# Patient Record
Sex: Male | Born: 1970 | Hispanic: No | Marital: Married | Smoking: Current some day smoker
Health system: Southern US, Community
[De-identification: ages and names within clinical notes are randomized; demographics above are authoritative.]

## PROBLEM LIST (undated history)

## (undated) DIAGNOSIS — E13319 Other specified diabetes mellitus with unspecified diabetic retinopathy without macular edema: Secondary | ICD-10-CM

## (undated) DIAGNOSIS — M48061 Spinal stenosis, lumbar region without neurogenic claudication: Secondary | ICD-10-CM

## (undated) DIAGNOSIS — J45909 Unspecified asthma, uncomplicated: Secondary | ICD-10-CM

## (undated) DIAGNOSIS — M4712 Other spondylosis with myelopathy, cervical region: Secondary | ICD-10-CM

## (undated) DIAGNOSIS — I2510 Atherosclerotic heart disease of native coronary artery without angina pectoris: Secondary | ICD-10-CM

## (undated) DIAGNOSIS — T7840XA Allergy, unspecified, initial encounter: Secondary | ICD-10-CM

## (undated) DIAGNOSIS — M940 Chondrocostal junction syndrome [Tietze]: Secondary | ICD-10-CM

## (undated) DIAGNOSIS — U071 COVID-19: Secondary | ICD-10-CM

## (undated) DIAGNOSIS — G4700 Insomnia, unspecified: Secondary | ICD-10-CM

## (undated) DIAGNOSIS — IMO0002 Reserved for concepts with insufficient information to code with codable children: Secondary | ICD-10-CM

## (undated) HISTORY — PX: TOOTH EXTRACTION: SHX859

## (undated) HISTORY — DX: Reserved for concepts with insufficient information to code with codable children: IMO0002

## (undated) HISTORY — DX: Allergy, unspecified, initial encounter: T78.40XA

## (undated) HISTORY — DX: Other specified diabetes mellitus with unspecified diabetic retinopathy without macular edema: E13.319

## (undated) HISTORY — DX: Insomnia, unspecified: G47.00

## (undated) HISTORY — DX: Spinal stenosis, lumbar region without neurogenic claudication: M48.061

## (undated) HISTORY — PX: SPHINCTEROTOMY: SHX5279

---
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
## 2002-11-20 ENCOUNTER — Emergency Department (HOSPITAL_COMMUNITY): Admission: EM | Admit: 2002-11-20 | Discharge: 2002-11-20 | Payer: Self-pay | Admitting: Podiatry

## 2009-03-23 ENCOUNTER — Emergency Department: Payer: Self-pay | Admitting: Emergency Medicine

## 2009-04-13 ENCOUNTER — Emergency Department: Payer: Self-pay | Admitting: Emergency Medicine

## 2009-05-12 ENCOUNTER — Emergency Department: Payer: Self-pay | Admitting: Emergency Medicine

## 2009-05-27 ENCOUNTER — Emergency Department: Payer: Self-pay | Admitting: Emergency Medicine

## 2009-07-26 ENCOUNTER — Emergency Department: Payer: Self-pay | Admitting: Emergency Medicine

## 2009-09-01 ENCOUNTER — Emergency Department: Payer: Self-pay | Admitting: Emergency Medicine

## 2009-10-19 ENCOUNTER — Emergency Department: Payer: Self-pay | Admitting: Unknown Physician Specialty

## 2009-10-22 ENCOUNTER — Emergency Department: Payer: Self-pay | Admitting: Emergency Medicine

## 2010-01-17 ENCOUNTER — Ambulatory Visit: Payer: Self-pay | Admitting: Family

## 2010-02-26 ENCOUNTER — Emergency Department: Payer: Self-pay | Admitting: Emergency Medicine

## 2010-03-13 ENCOUNTER — Ambulatory Visit: Payer: Self-pay | Admitting: Internal Medicine

## 2010-05-08 ENCOUNTER — Emergency Department: Payer: Self-pay | Admitting: Emergency Medicine

## 2010-06-03 ENCOUNTER — Ambulatory Visit: Payer: Self-pay | Admitting: Internal Medicine

## 2010-11-17 ENCOUNTER — Emergency Department: Payer: Self-pay | Admitting: Emergency Medicine

## 2010-11-20 HISTORY — PX: EXCISIONAL HEMORRHOIDECTOMY: SHX1541

## 2010-11-30 ENCOUNTER — Encounter
Admission: RE | Admit: 2010-11-30 | Discharge: 2010-11-30 | Payer: Self-pay | Source: Home / Self Care | Attending: Unknown Physician Specialty | Admitting: Unknown Physician Specialty

## 2011-02-09 ENCOUNTER — Ambulatory Visit: Payer: Self-pay | Admitting: Internal Medicine

## 2011-03-21 ENCOUNTER — Ambulatory Visit: Payer: Self-pay | Admitting: Unknown Physician Specialty

## 2011-04-11 ENCOUNTER — Emergency Department: Payer: Self-pay | Admitting: Emergency Medicine

## 2011-05-10 ENCOUNTER — Emergency Department: Payer: Self-pay | Admitting: Unknown Physician Specialty

## 2011-05-21 ENCOUNTER — Emergency Department: Payer: Self-pay | Admitting: Emergency Medicine

## 2011-06-07 ENCOUNTER — Emergency Department: Payer: Self-pay | Admitting: Emergency Medicine

## 2011-06-19 ENCOUNTER — Emergency Department: Payer: Self-pay | Admitting: *Deleted

## 2011-09-25 ENCOUNTER — Emergency Department: Payer: Self-pay | Admitting: Emergency Medicine

## 2011-09-29 ENCOUNTER — Ambulatory Visit: Payer: Self-pay | Admitting: Physical Medicine & Rehabilitation

## 2011-09-29 ENCOUNTER — Encounter: Payer: Worker's Compensation | Attending: Physical Medicine & Rehabilitation

## 2011-09-29 DIAGNOSIS — M545 Low back pain, unspecified: Secondary | ICD-10-CM | POA: Insufficient documentation

## 2011-09-29 DIAGNOSIS — M5137 Other intervertebral disc degeneration, lumbosacral region: Secondary | ICD-10-CM | POA: Insufficient documentation

## 2011-09-29 DIAGNOSIS — M79609 Pain in unspecified limb: Secondary | ICD-10-CM | POA: Insufficient documentation

## 2011-09-29 DIAGNOSIS — M5430 Sciatica, unspecified side: Secondary | ICD-10-CM

## 2011-09-29 DIAGNOSIS — M51379 Other intervertebral disc degeneration, lumbosacral region without mention of lumbar back pain or lower extremity pain: Secondary | ICD-10-CM | POA: Insufficient documentation

## 2011-09-29 DIAGNOSIS — M5126 Other intervertebral disc displacement, lumbar region: Secondary | ICD-10-CM | POA: Insufficient documentation

## 2011-09-29 DIAGNOSIS — E119 Type 2 diabetes mellitus without complications: Secondary | ICD-10-CM | POA: Insufficient documentation

## 2011-10-02 NOTE — Consult Note (Signed)
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  He initially was treated in the [facility], but then followed up at the [facility].  At [facility], he saw Dr. Ruthann Cancer.  He was treated with physical therapy as well as medications.  He had an MRI at [facility], Mar 21, 2011, degenerative disk disease L4-5 and L5-S1, mild neural foraminal narrowing at L5-S1.  This was referred to Dr. Golda Acre note from Apr 03, 2011.  He received tramadol as well as Flexeril for pain. His relief from Flexeril was not particularly good, tramadol was helpful, although, when he kept on it for a long period of time, it became less effective.  He was also seen by Dr. Merri Ray from Physical Medicine Rehabilitation.  He was recommended to go back to light duty work on March 30.  He had an MRI at [facility], October 19, 2010 showing small central L4-5 disk protrusion, mild central canal narrowing, shallow rightward eccentric L5-S1 disk bulge without canal or foraminal stenosis.  The patient reportedly had an FCE done some time after he saw Dr. Yves Dill.  The report is not available to me at this time.  He did have an epidural injection performed, November 30, 2010 at [facility], L4-5 translaminar approach, right paramedian.  He states that he developed numbness several hours later which made it difficult for him to walk in the grocery store with his wife.  The patient's complaints with Dr. Yves Dill were mainly axial back pain as well as radiculitis with Dr. Gerrit Heck, mainly axial back pain.  Other treatments included diclofenac which was not helpful, Celebrex mild relief, Skelaxin no relief, Flexeril per Dr. Yves Dill is good relief but then he states that today did not have good relief, tramadol some relief.  He had constipation with Percocet.  I have  also reviewed notes from [facility] in 2011.  He also had a neurosurgical evaluation with Dr. Dorian Heckle on July 10, 2011.  Surgery was not recommended. MRI was described as mild lumbar degenerative disk changes without overt compressive structural abnormality.  The patient's current pain level is 7, but averages 5, described as sharp, burning, dull, stabbing, tingling, aching.  He indicates his right leg has numbness and left leg has pain down to the foot area.  He can walk 20 minutes at a time.  He climbs steps.  He drives.  He needs help with certain household duties and addressing, now although he was able to dress and undress himself in the exam room today.  His last date of work was November 02, 2010.  REVIEW OF SYSTEMS:  Today, weakness numbness tingling, trouble walking, spasms, night sweats, weight loss, although, weight is still over 250 pounds.  PAST MEDICAL HISTORY:  Diabetes, although he states it is well controlled.  Hemoglobin A1c around 6.  SOCIAL HISTORY:  Separated.  Smokes 2 packs per day.  FAMILY HISTORY:  Heart disease, lung disease, diabetes, hypertension, drug abuse, and disability, as well as alcohol abuse.  Blood pressure 125/75, pulse 81, respirations 16, O2 saturation 97% on room air.  His opioid risk tool score based on family history of alcohol and illegal drug use as well as his age is 7, putting him at moderate risk for problematic behaviors.  PAST SURGICAL HISTORY:  Right femur fracture status post  ORIF when he was 14.  PHYSICAL EXAMINATION:  VITAL SIGNS:  Blood pressure 125/75, pulse 81, respirations 16, O2 saturation 97% on room air. GENERAL:  No acute distress.  Orientation x3.  Affect alert.  Gait is with a limp. EXTREMITIES:  Without edema.  He has decreased sensation at right L3, L4, L5 dermatomal distribution.  He has deep tendon reflexes 1+ right patellar, 2+ left patella, and 1+ bilateral  ankles. MUSCULOSKELETAL:  His straight leg raising test is negative bilaterally. Strength is normal bilateral lower extremity, hip, knee.  In ankle, range of motion are normal.  Back range of motion is normal.  He has no tenderness to palpation in lumbar paraspinal musculature.  His gait shows no evidence of toe drag or knee instability.  He is able to toe walk and heel walk.  Upper extremity strength is normal.  Neck and upper back have no tenderness to palpation.  IMPRESSION:  Mild lumbar degenerative disk, primarily axial back pain but also some sciatic type symptoms.  Given his history of diabetes, he may have superimposed neuropathy.  RECOMMENDATIONS: 1. We will check EMG and CV.  This can be done by myself either at the     [facility] or at [facility]. 2. I would like to review the FCE, but until that time I think he     would be safe for return to work at a sedentary level at least     pending my review. 3. In terms of medications, we will minimize use on more p.r.n. basis.     He states that his pain is really not on daily basis, but more     intermittent, and for this reason, we will start him on tramadol 50     mg b.i.d. and Robaxin 500 b.i.d. on the days where he does have     pain that limits his activity level.  I discussed with the patient and he agrees with plan.  I would like to get all of his MRI reports, he states he has had several, as well as the FCE will request from insurance company.     Erick Colace, M.D. Electronically Signed    AEK/MedQ D:09/29/2011 16:44:24  T:09/29/2011 20:03:06  Job #:  [id number]  cc:   Danae Orleans. Venetia Maxon, M.D. Fax: [phone]  Jefferey Pica Fax:  [phone]  Ruthann Cancer, MD

## 2011-10-25 ENCOUNTER — Encounter: Payer: Worker's Compensation | Attending: Neurosurgery | Admitting: Neurosurgery

## 2011-10-25 DIAGNOSIS — E119 Type 2 diabetes mellitus without complications: Secondary | ICD-10-CM | POA: Insufficient documentation

## 2011-10-25 DIAGNOSIS — M545 Low back pain, unspecified: Secondary | ICD-10-CM | POA: Insufficient documentation

## 2011-10-25 DIAGNOSIS — M51379 Other intervertebral disc degeneration, lumbosacral region without mention of lumbar back pain or lower extremity pain: Secondary | ICD-10-CM | POA: Insufficient documentation

## 2011-10-25 DIAGNOSIS — M5126 Other intervertebral disc displacement, lumbar region: Secondary | ICD-10-CM | POA: Insufficient documentation

## 2011-10-25 DIAGNOSIS — M79609 Pain in unspecified limb: Secondary | ICD-10-CM | POA: Insufficient documentation

## 2011-10-25 DIAGNOSIS — M5137 Other intervertebral disc degeneration, lumbosacral region: Secondary | ICD-10-CM | POA: Insufficient documentation

## 2011-10-25 DIAGNOSIS — M5106 Intervertebral disc disorders with myelopathy, lumbar region: Secondary | ICD-10-CM

## 2011-10-26 NOTE — Assessment & Plan Note (Signed)
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  He has been seen at the [facility] as well as evaluated by Dr. [name], [facility], who deemed his problem is nonsurgical.  He has had multiple MRIs, the last one in May of this year is not available for review here, but reading of Dr. [name] note, there is some mild disk bulging at L4-5 and L5-S1 that is nonoperable, no written encouragement was mentioned even though the patient says that the MRI reports of the nerve root at L5-S1 is compromised.  I do not have that documentation in the chart.  The patient states he has got an appointment with Dr. [name] on the 14th, but he asked to be seen due to his low back hurting worse.  We are not prescribing narcotics.  He rates his average pain is 7 to a 10.  It is a sharp, burning, stabbing, tingling, and aching pain.  General activity levels are 10.  Pain is worse in the morning.  Sleep patterns are poor.  All activities are aggravate.  Heat and medication helps.  He walks without assistance.  He does drive.  He did not climb steps.  He is employed as a Corporate treasurer and states he has been cleared to go back to work on a sedentary basis, but has not done so yet.  REVIEW OF SYSTEMS:  Notable for difficulties described above as well as some bladder control issues, weakness, trouble walking, spasms, high blood sugars, weight gain, night sweats, urinary retention, poor appetite, cough, shortness of breath, and wheezing.  His past medical history, social history, and family history are unchanged.  PHYSICAL EXAMINATION:  His blood pressure is 131/81, pulse 88, respirations 16, O2 sats 99 on room air.  His motor strength and sensation appeared to be intact in the lower extremities.  Straight leg raising is negative.  He does appear to be slow to rise from a seated position.  I discussed the patient's past medical evaluations with him. He  states that he is aggravated "with the whole situation."  He wants to know why he is hurting so bad.  I told him I could not explain that given his clinical findings.  Also once in a while, we will prescribe narcotics and I told him that it was also based on clinical findings and diagnostics, which did not warrant narcotics in his condition.  He has been tried on several anti-inflammatory as well as anti-spasmodic medicines in the past.  He states the oxycodone is the only thing that really helped.  He states tramadol does help, but after he "get used to it, it does not help anymore."  He states that the Robaxin does not help, but he has got Relafen onboard now.  I told him that I would switch him from the Robaxin back to [facility], which he agreed to.  We are going to refill his tramadol.  I did offer to send him to Dr. Donalee Citrin for second surgical opinion, which he reluctantly agreed to, and he states that when his EMG nerve conduction study was completed with Dr. Cherie Ouch office that he was told it was positive finding and then it was told that they "repositioned him" to get the findings as they wanted, I am not sure exactly what that means.  Reading the report is a normal EEG study.  IMPRESSION:  Mild lumbar degenerative disk disease, question musculoskeletal back pain, unknown etiology.  PLAN: 1. Refill tramadol 50 mg 1 p.o. b.i.d. p.r.n. 60 with no refill. 2. Flexeril 10 mg 1 p.o. q.8 hours p.r.n. 90 with 1 refill.  I tried     to answer all of his questions the best of my ability.  We will     follow him up as scheduled with Dr. [name].  Hopefully, I will     able to get to see Dr. [name] before that time.     Lesley Galentine L. Blima Dessert Electronically Signed    RLW/MedQ D:  10/25/2011 14:41:05  T:  10/26/2011 16:10:96  Job #:  [id number]

## 2011-11-03 ENCOUNTER — Ambulatory Visit: Payer: Worker's Compensation | Admitting: Physical Medicine & Rehabilitation

## 2011-11-07 ENCOUNTER — Emergency Department: Payer: Self-pay | Admitting: Emergency Medicine

## 2011-11-28 ENCOUNTER — Ambulatory Visit: Payer: Worker's Compensation | Admitting: Physical Medicine & Rehabilitation

## 2011-11-28 ENCOUNTER — Encounter: Payer: Worker's Compensation | Attending: Physical Medicine & Rehabilitation

## 2011-11-28 DIAGNOSIS — M51379 Other intervertebral disc degeneration, lumbosacral region without mention of lumbar back pain or lower extremity pain: Secondary | ICD-10-CM | POA: Insufficient documentation

## 2011-11-28 DIAGNOSIS — M5126 Other intervertebral disc displacement, lumbar region: Secondary | ICD-10-CM | POA: Insufficient documentation

## 2011-11-28 DIAGNOSIS — M5137 Other intervertebral disc degeneration, lumbosacral region: Secondary | ICD-10-CM | POA: Insufficient documentation

## 2011-11-28 DIAGNOSIS — M79609 Pain in unspecified limb: Secondary | ICD-10-CM | POA: Insufficient documentation

## 2011-11-28 DIAGNOSIS — M545 Low back pain, unspecified: Secondary | ICD-10-CM | POA: Insufficient documentation

## 2011-11-28 DIAGNOSIS — E119 Type 2 diabetes mellitus without complications: Secondary | ICD-10-CM | POA: Insufficient documentation

## 2011-11-28 NOTE — Assessment & Plan Note (Signed)
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  He was initially treated at [facility] and followed at the [facility].  At [facility], he saw Dr. Ruthann Cancer.  He was treated with physical therapy as well as medications.  He had a MRI [facility] Mar 21, 2011, degenerative disk disease L4-5, L5-S1, mild neural foraminal narrowing L5-S1.  He actually brought in the report from this, and I was able to review it today.  It did show neural foraminal narrowing which were mild and right greater than left side.  The patient states he has had several episodes, where his left leg seemed to get weak.  He did have an EMG/NCV per Dr. Clarisse Gouge and this showed no abnormalities.  We discussed these results at length.  The patient has had NSCE in the past, although I do not have the results of this.  He has had an epidural injection L4-5 translaminar right paramedian approach, which he indicates caused some numbness several hours later.  He has had some good relief with Flexeril in the past. Tramadol had some good relief.  He states he has not been able to tolerate Percocet due to constipation and he also mentions that it causes sexual dysfunction.  PAST MEDICAL HISTORY:  Diabetes.  Hemoglobin A1c at 6 indicating good control.  SOCIAL HISTORY:  He has complicated social situation.  He recently separated from his wife, father as well as uncle died over the last year.  Other family members have died as well and he states that this has caused some poor sleep for him.  I did actually find the FCE reviewed valid reliability.  The patient okayed for light medium duty, occasional squat, occasional stand.  He is currently employed as a Public relations account executive, but is on restricted duty.  His walking tolerance is 10 minutes.  He is able to climb steps. He is able to drive.  REVIEW OF  SYSTEMS:  Positive for weakness, numbness, tingling, trouble walking, spasms, blood sugar regulation problems, weight loss which is welcomed in his situation, coughing and wheezing.  Height 5 feet, 9 inches.  FAMILY HISTORY:  Heart disease, lung disease, diabetes and hypertension.  OBJECTIVE:  VITAL SIGNS:  Blood pressure 153/86, pulse 96, respiratory rate is 16 and O2 sat 98% on room air.  GENERAL:  No acute distress. Mood and affect appropriate. BACK:  His back has tenderness with even very light palpation of his lumbosacral spine bilaterally.  He has negative straight leg raising test.  He has normal deep tendon reflexes.  Normal strength bilateral lower extremities.  Lower extremity range of motion normal.  Gait is normal.  Back range of motion is normal except for extension which is limited.  Mood and affect are flat.  IMPRESSION: 1. Lumbar degenerative disk, mild.  No evidence of lumbar     radiculopathy to explain his lower extremity symptoms.  He may have     some radiating pain from paraspinal muscles versus disk that is not     radicular. 2. The patient lists multiple social stressors, separation from wife,     deaths of family members.  I do think he would benefit from Pain     Psychology to evaluate coping mechanisms. 3. In terms of pain medications thus far, his best combination     medications have been tramadol and Flexeril.  We  will increase his tramadol 50 mg 2 p.o. t.i.d. p.r.n. as well as     Flexeril 10 mg p.o. t.i.d. p.r.n. I will see him back in about 6     weeks.  He will continue his current work activities.  Discussed     with the patient, agrees with plan.     Erick Colace, M.D. Electronically Signed    AEK/MedQ D:  11/28/2011 16:31:56  T:  11/28/2011 22:53:47  Job #:  [id number]  cc:   Danae Orleans. Venetia Maxon, M.D. Fax: [phone]

## 2011-12-03 ENCOUNTER — Emergency Department: Payer: Self-pay | Admitting: Emergency Medicine

## 2011-12-19 ENCOUNTER — Emergency Department: Payer: Self-pay | Admitting: Emergency Medicine

## 2012-01-08 ENCOUNTER — Encounter: Payer: Worker's Compensation | Attending: Physical Medicine & Rehabilitation

## 2012-01-08 ENCOUNTER — Ambulatory Visit: Payer: Worker's Compensation | Admitting: Physical Medicine & Rehabilitation

## 2012-01-08 DIAGNOSIS — M5137 Other intervertebral disc degeneration, lumbosacral region: Secondary | ICD-10-CM

## 2012-01-08 DIAGNOSIS — M545 Low back pain, unspecified: Secondary | ICD-10-CM | POA: Insufficient documentation

## 2012-01-08 DIAGNOSIS — M5126 Other intervertebral disc displacement, lumbar region: Secondary | ICD-10-CM | POA: Insufficient documentation

## 2012-01-08 DIAGNOSIS — M79609 Pain in unspecified limb: Secondary | ICD-10-CM | POA: Insufficient documentation

## 2012-01-08 DIAGNOSIS — M51379 Other intervertebral disc degeneration, lumbosacral region without mention of lumbar back pain or lower extremity pain: Secondary | ICD-10-CM | POA: Insufficient documentation

## 2012-01-08 DIAGNOSIS — E119 Type 2 diabetes mellitus without complications: Secondary | ICD-10-CM | POA: Insufficient documentation

## 2012-01-08 NOTE — Assessment & Plan Note (Signed)
Joel Page is a 41 year old male with back pain as well as lower extremity pain.  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  He was treated initially at the [facility] followed at the [facility]  subsequently, Dr. Gerrit Heck from Spine surgery saw him.  No surgical recommendations were made.  He was seen by physical medicine and rehabilitation, Dr. Merri Ray.  Okayed for light duty work on February 17, 2011.  MRI imaging was performed on October 19, 2010, as well as May of 2012.  Has had an FCE done as well.  He did have an epidural injection done at [facility] L4-5 translaminar injection right paramedian approach.  He had Neurosurgical evaluation on July 10, 2011.  No surgery was recommended.  I reviewed his MRIs, no significant stenotic lesions on his lumbar MRI, dated Mar 21, 2011.  Degenerative disk at L4-5, mild broad-based disk bulging, some lateralization of the L5-S1 disk bulged to the right versus the left side.  His current complaints, he feels like his right lower extremity is getting more painful.  His left lower extremity has a tired feeling.  He is now experiencing some nighttime pain.  He generally gets good relief from his tramadol 50 two p.o. b.i.d. as well as Flexeril 10 mg t.i.d. p.r.n., but his nighttime pain is something that is bothering him more. He has some problems with household duties.  Otherwise, independent, has some chronic intermittent urinary frequency problems.  SOCIAL HISTORY:  Single, lives alone, smokes.  PHYSICAL EXAMINATION:  VITAL SIGNS:  Blood pressure 125/60, pulse 91, respirations 16, weight 248 pounds, height 5 feet, 9 inches, overweight male, in no acute distress. GENERAL:  Orientation x3.  Affect is alert. MUSCULOSKELETAL:  Gait is with a limp.  His lower extremity strength is 5/5 in the hip flexors, knee extensors.  He has some giveaway weakness, ankle dorsiflexor on the right as well as the EHL  on the left side is normal.  He has pain that goes down his back of the thigh with SLR on the right side in the seated position.  Negative bowstring sign. Negative contralateral SLR.  Hip, knee, and ankle range of motion are full.  Gait is normal.  He has tenderness to palpation with even very light palpation in the lumbar spine.  IMPRESSION: 1. Lumbar pain, lumbar degenerative disk.  Previous imaging studies     and EMG showed no evidence of nerve root compromise.  He has some     increased subjective symptoms at the current time.  We will address     with medications change.  We will add nortriptyline 25 at bedtime,     this should help with sleep as well as pain of chronic nature. 2. Continue his tramadol. 3. Continue Flexeril. 4. I will see him back in a month.  If he is really not feeling any     better, we will repeat MRI.  There was no changes between 2011 and     2012.  We will repeat again if no better with medication change.     Erick Colace, M.D. Electronically Signed   AEK/MedQ D:  01/08/2012 13:19:20  T:  01/08/2012 21:07:08  Job #:  [id number]  cc:   [employer] [phone] Lanetta Inch

## 2012-01-11 ENCOUNTER — Ambulatory Visit: Payer: Self-pay | Admitting: Surgery

## 2012-01-11 LAB — BASIC METABOLIC PANEL
Anion Gap: 11 (ref 7–16)
BUN: 10 mg/dL (ref 7–18)
Calcium, Total: 8.9 mg/dL (ref 8.5–10.1)
Chloride: 105 mmol/L (ref 98–107)
EGFR (Non-African Amer.): >60
Glucose: 125 mg/dL — ABNORMAL HIGH (ref 65–99)
Osmolality: 284 (ref 275–301)

## 2012-01-11 LAB — CBC WITH DIFFERENTIAL/PLATELET
Basophil #: 0.1 10*3/uL (ref 0.0–0.1)
Eosinophil %: 1.7 %
HCT: 44.3 % (ref 40.0–52.0)
HGB: 15.1 g/dL (ref 13.0–18.0)
Lymphocyte #: 2.2 10*3/uL (ref 1.0–3.6)
Lymphocyte %: 18.4 %
MCHC: 34.1 g/dL (ref 32.0–36.0)
MCV: 87 fL (ref 80–100)
Monocyte #: 0.7 10*3/uL (ref 0.0–0.7)
Monocyte %: 5.4 %
Neutrophil %: 73.6 %
Platelet: 164 10*3/uL (ref 150–440)
RBC: 5.1 10*6/uL (ref 4.40–5.90)
RDW: 13.9 % (ref 11.5–14.5)
WBC: 12.1 10*3/uL — ABNORMAL HIGH (ref 3.8–10.6)

## 2012-01-16 ENCOUNTER — Emergency Department: Payer: Self-pay | Admitting: Emergency Medicine

## 2012-02-05 ENCOUNTER — Ambulatory Visit: Payer: Self-pay | Admitting: Surgery

## 2012-02-06 ENCOUNTER — Encounter: Payer: Worker's Compensation | Attending: Physical Medicine & Rehabilitation

## 2012-02-06 ENCOUNTER — Ambulatory Visit: Payer: Worker's Compensation | Admitting: Physical Medicine & Rehabilitation

## 2012-02-06 DIAGNOSIS — M545 Low back pain, unspecified: Secondary | ICD-10-CM | POA: Insufficient documentation

## 2012-02-06 DIAGNOSIS — M5137 Other intervertebral disc degeneration, lumbosacral region: Secondary | ICD-10-CM | POA: Insufficient documentation

## 2012-02-06 DIAGNOSIS — E119 Type 2 diabetes mellitus without complications: Secondary | ICD-10-CM | POA: Insufficient documentation

## 2012-02-06 DIAGNOSIS — M51379 Other intervertebral disc degeneration, lumbosacral region without mention of lumbar back pain or lower extremity pain: Secondary | ICD-10-CM | POA: Insufficient documentation

## 2012-02-06 DIAGNOSIS — M79609 Pain in unspecified limb: Secondary | ICD-10-CM | POA: Insufficient documentation

## 2012-02-06 DIAGNOSIS — M5126 Other intervertebral disc displacement, lumbar region: Secondary | ICD-10-CM | POA: Insufficient documentation

## 2012-03-19 ENCOUNTER — Encounter: Payer: Worker's Compensation | Attending: Physical Medicine & Rehabilitation

## 2012-03-19 ENCOUNTER — Encounter: Payer: Self-pay | Admitting: Physical Medicine & Rehabilitation

## 2012-03-19 ENCOUNTER — Ambulatory Visit (HOSPITAL_BASED_OUTPATIENT_CLINIC_OR_DEPARTMENT_OTHER): Payer: Worker's Compensation | Admitting: Physical Medicine & Rehabilitation

## 2012-03-19 VITALS — BP 114/73 | HR 97 | Ht 69.0 in | Wt 232.0 lb

## 2012-03-19 DIAGNOSIS — M5137 Other intervertebral disc degeneration, lumbosacral region: Secondary | ICD-10-CM

## 2012-03-19 DIAGNOSIS — M5430 Sciatica, unspecified side: Secondary | ICD-10-CM | POA: Insufficient documentation

## 2012-03-19 DIAGNOSIS — E119 Type 2 diabetes mellitus without complications: Secondary | ICD-10-CM | POA: Insufficient documentation

## 2012-03-19 DIAGNOSIS — M545 Low back pain, unspecified: Secondary | ICD-10-CM | POA: Insufficient documentation

## 2012-03-19 DIAGNOSIS — M51379 Other intervertebral disc degeneration, lumbosacral region without mention of lumbar back pain or lower extremity pain: Secondary | ICD-10-CM | POA: Insufficient documentation

## 2012-03-19 DIAGNOSIS — M5126 Other intervertebral disc displacement, lumbar region: Secondary | ICD-10-CM | POA: Insufficient documentation

## 2012-03-19 DIAGNOSIS — M79609 Pain in unspecified limb: Secondary | ICD-10-CM | POA: Insufficient documentation

## 2012-03-19 DIAGNOSIS — M5136 Other intervertebral disc degeneration, lumbar region: Secondary | ICD-10-CM | POA: Insufficient documentation

## 2012-03-19 DIAGNOSIS — M51369 Other intervertebral disc degeneration, lumbar region without mention of lumbar back pain or lower extremity pain: Secondary | ICD-10-CM | POA: Insufficient documentation

## 2012-03-19 MED ORDER — TRAMADOL HCL 50 MG PO TABS: 100.0000 mg | ORAL_TABLET | Freq: Three times a day (TID) | ORAL | Status: DC | PRN

## 2012-03-19 MED ORDER — TRAZODONE HCL 50 MG PO TABS: 50.0000 mg | ORAL_TABLET | Freq: Every day | ORAL | Status: DC

## 2012-03-19 MED ORDER — CYCLOBENZAPRINE HCL 10 MG PO TABS: 10.0000 mg | ORAL_TABLET | Freq: Three times a day (TID) | ORAL | Status: DC | PRN

## 2012-03-19 NOTE — Patient Instructions (Addendum)
Discontinue the nortriptyline due to the urinary symptoms Trazodone will be substituted for sleep as well as for pain. Continue tramadol 2 tablets 3 times a day Continue Flexeril 10 mg every 8 hours as needed. Do not take this if you don't have muscle spasms

## 2012-03-19 NOTE — Progress Notes (Signed)
Subjective:    Patient ID: Joel Page, male    DOB: Jan 10, 1971, 41 y.o.   MRN: [id number]  HPI  [name] is a 41 year old male with back pain as well as lower extremity  pain. 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 He was  treated initially at the [facility] followed at the  [facility] subsequently, Dr. Gerrit Heck from Spine surgery saw him. No  surgical recommendations were made. He was seen by physical medicine  and rehabilitation, Dr. Merri Ray. Okayed for light duty work on  February 17, 2011. MRI imaging was performed on October 19, 2010, as well  as May of 2012. Has had an FCE done as well. He did have an epidural  injection done at [facility] L4-5 translaminar injection right  paramedian approach. He had Neurosurgical evaluation on July 10, 2011. No surgery was recommended.  I reviewed his MRIs, no significant stenotic lesions on his lumbar MRI,  dated Mar 21, 2011. Degenerative disk at L4-5, mild broad-based disk  bulging, some lateralization of the L5-S1 disk bulged to the right  versus the left side.   Pain Inventory Average Pain 7 Pain Right Now 9 My pain is intermittent, sharp and aching  In the last 24 hours, has pain interfered with the following? General activity 9 Relation with others 8 Enjoyment of life 9 What TIME of day is your pain at its worst? morning Sleep (in general) Poor  Pain is worse with: walking, bending, sitting, inactivity and standing Pain improves with: heat/ice and medication Relief from Meds: 3  Mobility walk without assistance how many minutes can you walk? 5 min ability to climb steps?  yes do you drive?  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  no CT/MRI nerve study  Physicians involved in your  care Any changes since last visit?  no        Review of Systems  Constitutional: Negative.        High blood sugar,  HENT: Negative.   Eyes: Negative.   Respiratory: Positive for cough.   Cardiovascular: Negative.   Gastrointestinal: Negative.   Genitourinary: Positive for difficulty urinating.  Musculoskeletal: Negative.   Skin: Negative.   Neurological: Positive for numbness.  Hematological: Negative.   Psychiatric/Behavioral: Positive for dysphoric mood.       Objective:   Physical Exam  Constitutional: He is oriented to person, place, and time.  Neurological: He is alert and oriented to person, place, and time. A sensory deficit is present. Gait normal.  Reflex Scores:      Patellar reflexes are 1+ on the right side and 2+ on the left side.      Achilles reflexes are 1+ on the right side and 1+ on the left side.      Diminished pinprick sensation right L4-L5-S1 distribution Straight leg raising negative Motor strength is 5/5 with the exception of 4/5 ankle dorsiflexor on the right and give way weakness.      Assessment & Plan:  1. Lumbar generative disc L4-5. He has some subjective signs of numbness in the right foot which are chronic but he also has new or object of findings with reduced right patellar reflex. Prior EMG was negative. I think it's reasonable to repeat the MRI at this point given that he is getting  some more objective findings on his examination. In terms of his medications I believe he is getting some urinary hesitancy from the Pamelor we'll discontinue that and substitute trazodone 50 mg each bedtime. In addition we'll continue his tramadol 100 mg 3 times per day and the cyclobenzaprine as well. He is no longer taking the methocarbamol.

## 2012-03-28 ENCOUNTER — Ambulatory Visit
Admission: RE | Admit: 2012-03-28 | Discharge: 2012-03-28 | Disposition: A | Discharge status: Home / Self Care | Payer: Worker's Compensation | Source: Ambulatory Visit | Attending: Physical Medicine & Rehabilitation | Admitting: Physical Medicine & Rehabilitation

## 2012-03-28 DIAGNOSIS — M5430 Sciatica, unspecified side: Secondary | ICD-10-CM

## 2012-03-29 ENCOUNTER — Other Ambulatory Visit: Payer: Self-pay

## 2012-03-29 ENCOUNTER — Telehealth: Payer: Self-pay | Admitting: Physical Medicine & Rehabilitation

## 2012-03-29 MED ORDER — TOPIRAMATE 25 MG PO TABS: 25.0000 mg | ORAL_TABLET | Freq: Two times a day (BID) | ORAL | Status: DC

## 2012-03-29 NOTE — Telephone Encounter (Signed)
Lm informing pt of medication and injection.

## 2012-03-29 NOTE — Telephone Encounter (Signed)
Had MRI.  Still in lot of pain.  Does Dr want him to come in or can he give him stronger pain med?

## 2012-03-29 NOTE — Telephone Encounter (Signed)
Schedule for R L4-5 transforaminal injection  Start topamax 25 mg po BID #60

## 2012-03-29 NOTE — Telephone Encounter (Signed)
Please advise.  Pt aware.

## 2012-04-02 ENCOUNTER — Telehealth: Payer: Self-pay | Admitting: *Deleted

## 2012-04-02 NOTE — Telephone Encounter (Signed)
Pt requested work note from 03/25/12 until 04/01/12.  He had MRI done last week and is still waiting on workers comp to approve medication. Please advise.

## 2012-04-02 NOTE — Telephone Encounter (Signed)
Needs to speak to a clinic staff member. Please call.

## 2012-04-04 NOTE — Telephone Encounter (Signed)
The patient can still do sedentary duty. I can write a work note to limit him to sedentary duty.

## 2012-04-05 NOTE — Telephone Encounter (Signed)
Pt aware that work note is ready for pickup  

## 2012-04-09 ENCOUNTER — Encounter: Payer: Self-pay | Admitting: Physical Medicine & Rehabilitation

## 2012-04-09 ENCOUNTER — Ambulatory Visit (HOSPITAL_BASED_OUTPATIENT_CLINIC_OR_DEPARTMENT_OTHER): Payer: Worker's Compensation | Admitting: Physical Medicine & Rehabilitation

## 2012-04-09 VITALS — BP 126/60 | HR 109 | Resp 16 | Ht 69.0 in | Wt 231.0 lb

## 2012-04-09 DIAGNOSIS — M545 Low back pain, unspecified: Secondary | ICD-10-CM | POA: Insufficient documentation

## 2012-04-09 DIAGNOSIS — M5416 Radiculopathy, lumbar region: Secondary | ICD-10-CM

## 2012-04-09 DIAGNOSIS — IMO0002 Reserved for concepts with insufficient information to code with codable children: Secondary | ICD-10-CM

## 2012-04-09 NOTE — Progress Notes (Signed)
  PROCEDURE RECORD [facility]   Name: Joel Page DOB:Aug 23, 1971 MRN: [id number]  Date:04/09/2012  Physician: Claudette Laws, MD    Nurse/CMA: Redgie Grayer  Allergies:  Allergies  Allergen Reactions  . Diclofenac Sodium   . Neurontin (Gabapentin) Other (See Comments)    CHEST PAINS    Consent Signed: yes  Is patient diabetic? yes  CBG today? Didn't check this morning  Pregnant: no LMP: No LMP for male patient. (age 35-55)  Anticoagulants: no Anti-inflammatory: no Antibiotics: no  Procedure: Trans LESI  Position: Prone   RN/CMA Hampton Abbot    Time 12:40pm     BP 126/60     Pulse 109     Respirations 16     O2 Sat 97%     S/S 6     Pain Level 8/10      D/C home with Rodrigo Ran, patient A & O X 3, D/C instructions reviewed, and sits independently.

## 2012-04-09 NOTE — Patient Instructions (Signed)
[facility] radiology to do L4-L5 epidural with conscious sedation

## 2012-04-17 ENCOUNTER — Other Ambulatory Visit: Payer: Self-pay | Admitting: Physical Medicine & Rehabilitation

## 2012-04-17 ENCOUNTER — Telehealth: Payer: Self-pay | Admitting: Physical Medicine & Rehabilitation

## 2012-04-17 ENCOUNTER — Ambulatory Visit
Admission: RE | Admit: 2012-04-17 | Discharge: 2012-04-17 | Disposition: A | Discharge status: Home / Self Care | Payer: Worker's Compensation | Source: Ambulatory Visit | Attending: Physical Medicine & Rehabilitation | Admitting: Physical Medicine & Rehabilitation

## 2012-04-17 VITALS — BP 114/83 | HR 88

## 2012-04-17 DIAGNOSIS — M5430 Sciatica, unspecified side: Secondary | ICD-10-CM

## 2012-04-17 DIAGNOSIS — M5136 Other intervertebral disc degeneration, lumbar region: Secondary | ICD-10-CM

## 2012-04-17 DIAGNOSIS — M5416 Radiculopathy, lumbar region: Secondary | ICD-10-CM

## 2012-04-17 MED ORDER — IOHEXOL 180 MG/ML  SOLN
1.0000 mL | Freq: Once | INTRAMUSCULAR | Status: AC | PRN
  Administered 2012-04-17: 1 mL via EPIDURAL

## 2012-04-17 MED ORDER — DIAZEPAM 5 MG PO TABS
10.0000 mg | ORAL_TABLET | Freq: Once | ORAL | Status: AC
  Administered 2012-04-17: 10 mg via ORAL

## 2012-04-17 MED ORDER — METHYLPREDNISOLONE ACETATE 40 MG/ML INJ SUSP (RADIOLOG
120.0000 mg | Freq: Once | INTRAMUSCULAR | Status: AC
  Administered 2012-04-17: 120 mg via EPIDURAL

## 2012-04-17 NOTE — Progress Notes (Signed)
Girlfriend sitting beside patient in recovery area.  Patient says "it didn't suck this bad as last time."  jkl

## 2012-04-17 NOTE — Telephone Encounter (Signed)
Needs clarification on type of injection.  [facility] approved Nerve Root Block, but they have EPI.  Please call.

## 2012-04-17 NOTE — Discharge Instructions (Signed)

## 2012-04-19 ENCOUNTER — Encounter: Payer: Self-pay | Admitting: Physical Medicine and Rehabilitation

## 2012-04-19 ENCOUNTER — Ambulatory Visit: Payer: Worker's Compensation | Admitting: Physical Medicine & Rehabilitation

## 2012-04-19 ENCOUNTER — Encounter
Payer: Worker's Compensation | Attending: Physical Medicine & Rehabilitation | Admitting: Physical Medicine and Rehabilitation

## 2012-04-19 VITALS — BP 116/59 | HR 76 | Resp 16 | Ht 69.0 in | Wt 234.0 lb

## 2012-04-19 DIAGNOSIS — M545 Low back pain: Secondary | ICD-10-CM

## 2012-04-19 MED ORDER — TRAZODONE HCL 50 MG PO TABS: 50.0000 mg | ORAL_TABLET | Freq: Every day | ORAL | Status: DC

## 2012-04-19 NOTE — Progress Notes (Signed)
Subjective:    Patient ID: Joel Page, male    DOB: [birth year], 41 y.o.   MRN: [id number]  HPI The patient is a 41 year old  male,  With a Hx of LBP .  The patient complains about moderate to severe bilateral LBP pain   , which radiates into his LE in the L5 distribution bilateral. Patient also complains about numbness and tingling in the same distribution  . He  describes the pain as sharp, with muscle spasms   . Applying heat, taking medications , changing positions alleviate the symptoms. Prolonged standing , sitting   aggrevates the symptoms. The patient grades his pain as a 7-8  /10 on average. The patient reports that he had an epidural steroid injection at L4-5 done 2 days ago, which gave him 30% relief. Pain Inventory Average Pain 8 Pain Right Now 8 My pain is sharp, burning, stabbing, tingling and aching  In the last 24 hours, has pain interfered with the following? General activity 10 Relation with others 10 Enjoyment of life 10 What TIME of day is your pain at its worst? evening Sleep (in general) Poor  Pain is worse with: walking, bending, sitting, inactivity, standing and some activites Pain improves with: rest, heat/ice, pacing activities and medication Relief from Meds: 4  Mobility walk without assistance Do you have any goals in this area?  yes  Function employed # of hrs/week 40  Neuro/Psych bladder control problems bowel control problems weakness numbness tingling trouble walking spasms anxiety  Prior Studies Any changes since last visit?  no  Physicians involved in your care Any changes since last visit?  no   Family History  Problem Relation Age of Onset  . Cancer Mother   . Dementia Father   . COPD Father    History   Social History  . Marital Status: Legally Separated    Spouse Name: N/A    Number of Children: N/A  . Years of Education: N/A   Social History Main Topics  . Smoking status: Current Everyday Smoker -- 1.0 packs/day   Types: Cigarettes  . Smokeless tobacco: Former Neurosurgeon  . Alcohol Use: Yes  . Drug Use: None  . Sexually Active: None   Other Topics Concern  . None   Social History Narrative  . None   Past Surgical History  Procedure Date  . Sphincterotomy    Past Medical History  Diagnosis Date  . Diabetes mellitus   . DDD (degenerative disc disease)   . Allergy    BP 116/59  Pulse 76  Resp 16  Ht 5\' 9"  (1.753 m)  Wt 234 lb (106.142 kg)  BMI 34.56 kg/m2  SpO2 97%      Review of Systems  Constitutional: Positive for diaphoresis and unexpected weight change.  HENT: Negative.   Eyes: Negative.   Respiratory: Positive for cough and wheezing.   Cardiovascular: Negative.   Gastrointestinal: Positive for abdominal pain.  Genitourinary: Positive for difficulty urinating.  Musculoskeletal: Positive for back pain.  Skin: Negative.   Neurological: Positive for weakness and numbness.  Hematological: Negative.   Psychiatric/Behavioral: Negative.        Objective:   Physical Exam  Constitutional: He is oriented to person, place, and time. He appears well-developed and well-nourished.       obese  HENT:  Head: Normocephalic.  Neck: Normal range of motion.  Musculoskeletal: Normal range of motion. He exhibits tenderness.  Neurological: He is alert and oriented to person, place, and  time.  Skin: Skin is warm and dry.  Psychiatric: He has a normal mood and affect.     Symmetric normal motor tone is noted throughout. Normal muscle bulk. Muscle testing reveals 5/5 muscle strength of the upper extremity, and 5/5 of the lower extremity. Full range of motion in upper and lower extremities. ROM of spine is restricted into flexion and extension. Fine motor movements are normal in both hands. Sensory is intact and symmetric to light touch, pinprick and proprioception. DTR in the upper and lower extremity are present and symmetric 2+. No clonus is noted.  Patient arises from chair without  difficulty. Narrow based gait with normal arm swing bilateral , able to walk on heels and toes .        Assessment & Plan:  This is a 41 year old  male with 1.LBP, radiating into lateral LE bilateral ( L5 distribution) 2. 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 Ordered a repeat ESI at L4-5. Follow up in 2 weeks, consider a third repeat injection, or other treatment option, depending on results.

## 2012-04-19 NOTE — Patient Instructions (Signed)
Continue with your medication, advised patient to start a walking program.

## 2012-04-22 ENCOUNTER — Telehealth: Payer: Self-pay | Admitting: *Deleted

## 2012-04-22 NOTE — Telephone Encounter (Signed)
Wants a call back.

## 2012-04-22 NOTE — Telephone Encounter (Signed)
We can do an EMG to see if another nerve level may be involved

## 2012-04-22 NOTE — Telephone Encounter (Signed)
Pt wanted to let us know that the epidural injection has not helped him at all. He is still having tingling in his feet and a burning sensation. Any suggestions? Thanks.

## 2012-04-23 NOTE — Telephone Encounter (Signed)
Pt aware of Dr. [name] suggestion.  Please get it approved through work comp. Thanks!

## 2012-04-24 ENCOUNTER — Telehealth: Payer: Self-pay | Admitting: Physical Medicine & Rehabilitation

## 2012-04-24 ENCOUNTER — Telehealth: Payer: Self-pay | Admitting: *Deleted

## 2012-04-24 NOTE — Telephone Encounter (Signed)
Steriod injection hasn't helped, having increased pain in back and leg. Has not been able to work because of this, wants this resolved. Please call.

## 2012-04-24 NOTE — Telephone Encounter (Signed)
Pt is aware that Dr. [name] wants to try an EMG. This has to be approved through work comp.

## 2012-04-24 NOTE — Telephone Encounter (Signed)
Steroid injection did not work.  Has increase in symptoms all the way down right leg, top of foot.

## 2012-04-25 NOTE — Telephone Encounter (Signed)
Pt has an EMG scheduled, is there anything we can do for this patient before his appt?  Please advise.

## 2012-04-25 NOTE — Telephone Encounter (Signed)
Pt informed of medication increase

## 2012-04-25 NOTE — Telephone Encounter (Signed)
The patient has not been working to my knowledge so he does not need a work excuse This is not a major disc.does not require hospitalization We can adjust his current medications Since this is I nerve irritation will increase his Topamax to 50 mg twice a day

## 2012-04-30 ENCOUNTER — Other Ambulatory Visit: Payer: Self-pay | Admitting: *Deleted

## 2012-04-30 ENCOUNTER — Telehealth: Payer: Self-pay | Admitting: *Deleted

## 2012-04-30 MED ORDER — TOPIRAMATE 50 MG PO TABS: 50.0000 mg | ORAL_TABLET | Freq: Two times a day (BID) | ORAL | Status: DC

## 2012-04-30 NOTE — Telephone Encounter (Signed)
Refill Topamax, was increased the last time he called and is now running out.  Rx has been sent in, pt aware.

## 2012-05-06 ENCOUNTER — Telehealth: Payer: Self-pay | Admitting: Physical Medicine & Rehabilitation

## 2012-05-06 NOTE — Telephone Encounter (Signed)
I do not advise due to recent steroid injection

## 2012-05-06 NOTE — Telephone Encounter (Signed)
Dr suggested 10 mg steroids.  Patient would like to try this.

## 2012-05-06 NOTE — Telephone Encounter (Signed)
Please advsie

## 2012-05-07 ENCOUNTER — Ambulatory Visit: Payer: Self-pay | Admitting: Physical Medicine & Rehabilitation

## 2012-05-07 ENCOUNTER — Encounter: Payer: Self-pay | Admitting: Physical Medicine & Rehabilitation

## 2012-05-07 ENCOUNTER — Ambulatory Visit (HOSPITAL_BASED_OUTPATIENT_CLINIC_OR_DEPARTMENT_OTHER): Payer: Worker's Compensation | Admitting: Physical Medicine & Rehabilitation

## 2012-05-07 ENCOUNTER — Encounter: Payer: Worker's Compensation | Attending: Physical Medicine & Rehabilitation

## 2012-05-07 VITALS — BP 132/80 | HR 91 | Resp 16 | Ht 69.0 in | Wt 234.0 lb

## 2012-05-07 DIAGNOSIS — M545 Low back pain, unspecified: Secondary | ICD-10-CM | POA: Insufficient documentation

## 2012-05-07 DIAGNOSIS — E119 Type 2 diabetes mellitus without complications: Secondary | ICD-10-CM | POA: Insufficient documentation

## 2012-05-07 DIAGNOSIS — M79609 Pain in unspecified limb: Secondary | ICD-10-CM | POA: Insufficient documentation

## 2012-05-07 DIAGNOSIS — M5126 Other intervertebral disc displacement, lumbar region: Secondary | ICD-10-CM | POA: Insufficient documentation

## 2012-05-07 DIAGNOSIS — M5137 Other intervertebral disc degeneration, lumbosacral region: Secondary | ICD-10-CM | POA: Insufficient documentation

## 2012-05-07 DIAGNOSIS — M79604 Pain in right leg: Secondary | ICD-10-CM

## 2012-05-07 DIAGNOSIS — M51379 Other intervertebral disc degeneration, lumbosacral region without mention of lumbar back pain or lower extremity pain: Secondary | ICD-10-CM | POA: Insufficient documentation

## 2012-05-07 MED ORDER — METHYLPREDNISOLONE 4 MG PO KIT
PACK | ORAL | Status: AC
Start: 2012-05-07 — End: 2012-05-14

## 2012-05-07 NOTE — Patient Instructions (Signed)
Electromyography (EMG) Test This is a test in which very small electrodes are placed into your muscle tissue. It looks at the electrical impulses of your muscle tissue. This test is used to determine whether or not there are involuntary or spontaneous muscle movements. Involuntary or spontaneous means muscle movements that happen by themselves. This may indicate injury or disease of the nerves which supply that muscle. PREPARATION FOR TEST No preparation or fasting is necessary. Some stimulants such as caffeine and tobacco may need to be avoided for 2-3 hours before test or as instructed by your caregiver. NORMAL FINDINGS No evidence of neuromuscular abnormalities. Ranges for normal findings may vary among different laboratories and hospitals. You should always check with your doctor after having lab work or other tests done to discuss the meaning of your test results and whether your values are considered within normal limits. MEANING OF TEST  Your caregiver will go over the test results with you and discuss the importance and meaning of your results, as well as treatment options and the need for additional tests if necessary. OBTAINING THE TEST RESULTS It is your responsibility to obtain your test results. Ask the lab or department performing the test when and how you will get your results. Document Released: 03/09/2005 Document Revised: 10/26/2011 Document Reviewed: 10/16/2008 [employer] Patient Information 2012 [employer]. 

## 2012-05-07 NOTE — Telephone Encounter (Signed)
Lm informing pt of advice.  Pt has appt today as well.

## 2012-05-10 ENCOUNTER — Encounter: Payer: Self-pay | Admitting: Physical Medicine & Rehabilitation

## 2012-05-13 ENCOUNTER — Telehealth: Payer: Self-pay | Admitting: *Deleted

## 2012-05-13 NOTE — Telephone Encounter (Signed)
Needs a note for employer stating how long he will be out of work (needs a date). Will he be having surgery?

## 2012-05-14 ENCOUNTER — Encounter: Payer: Self-pay | Admitting: Physical Medicine & Rehabilitation

## 2012-05-14 NOTE — Telephone Encounter (Signed)
Please advise and write letter.

## 2012-05-14 NOTE — Telephone Encounter (Signed)
done

## 2012-05-15 ENCOUNTER — Telehealth: Payer: Self-pay | Admitting: Physical Medicine & Rehabilitation

## 2012-05-15 NOTE — Telephone Encounter (Signed)
Needs work status.  Surgery?

## 2012-05-15 NOTE — Telephone Encounter (Signed)
This has already been done and pt was made aware yesterday by Carollee Herter.

## 2012-05-17 ENCOUNTER — Ambulatory Visit: Payer: Self-pay | Admitting: Physical Medicine & Rehabilitation

## 2012-05-20 ENCOUNTER — Ambulatory Visit (HOSPITAL_BASED_OUTPATIENT_CLINIC_OR_DEPARTMENT_OTHER): Payer: Worker's Compensation | Admitting: Physical Medicine & Rehabilitation

## 2012-05-20 ENCOUNTER — Encounter: Payer: Self-pay | Admitting: Physical Medicine & Rehabilitation

## 2012-05-20 ENCOUNTER — Encounter: Payer: Worker's Compensation | Attending: Physical Medicine & Rehabilitation

## 2012-05-20 VITALS — HR 58 | Resp 16 | Ht 69.0 in | Wt 233.4 lb

## 2012-05-20 DIAGNOSIS — M5430 Sciatica, unspecified side: Secondary | ICD-10-CM

## 2012-05-20 DIAGNOSIS — M545 Low back pain, unspecified: Secondary | ICD-10-CM | POA: Insufficient documentation

## 2012-05-20 DIAGNOSIS — M5137 Other intervertebral disc degeneration, lumbosacral region: Secondary | ICD-10-CM

## 2012-05-20 DIAGNOSIS — M79609 Pain in unspecified limb: Secondary | ICD-10-CM | POA: Insufficient documentation

## 2012-05-20 DIAGNOSIS — M51379 Other intervertebral disc degeneration, lumbosacral region without mention of lumbar back pain or lower extremity pain: Secondary | ICD-10-CM | POA: Insufficient documentation

## 2012-05-20 DIAGNOSIS — M5126 Other intervertebral disc displacement, lumbar region: Secondary | ICD-10-CM | POA: Insufficient documentation

## 2012-05-20 DIAGNOSIS — E119 Type 2 diabetes mellitus without complications: Secondary | ICD-10-CM | POA: Insufficient documentation

## 2012-05-20 DIAGNOSIS — M5136 Other intervertebral disc degeneration, lumbar region: Secondary | ICD-10-CM

## 2012-05-20 MED ORDER — DIAZEPAM 10 MG PO TABS: 10.0000 mg | ORAL_TABLET | Freq: Once | ORAL | Status: DC

## 2012-05-20 MED ORDER — DIAZEPAM 10 MG PO TABS: 10.0000 mg | ORAL_TABLET | Freq: Once | ORAL | Status: AC

## 2012-05-20 NOTE — Progress Notes (Signed)
Subjective:    Patient ID: Joel Page, male    DOB: 18-Jun-1971, 41 y.o.   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 He initially was treated in the [facility], but then followed up at the [facility]. At [facility], he saw Dr. Ruthann Cancer. He was treated  with physical therapy as well as medications. He had an MRI at [facility],  Mar 21, 2011, degenerative disk disease L4-5 and L5-S1, mild neural  foraminal narrowing at L5-S1. This was referred to Dr. Golda Acre note  from Apr 03, 2011. He received tramadol as well as Flexeril for pain.  His relief from Flexeril was not particularly good, tramadol was  helpful, although, when he kept on it for a long period of time, it  became less effective. He was also seen by Dr. Merri Ray from  [facility] Medicine [facility]. He was recommended to go back to  light duty work on March 30. He had an MRI at [facility], October 19, 2010 showing small central L4-5 disk protrusion, mild central canal  narrowing, shallow rightward eccentric L5-S1 disk bulge without canal or  foraminal stenosis.  The patient reportedly had an FCE done some time after he saw Dr.  Yves Dill. The report is not available to me at this time. He did have  an epidural injection performed, November 30, 2010 at [facility],  L4-5 translaminar approach, right paramedian. He states that he  developed numbness several hours later which made it difficult for him  to walk in the grocery store with his wife. The patient's complaints  with Dr. Yves Dill were mainly axial back pain as well as radiculitis with  Dr. Gerrit Heck, mainly axial back pain. Other treatments included  diclofenac which was not helpful, Celebrex mild relief, Skelaxin no  relief, Flexeril per Dr. Yves Dill is good relief but then he states  that  today did not have good relief, tramadol some relief. He had  constipation with Percocet. I have also reviewed notes from [facility] in 2011. He also had a neurosurgical evaluation with  Dr. Dorian Heckle on July 10, 2011. Surgery was not recommended.  MRI was described as mild lumbar degenerative disk changes without overt  compressive structural abnormality.   Pain Inventory Average Pain 9 Pain Right Now 9 My pain is constant, sharp, burning, stabbing, tingling and aching  In the last 24 hours, has pain interfered with the following? General activity 9 Relation with others 9 Enjoyment of life 9 What TIME of day is your pain at its worst? constant Sleep (in general) Poor  Pain is worse with: walking, bending, sitting and standing Pain improves with: rest, heat/ice, pacing activities and medication Relief from Meds: 7  Mobility walk without assistance how many minutes can you walk? 10 ability to climb steps?  yes do you drive?  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  no  Physicians involved in your care Any changes since last visit?  no   Family History  Problem Relation Age of Onset  . Cancer Mother   . Dementia Father   . COPD  Father    History   Social History  . Marital Status: Legally Separated    Spouse Name: N/A    Number of Children: N/A  . Years of Education: N/A   Social History Main Topics  . Smoking status: Current Everyday Smoker -- 1.0 packs/day    Types: Cigarettes  . Smokeless tobacco: Former Neurosurgeon  . Alcohol Use: Yes  . Drug Use: None  . Sexually Active: None   Other Topics Concern  . None   Social History Narrative  . None   Past Surgical History  Procedure Date  . Sphincterotomy      Past Medical History  Diagnosis Date  . Diabetes mellitus   . DDD (degenerative disc disease)   . Allergy    Pulse 58  Resp 16  Ht 5\' 9"  (1.753 m)  Wt 233 lb 6.4 oz (105.87 kg)  BMI 34.47 kg/m2  SpO2 96%   Review of Systems  Constitutional: Positive for diaphoresis and unexpected weight change.  Respiratory: Positive for cough and wheezing.   Gastrointestinal: Positive for diarrhea.  Genitourinary: Positive for difficulty urinating.  Musculoskeletal: Positive for back pain and gait problem.       Spasms  Neurological: Positive for dizziness, weakness and numbness.       Tingling  Psychiatric/Behavioral: The patient is nervous/anxious.   All other systems reviewed and are negative.       Objective:   Physical Exam  Nursing note and vitals reviewed. Constitutional: He is oriented to person, place, and time. He appears well-developed.       overweight  HENT:  Head: Normocephalic and atraumatic.  Musculoskeletal:       Lumbar back: He exhibits decreased range of motion, tenderness and pain. He exhibits no edema, no deformity and no spasm.       Negative straight leg raising test  Neurological: He is alert and oriented to person, place, and time. He has normal strength. He displays no atrophy. A sensory deficit is present. He exhibits normal muscle tone. Gait normal.       Reports decreased sensation to pinprick in the L4, L5, and S1 dermatomes. Right side greater than left side Mild tenderness over the right PSIS  Psychiatric: His affect is blunt.          Assessment & Plan:  1. Lumbar degenerative disc with moderate stenosis right greater than left. His EMG is negative for nerve damage. As I discussed with the patient the EMG does not measure pain. Out like Dr. Venetia Maxon from neurosurgery to reevaluate the patient and compare his most recent MRI in May to the previous examination. I am aware that in August of 2012 Dr. Venetia Maxon did not recommend surgery.  2. 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 Chronic pain management no change in medications. As I discussed with the patient I think he can tolerate a sedentary type work activities with alternating sitting and standing every one half hour I did not recommend that he change tires or do repairs in his crawl space as she has tried in his own home. This would be more than the sedentary-type restriction.  He also mentioned that he would like to take a narcotic analgesic or something stronger for pain when he flares perhaps once or twice a month. I would like him to wait and assess the effect of the sacroiliac injection. I also cautioned him not to exceed his restrictions. His flareups seem to be activity related and involved  more strenuous activity or prolonged bending

## 2012-05-20 NOTE — Addendum Note (Signed)
Addended by: Doreene Eland on: 05/20/2012 12:24 PM   Modules accepted: Orders

## 2012-05-24 ENCOUNTER — Ambulatory Visit (HOSPITAL_BASED_OUTPATIENT_CLINIC_OR_DEPARTMENT_OTHER): Payer: Worker's Compensation | Admitting: Physical Medicine & Rehabilitation

## 2012-05-24 ENCOUNTER — Encounter: Payer: Self-pay | Admitting: Physical Medicine & Rehabilitation

## 2012-05-24 VITALS — BP 133/68 | HR 95 | Resp 16 | Ht 69.0 in | Wt 234.6 lb

## 2012-05-24 DIAGNOSIS — M533 Sacrococcygeal disorders, not elsewhere classified: Secondary | ICD-10-CM

## 2012-05-24 NOTE — Patient Instructions (Signed)
Return in one month Sedentary restrictions for work Continue current medications Keep track of your pain over the next 24-48 hours.

## 2012-05-24 NOTE — Progress Notes (Signed)
  PROCEDURE RECORD [facility]   Name: Joel Page DOB:01/22/1971 MRN: [id number]  Date:05/24/2012  Physician: Claudette Laws, MD    Nurse/CMA: Shumaker RN  Allergies:  Allergies  Allergen Reactions  . Neurontin (Gabapentin) Other (See Comments)    CHEST PAINS  . Diclofenac Sodium Other (See Comments)    Raised blood sugar     Consent Signed: yes  Is patient diabetic? yes  CBG today? Has not checked it today  Pregnant: no LMP: No LMP for male patient. (age 67-55)  Anticoagulants: no Anti-inflammatory: no Antibiotics: no  Procedure:Bilateral Sacroiliac Steroid Injection Position: Prone Start Time: 11:21 End Time: 11:29 Fluoro Time: 35 sec  RN/CMA Designer, multimedia    Time 10:54 11:336    BP 133/68 132/60    Pulse 95 77    Respirations 16 16    O2 Sat 96 97    S/S 6 6    Pain Level 8 7     D/C home with Lawson Fiscal , patient A & O X 3, D/C instructions reviewed, and sits independently.

## 2012-05-24 NOTE — Progress Notes (Signed)
Bilateral sacroiliac injections under fluoroscopic guidance  Indication: Low back and buttocks pain not relieved by medication management and other conservative care.  Informed consent was obtained after describing risks and benefits of the procedure with the patient, this includes bleeding, bruising, infection, paralysis and medication side effects. The patient wishes to proceed and has given written consent. The patient was placed in a prone position. The lumbar and sacral area was marked and prepped with Betadine. A 25-gauge 1-1/2 inch needle was inserted into the skin and subcutaneous tissue and 1 mL of 1% lidocaine was injected into each side. Then a 25-gauge 3 inch spinal needle was inserted under fluoroscopic guidance into the left sacroiliac joint. AP and lateral images were utilized. Omnipaque 180x0.5 mL under live fluoroscopy demonstrated no intravascular uptake. Then a solution containing one ML of 40 mg per mL Depakote met drawl in 2 ML of 2% lidocaine MPF was injected x1.5 mL. This same procedure was repeated on the right side using the same needle, injectate, and technique. Patient tolerated the procedure well. Post procedure instructions were given. Please see post procedure form. 

## 2012-05-29 ENCOUNTER — Telehealth: Payer: Self-pay | Admitting: Physical Medicine & Rehabilitation

## 2012-05-29 NOTE — Telephone Encounter (Signed)
Joel Page is experiencing increased back pain and it runs down his leg, and has been felt on the front of his leg as well, since he had the ESI injection with Dr [name].  The tramadol and flexeril are not "getting it".  He says he needs something else. See Dr [name] note from 05/20/12 visit.

## 2012-05-29 NOTE — Telephone Encounter (Signed)
Lm for pt to call office back regarding his pain.

## 2012-05-29 NOTE — Telephone Encounter (Signed)
Having increased pain on L side since injection.  Regular meds not cutting it.

## 2012-05-30 NOTE — Telephone Encounter (Signed)
He could take mobic 15mg  , 1 tablet /day, if he can take anti inflammatories, then see Dr. Doroteo Bradford to follow up

## 2012-05-31 MED ORDER — MELOXICAM 15 MG PO TABS: 15.0000 mg | ORAL_TABLET | Freq: Every day | ORAL | Status: DC

## 2012-05-31 NOTE — Telephone Encounter (Signed)
Notified Mr Wight of medication and to follow up with Dr [name].

## 2012-05-31 NOTE — Telephone Encounter (Signed)
thanks

## 2012-06-12 ENCOUNTER — Telehealth: Payer: Self-pay

## 2012-06-12 NOTE — Telephone Encounter (Signed)
Pt called needing a letter stating he was out of work from 05-20-12 to 05-31-12.  He is currently not being paid for this time.  Please advise.

## 2012-06-12 NOTE — Telephone Encounter (Signed)
Lm advising pt letter is ready to pick up.

## 2012-06-24 DIAGNOSIS — E13319 Other specified diabetes mellitus with unspecified diabetic retinopathy without macular edema: Secondary | ICD-10-CM

## 2012-06-24 HISTORY — DX: Other specified diabetes mellitus with unspecified diabetic retinopathy without macular edema: E13.319

## 2012-06-28 ENCOUNTER — Emergency Department: Payer: Self-pay | Admitting: *Deleted

## 2012-07-01 ENCOUNTER — Encounter: Payer: Self-pay | Admitting: Physical Medicine & Rehabilitation

## 2012-07-01 ENCOUNTER — Ambulatory Visit (HOSPITAL_BASED_OUTPATIENT_CLINIC_OR_DEPARTMENT_OTHER): Payer: Worker's Compensation | Admitting: Physical Medicine & Rehabilitation

## 2012-07-01 ENCOUNTER — Encounter: Payer: Worker's Compensation | Attending: Physical Medicine & Rehabilitation

## 2012-07-01 VITALS — BP 119/52 | HR 87 | Resp 14 | Ht 69.0 in | Wt 236.2 lb

## 2012-07-01 DIAGNOSIS — M545 Low back pain, unspecified: Secondary | ICD-10-CM | POA: Insufficient documentation

## 2012-07-01 DIAGNOSIS — F1910 Other psychoactive substance abuse, uncomplicated: Secondary | ICD-10-CM

## 2012-07-01 DIAGNOSIS — M51379 Other intervertebral disc degeneration, lumbosacral region without mention of lumbar back pain or lower extremity pain: Secondary | ICD-10-CM | POA: Insufficient documentation

## 2012-07-01 DIAGNOSIS — M5137 Other intervertebral disc degeneration, lumbosacral region: Secondary | ICD-10-CM | POA: Insufficient documentation

## 2012-07-01 DIAGNOSIS — M79609 Pain in unspecified limb: Secondary | ICD-10-CM | POA: Insufficient documentation

## 2012-07-01 DIAGNOSIS — M5126 Other intervertebral disc displacement, lumbar region: Secondary | ICD-10-CM | POA: Insufficient documentation

## 2012-07-01 DIAGNOSIS — E119 Type 2 diabetes mellitus without complications: Secondary | ICD-10-CM | POA: Insufficient documentation

## 2012-07-01 DIAGNOSIS — M5136 Other intervertebral disc degeneration, lumbar region: Secondary | ICD-10-CM

## 2012-07-01 DIAGNOSIS — M5430 Sciatica, unspecified side: Secondary | ICD-10-CM

## 2012-07-01 MED ORDER — TRAMADOL HCL 50 MG PO TABS: 100.0000 mg | ORAL_TABLET | Freq: Three times a day (TID) | ORAL | Status: DC | PRN

## 2012-07-01 MED ORDER — HYDROCODONE-ACETAMINOPHEN 5-325 MG PO TABS: 1.0000 | ORAL_TABLET | Freq: Four times a day (QID) | ORAL | Status: AC | PRN

## 2012-07-01 MED ORDER — CYCLOBENZAPRINE HCL 10 MG PO TABS: 10.0000 mg | ORAL_TABLET | Freq: Three times a day (TID) | ORAL | Status: DC | PRN

## 2012-07-01 MED ORDER — TRAZODONE HCL 50 MG PO TABS: 50.0000 mg | ORAL_TABLET | Freq: Every day | ORAL | Status: DC

## 2012-07-01 NOTE — Progress Notes (Signed)
Subjective:    Patient ID: Joel Page, male    DOB: Dec 19, 1970, 41 y.o.   MRN: [id number]  HPI The patient reports no appreciable improvement at the sacroiliac injections. Patient states he's been working on his car as well as doing some yard work. He also mentioned an episode where he was bending over at home and started having back spasms after this. He has numbness in his right leg which has not changed. We reviewed EMG study from earlier this year which was normal. I reviewed MRI of the lumbar spine which was performed in May of this year The patient has resumed his work activities with sedentary restrictions Pain Inventory Average Pain 9 Pain Right Now 9 My pain is constant, sharp and tingling  In the last 24 hours, has pain interfered with the following? General activity 10 Relation with others 10 Enjoyment of life 10 What TIME of day is your pain at its worst? all of the time Sleep (in general) Poor  Pain is worse with: walking, bending, sitting, inactivity, standing and some activites Pain improves with: rest, heat/ice and medication Relief from Meds: 2  Mobility walk with assistance how many minutes can you walk? 5 ability to climb steps?  no do you drive?  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  yes x-rays CT/MRI  Physicians involved in your care Any changes since last visit?  no   Family History  Problem Relation Age of Onset  . Cancer Mother   . Dementia Father   . COPD Father    History   Social History  . Marital Status: Legally Separated    Spouse Name: N/A    Number of Children: N/A  . Years of Education: N/A   Social History Main Topics  . Smoking status: Current Everyday Smoker -- 1.0 packs/day    Types:  Cigarettes  . Smokeless tobacco: Former Neurosurgeon  . Alcohol Use: Yes  . Drug Use: None  . Sexually Active: None   Other Topics Concern  . None   Social History Narrative  . None   Past Surgical History  Procedure Date  . Sphincterotomy    Past Medical History  Diagnosis Date  . Diabetes mellitus   . DDD (degenerative disc disease)   . Allergy   . Retinopathy due to secondary diabetes 06/24/12    both eyes   BP 119/52  Pulse 87  Resp 14  Ht 5\' 9"  (1.753 m)  Wt 236 lb 3.2 oz (107.14 kg)  BMI 34.88 kg/m2  SpO2 97%    Review of Systems  Constitutional: Positive for diaphoresis and unexpected weight change.       High blood sugars  HENT: Positive for neck pain.   Gastrointestinal: Positive for abdominal pain and diarrhea.  Genitourinary: Positive for difficulty urinating.  Musculoskeletal: Positive for back pain.       Spasms  Neurological: Positive for weakness and numbness.       Tingling  Psychiatric/Behavioral: The patient is nervous/anxious.   All other systems reviewed and are negative.       Objective:   Physical Exam  Constitutional: He is oriented to person, place, and time. He appears well-developed and well-nourished.  Neurological: He is alert and oriented to person, place, and time. Gait normal.  Reflex Scores:      Patellar reflexes are 0 on the right side and 0 on the left side.      Achilles reflexes are 0 on the right side and 0 on the left side.      Poor relaxation with the lower remedies Sensation reduced in the right lower extremity nondermatomal below the knee  Has give way weakness right quad right ankle unchanged from previous  Psychiatric: He has a normal mood and affect.   Tenderness over the left PSIS Lumbar range of motion is reduced in all directions Negative straight leg raising test does have back pain when raising up the right leg       Assessment & Plan:  1. Lumbar degenerative disc with  Sciatica but no evidence of nerve  damage on electromyography testing.as I discussed with the patient, I feel that he is at maximal medical improvement. We had a long discussion about what has been tried and the fact that he's had no appreciable change over the last several months. He does reasonably well on the current pain medication regimen which consists of tramadol 100 mg 3 times per day as well as Topamax 50 mg twice a day. Because of GI problems I want him to use Mobic only on flareups. If his flareups do not respond to Mobic he needs to be reevaluated. In addition he can continue his cyclobenzaprine 10 mg at night.he states that the current flareup is persisting despite Mobic. I will give him a prescription for 30 hydrocodone tablets to be used up to 4 times per day I think he can work at a sedentary work level. I do not think any further injections are needed I emphasized that the restrictions apply to his household activities as well and that his current flareup is really related to what he did at home.  2. Right upper extremity as well as neck pain.this is a new injury which was not sustained at work. I asked him to followup with his other physicians for this. Marland Kitchen

## 2012-07-01 NOTE — Patient Instructions (Addendum)
You will need a urine drug screen today since I am prescribing opiate medicine for your current flareup If you have another flareup of your pain in the future you'll need to be seen by my assistant Clydie Braun before we can prescribe another short-term dose of hydrocodone. Since your right arm pain was not part of your original injury I will ask you to see your other doctors for treatment. In terms of your low back and leg pain, I feel you are at maximum medical improvement. He will have good days and bad days however I do think it is safe for you to work at a sedentary restriction level. As I discussed with you I do not think doing any heavy manual work at home is advisable in May result in a flareup of your pain.

## 2012-07-05 ENCOUNTER — Telehealth: Payer: Self-pay

## 2012-07-05 NOTE — Telephone Encounter (Signed)
Please get dates put on letter head and I will sign

## 2012-07-05 NOTE — Telephone Encounter (Signed)
Needs note putting out form last Monday thru Friday and note for being here Monday.

## 2012-07-08 NOTE — Telephone Encounter (Signed)
Lm for pt to give [place] more specific dates and details for letter.

## 2012-07-12 ENCOUNTER — Telehealth: Payer: Self-pay | Admitting: *Deleted

## 2012-07-12 NOTE — Telephone Encounter (Signed)
Needs a completed work note for the date of 07/01/12. He would also like the last 5 OV notes mailed to him.  I will get his work noted completed. Sherron Monday will need to deal with the medical records part of this per [place].

## 2012-07-30 ENCOUNTER — Other Ambulatory Visit: Payer: Self-pay | Admitting: Physical Medicine & Rehabilitation

## 2012-08-04 ENCOUNTER — Other Ambulatory Visit: Payer: Self-pay | Admitting: Physical Medicine & Rehabilitation

## 2012-08-30 ENCOUNTER — Telehealth: Payer: Self-pay | Admitting: Physical Medicine & Rehabilitation

## 2012-08-30 NOTE — Telephone Encounter (Signed)
Patient is in increased pain.  Advised him to try OTC pain patches, tylenol or ibuprofen to get him through until his appointment.  He asked if he could go to the ER if needed under workers comp, told him to contact workers comp.

## 2012-08-30 NOTE — Telephone Encounter (Signed)
Has had flare up of pain.  Both sides.  Pain all the way down to L foot and spasms on R side.  Please call.

## 2012-09-03 ENCOUNTER — Ambulatory Visit (HOSPITAL_BASED_OUTPATIENT_CLINIC_OR_DEPARTMENT_OTHER): Payer: Worker's Compensation | Admitting: Physical Medicine & Rehabilitation

## 2012-09-03 ENCOUNTER — Encounter: Payer: Self-pay | Admitting: Physical Medicine & Rehabilitation

## 2012-09-03 ENCOUNTER — Encounter: Payer: Worker's Compensation | Attending: Physical Medicine & Rehabilitation

## 2012-09-03 VITALS — BP 126/71 | HR 97 | Resp 14 | Ht 69.0 in | Wt 237.0 lb

## 2012-09-03 DIAGNOSIS — E119 Type 2 diabetes mellitus without complications: Secondary | ICD-10-CM | POA: Insufficient documentation

## 2012-09-03 DIAGNOSIS — M5430 Sciatica, unspecified side: Secondary | ICD-10-CM

## 2012-09-03 DIAGNOSIS — M5137 Other intervertebral disc degeneration, lumbosacral region: Secondary | ICD-10-CM | POA: Insufficient documentation

## 2012-09-03 DIAGNOSIS — M545 Low back pain, unspecified: Secondary | ICD-10-CM | POA: Insufficient documentation

## 2012-09-03 DIAGNOSIS — M5126 Other intervertebral disc displacement, lumbar region: Secondary | ICD-10-CM | POA: Insufficient documentation

## 2012-09-03 DIAGNOSIS — M5136 Other intervertebral disc degeneration, lumbar region: Secondary | ICD-10-CM

## 2012-09-03 DIAGNOSIS — M79609 Pain in unspecified limb: Secondary | ICD-10-CM | POA: Insufficient documentation

## 2012-09-03 DIAGNOSIS — M51379 Other intervertebral disc degeneration, lumbosacral region without mention of lumbar back pain or lower extremity pain: Secondary | ICD-10-CM | POA: Insufficient documentation

## 2012-09-03 MED ORDER — BACLOFEN 10 MG PO TABS: 10.0000 mg | ORAL_TABLET | Freq: Two times a day (BID) | ORAL | Status: DC

## 2012-09-03 NOTE — Progress Notes (Signed)
Subjective:    Patient ID: Joel Page, male    DOB: July 24, 1971, 41 y.o.   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 He initially was treated in the [facility], but then followed up at the [facility]. At [facility], he saw Dr. Ruthann Cancer. He was treated  with physical therapy as well as medications. He had an MRI at [facility],  Mar 21, 2011, degenerative disk disease L4-5 and L5-S1, mild neural  foraminal narrowing at L5-S1. This was referred to Dr. Golda Acre note  from Apr 03, 2011. He received tramadol as well as Flexeril for pain.  His relief from Flexeril was not particularly good, tramadol was  helpful, although, when he kept on it for a long period of time, it  became less effective. He was also seen by Dr. Merri Ray from  [facility]. He was recommended to go back to  light duty work on March 30. He had an MRI at [facility], October 19, 2010 showing small central L4-5 disk protrusion, mild central canal  narrowing, shallow rightward eccentric L5-S1 disk bulge without canal or  foraminal stenosis.  The patient reportedly had an FCE done some time after he saw Dr.  Yves Dill. The report is not available to me at this time. He did have  an epidural injection performed, November 30, 2010 at [facility],  L4-5 translaminar approach, right paramedian. He states that he  developed numbness several hours later which made it difficult for him  to walk in the grocery store with his wife. The patient's complaints  with Dr. Yves Dill were mainly axial back pain as well as radiculitis with  Dr. Gerrit Heck, mainly axial back pain. Other treatments included  diclofenac which was not helpful, Celebrex mild relief, Skelaxin no  relief, Flexeril per Dr. Yves Dill is good relief but then he states  that  today did not have good relief, tramadol some relief. He had  constipation with Percocet. I have also reviewed notes from [facility] in 2011. He also had a neurosurgical evaluation with  Dr. Dorian Heckle on July 10, 2011. Surgery was not recommended.  MRI was described as mild lumbar degenerative disk changes without overt  compressive structural abnormality.  Thigh pain and left little toe pain increased for the last 5 days.Increased stress at home. No trauma or falls. Pain Inventory Average Pain 9 Pain Right Now 10 My pain is constant  In the last 24 hours, has pain interfered with the following? General activity 10 Relation with others 10 Enjoyment of life 10 What TIME of day is your pain at its worst? day and night Sleep (in general) Fair  Pain is worse with: walking, bending, sitting, inactivity, standing and some activites Pain improves with: rest, heat/ice, pacing activities and medication Relief from Meds: 3  Mobility walk without assistance how many minutes can you walk? 10 ability to climb steps?  yes do you drive?  yes Do you have any goals in this area?  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  yes  Neuro/Psych bladder control problems bowel control problems weakness numbness tingling trouble walking spasms anxiety loss  of taste or smell  Prior Studies Any changes since last visit?  no  *RADIOLOGY REPORT*  Clinical Data: Low back pain with bilateral leg pain and weakness,  right greater than left.  MRI LUMBAR SPINE WITHOUT CONTRAST  Technique: Multiplanar and multiecho pulse sequences of the lumbar  spine were obtained without intravenous contrast.  Comparison: None.  Findings: The lowest full intervertebral disk space is labeled L5-  S1. If procedural intervention is to be performed,  careful  correlation with this numbering strategy is recommended.  The conus medullaris appears unremarkable. Conus level: L1.  Intervertebral disc desiccation is noted at L4-5. Mild type 1  degenerative endplate findings present posteriorly and L4-5.  No significant vertebral subluxation.  Nonspecific low-level increased inversion recovery weighted signal  is present in the right sacrum on image 1 of series 4.  Additional findings at individual levels are as follows:  L1-2: Unremarkable.  L2-3: Unremarkable.  L3-4: No impingement. Equivocal disc bulge.  L4-5: Moderate right eccentric central stenosis due to disc bulge  and right paracentral disc protrusion.  L5-S1: Mild bilateral foraminal stenosis and mild left  subarticular lateral recess stenosis due to disc bulge, left  greater than right facet arthropathy, and mild inferior endplate  spurring from L5 and the right neural foramen.  IMPRESSION:  1. Moderate right eccentric central stenosis at L4-5 related to  disc protrusion.  2. Mild bilateral foraminal stenosis and mild left subarticular  lateral recess stenosis at L5-S1 due to spondylosis and  degenerative disc disease.  3. Nonspecific low-level edema in the right side of the sacrum is  partially included on today's exam and could relate to  sacroiliitis, insufficiency injury, or trauma. This could be  further investigated with dedicated bony pelvic MRI, if clinically  warranted.    Physicians involved in your care Any changes since last visit?  no   Family History  Problem Relation Age of Onset  . Cancer Mother   . Dementia Father   . COPD Father    History   Social History  . Marital Status: Legally Separated    Spouse Name: N/A    Number of Children: N/A  . Years of Education: N/A   Social History Main Topics  . Smoking status: Current Every Day Smoker -- 1.0 packs/day    Types: Cigarettes  . Smokeless tobacco: Former Neurosurgeon  . Alcohol Use: Yes  . Drug  Use: None  . Sexually Active: None   Other Topics Concern  . None   Social History Narrative  . None   Past Surgical History  Procedure Date  . Sphincterotomy    Past Medical History  Diagnosis Date  . Diabetes mellitus   . DDD (degenerative disc disease)   . Allergy   . 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  Neurological: Positive for weakness and numbness.  Psychiatric/Behavioral: The patient is nervous/anxious.   All other systems reviewed and are negative.       Objective:   Physical Exam  Constitutional: He is oriented to person, place, and time. He appears well-developed and well-nourished.  Neurological: He is alert and oriented to person, place, and time. Gait normal.  Reflex Scores:  Patellar  reflexes are 0 on the right side and 0 on the left side.  Achilles reflexes are 0 on the right side and 0 on the left side. Poor relaxation with the lower remedies Sensation reduced in the right lower extremity nondermatomal below the knee As well as decreased sensation left lateral foot Has give way weakness right quad right ankle unchanged from previous  Psychiatric: He has a normal mood and affect.   Tenderness over the Left greater than right PSIS  Lumbar range of motion is reduced in all directions  Negative straight leg raising test does have back pain when raising up the right leg    Assessment & Plan:   1. Lumbar degenerative disc with Sciatica but no evidence of nerve damage on electromyography testing.as I discussed with the patient, I feel that he is at maximal medical improvement. We had a long discussion about what has been tried and the fact that he's had no appreciable change over the last several months. He does reasonably  well on the current pain medication regimen which consists of tramadol 100 mg 3 times per day as well as Topamax 50 mg twice a day. Because of GI problems I want him to use Mobic only on flareups. If his flareups do not respond to Mobic he needs to be reevaluated. In addition he can continue his cyclobenzaprine 10 mg at night.he states that the current flareup is persisting despite Mobic.  I think he can work at a sedentary work level.  I do not think any further injections are needed  He may have paresthesias from the Topamax however at this point the benefits outweigh the uncomfortable side effect The patient fell asleep because he took cyclobenzaprine at work.Reminded him to take it only at night For the current flareup will give him baclofen 10 mg twice a day #30 1 refill I'll see him back in one month .   Family CommentsEdit                                                            Assessment & Plan:

## 2012-09-03 NOTE — Patient Instructions (Addendum)
Take baclofen 10 mg twice a day for 2 weeks and then stop after a flareup subsides May continue cyclobenzaprine at night only Continue your other medications as is

## 2012-09-05 ENCOUNTER — Telehealth: Payer: Self-pay | Admitting: Physical Medicine & Rehabilitation

## 2012-09-05 ENCOUNTER — Encounter: Payer: Self-pay | Admitting: *Deleted

## 2012-09-05 NOTE — Telephone Encounter (Signed)
Pt aware that we will put a work note in the mail to him today. I advised him that he needs to ask for this when he is here for his appointments.

## 2012-09-05 NOTE — Telephone Encounter (Signed)
Patient needing work note 

## 2012-09-09 ENCOUNTER — Telehealth: Payer: Self-pay

## 2012-09-09 ENCOUNTER — Emergency Department: Payer: Self-pay | Admitting: Emergency Medicine

## 2012-09-09 NOTE — Telephone Encounter (Signed)
Patients has been taking medications but he is now experiencing itching and is sleepy.  Please advise.

## 2012-09-09 NOTE — Telephone Encounter (Signed)
Let VM to please call us back to give more information on what medication he is speaking of.

## 2012-09-10 NOTE — Telephone Encounter (Signed)
Joel Page says that he has been itching and increased sleepiness.  And doesn't know if it is the combination of Topamax, Baclofen, and Tramadol because individually he has not had a problem with them. He mentioned "they gave him benadryl and some prednisone".

## 2012-09-11 ENCOUNTER — Telehealth: Payer: Self-pay | Admitting: Physical Medicine & Rehabilitation

## 2012-09-11 NOTE — Telephone Encounter (Signed)
Had allergic reaction to meds.  Stopped taking.  Woke up this morning having difficulty moving leg

## 2012-09-11 NOTE — Telephone Encounter (Signed)
Advised patient to contact PCP due to different medication reactions.  Also advised him he can go to urgent care if needed.  He agreed.

## 2012-09-12 ENCOUNTER — Telehealth: Payer: Self-pay | Admitting: Physical Medicine & Rehabilitation

## 2012-09-12 NOTE — Telephone Encounter (Signed)
Patient has been having trouble ever since last week.  Has not been working.  Someone please call.

## 2012-09-12 NOTE — Telephone Encounter (Signed)
Lm reminding patient that he was advised to make appt or call pcp regarding allergic reactions.  Also advised him since we did not tell him to miss work we could not write a note for him at this time.  If patient calls back he is to make an appt.

## 2012-09-13 ENCOUNTER — Other Ambulatory Visit: Payer: Self-pay | Admitting: Physical Medicine & Rehabilitation

## 2012-09-13 ENCOUNTER — Encounter: Payer: Self-pay | Admitting: Physical Medicine & Rehabilitation

## 2012-09-13 ENCOUNTER — Ambulatory Visit (HOSPITAL_BASED_OUTPATIENT_CLINIC_OR_DEPARTMENT_OTHER): Payer: Worker's Compensation | Admitting: Physical Medicine & Rehabilitation

## 2012-09-13 VITALS — BP 129/72 | HR 102 | Resp 14 | Ht 69.0 in | Wt 236.0 lb

## 2012-09-13 DIAGNOSIS — R52 Pain, unspecified: Secondary | ICD-10-CM

## 2012-09-13 DIAGNOSIS — G8929 Other chronic pain: Secondary | ICD-10-CM

## 2012-09-13 DIAGNOSIS — M545 Low back pain: Secondary | ICD-10-CM

## 2012-09-13 MED ORDER — ACETAMINOPHEN-CODEINE #3 300-30 MG PO TABS: 1.0000 | ORAL_TABLET | Freq: Four times a day (QID) | ORAL | Status: DC | PRN

## 2012-09-13 MED ORDER — TOPIRAMATE 50 MG PO TABS: 50.0000 mg | ORAL_TABLET | Freq: Two times a day (BID) | ORAL | Status: DC

## 2012-09-13 NOTE — Patient Instructions (Signed)
You have a prescription for Tylenol No. 3 #30 no refills This must last for At least one month. You can take this only during flareups We'll see you in 2 months No change to your work restrictions. Your nasal congestion is likely from an allergic sinusitis. If this does not improve you need to see a family doctor

## 2012-09-13 NOTE — Progress Notes (Signed)
Subjective:    Patient ID: Joel Page, male    DOB: 06/18/1971, 41 y.o.   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 He initially was treated in the [facility], but then followed up at the [facility]. At [facility], he saw Dr. Ruthann Cancer. He was treated  with physical therapy as well as medications. He had an MRI at [facility],  Mar 21, 2011, degenerative disk disease L4-5 and L5-S1, mild neural  foraminal narrowing at L5-S1. This was referred to Dr. Golda Acre note  from Apr 03, 2011. He received tramadol as well as Flexeril for pain.  His relief from Flexeril was not particularly good, tramadol was  helpful, although, when he kept on it for a long period of time, it  became less effective. He was also seen by Dr. Merri Ray from  Physical Medicine Rehabilitation. He was recommended to go back to  light duty work on March 30. He had an MRI at [facility], October 19, 2010 showing small central L4-5 disk protrusion, mild central canal  narrowing, shallow rightward eccentric L5-S1 disk bulge without canal or  foraminal stenosis.  The patient reportedly had an FCE done some time after he saw Dr.  Yves Dill. The report is not available to me at this time. He did have  an epidural injection performed, November 30, 2010 at [facility],  L4-5 translaminar approach, right paramedian. He states that he  developed numbness several hours later which made it difficult for him  to walk in the grocery store with his wife. The patient's complaints  with Dr. Yves Dill were mainly axial back pain as well as radiculitis with  Dr. Gerrit Heck, mainly axial back pain. Other treatments included  diclofenac which was not helpful, Celebrex mild relief, Skelaxin no  relief, Flexeril per Dr. Yves Dill is good relief but then he states that    today did not have good relief, tramadol some relief. He had  constipation with Percocet. I have also reviewed notes from [facility] in 2011. He also had a neurosurgical evaluation with  Dr. Dorian Heckle on July 10, 2011. Surgery was not recommended.  MRI was described as mild lumbar degenerative disk changes without overt  compressive structural abnormality.  Itching broke out in hives as well as nasal congestion starting 09/09/2012. He took Benadryl which improved the itching. Still has nasal congestion. No prior history of seasonal allergies. Was using some type of cleaning products at work. HPI  Pain Inventory Average Pain 9 Pain Right Now 9 My pain is sharp, burning, dull, stabbing, tingling and aching  In the last 24 hours, has pain interfered with the following? General activity 10 Relation with others 10 Enjoyment of life 10 What TIME of day is your pain at its worst? morning and night Sleep (in general) Fair  Pain is worse with: walking, bending, sitting, inactivity, standing and some activites Pain improves with: rest, heat/ice, therapy/exercise, pacing activities and medication Relief from Meds: 3  Mobility walk without assistance how many minutes can you walk? 10 ability to climb steps?  yes do you drive?  yes Do you have any goals in this area?  yes  Function employed # of Personnel officer  Neuro/Psych bladder control problems weakness numbness tingling trouble walking spasms anxiety  Prior Studies Any changes since last visit?  no  Physicians involved in your care Any changes since last visit?  no   Family History  Problem Relation Age of Onset  . Cancer Mother   . Dementia Father   . COPD Father    History   Social History  . Marital Status: Legally Separated    Spouse Name: N/A    Number of Children: N/A  . Years of Education: N/A   Social History Main Topics  . Smoking status: Current Every Day  Smoker -- 1.0 packs/day    Types: Cigarettes  . Smokeless tobacco: Former Neurosurgeon  . Alcohol Use: Yes  . Drug Use: None  . Sexually Active: None   Other Topics Concern  . None   Social History Narrative  . None   Past Surgical History  Procedure Date  . Sphincterotomy    Past Medical History  Diagnosis Date  . Diabetes mellitus   . DDD (degenerative disc disease)   . Allergy   . 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  Respiratory: Positive for shortness of breath and wheezing.   Gastrointestinal: Positive for abdominal pain and diarrhea.  Musculoskeletal: Positive for back pain and arthralgias.  Neurological: Positive for weakness and numbness.  All other systems reviewed and are negative.       Objective:   Physical Exam  Constitutional: He is oriented to person, place, and time. He appears well-developed and well-nourished.  Musculoskeletal:       Thoracic back: Normal.       Lumbar back: He exhibits decreased range of motion, tenderness and pain. He exhibits no deformity and no spasm.  Neurological: He is alert and oriented to person, place, and time. He displays no atrophy. No sensory deficit. He exhibits normal muscle tone. Coordination and gait normal.  Reflex Scores:      Patellar reflexes are 2+ on the right side and 2+ on the left side.      Achilles reflexes are 2+ on the right side and 2+ on the left side.      Await weakness in the right ankle dorsiflexor Otherwise strength is 5/5 in both lower extremities  Psychiatric: He has a normal mood and affect.          Assessment & Plan:  1. Lumbar degenerative disc with Sciatica but no evidence of nerve damage on electromyography testing.as I discussed with the patient, I feel that he is at maximal  medical improvement.  He does reasonably well on the current pain medication regimen which consists of tramadol 100 mg 3 times per day as well as Topamax 50 mg twice a day. Because of GI problems I want him to use Mobic only on flareups.The Mobic only partially helps with his flareups. We will trial Tylenol number 3:30 tablets which needs to last one month. In addition he can continue his cyclobenzaprine 10 mg at night.he states that the current flareup is persisting despite Mobic.  I think he can work at a sedentary work level.  I do not think any further injections are needed  He may have paresthesias from the Topamax however at this point the benefits outweigh the uncomfortable side effect . He can  try taking one half tablet in the morning to see if this helps with his drowsiness  I don't think he had an allergic reaction to baclofen in fact he has had it before without problems. It sounds like he had some type of environmental allergy and has some residual nasal congestion from this. If this does not improve in the next few days, he'll need to see his primary physician I'll see him back in 2 months

## 2012-09-19 ENCOUNTER — Emergency Department: Payer: Self-pay | Admitting: Emergency Medicine

## 2012-09-26 ENCOUNTER — Other Ambulatory Visit: Payer: Self-pay

## 2012-09-26 MED ORDER — TRAZODONE HCL 50 MG PO TABS: 50.0000 mg | ORAL_TABLET | Freq: Every day | ORAL | Status: DC

## 2012-10-04 ENCOUNTER — Ambulatory Visit: Payer: Worker's Compensation | Admitting: Physical Medicine & Rehabilitation

## 2012-10-04 ENCOUNTER — Encounter: Payer: Worker's Compensation | Attending: Physical Medicine & Rehabilitation

## 2012-10-04 DIAGNOSIS — M545 Low back pain, unspecified: Secondary | ICD-10-CM | POA: Insufficient documentation

## 2012-10-04 DIAGNOSIS — E119 Type 2 diabetes mellitus without complications: Secondary | ICD-10-CM | POA: Insufficient documentation

## 2012-10-04 DIAGNOSIS — M51379 Other intervertebral disc degeneration, lumbosacral region without mention of lumbar back pain or lower extremity pain: Secondary | ICD-10-CM | POA: Insufficient documentation

## 2012-10-04 DIAGNOSIS — M5126 Other intervertebral disc displacement, lumbar region: Secondary | ICD-10-CM | POA: Insufficient documentation

## 2012-10-04 DIAGNOSIS — M5137 Other intervertebral disc degeneration, lumbosacral region: Secondary | ICD-10-CM | POA: Insufficient documentation

## 2012-10-04 DIAGNOSIS — M79609 Pain in unspecified limb: Secondary | ICD-10-CM | POA: Insufficient documentation

## 2012-10-12 ENCOUNTER — Emergency Department: Payer: Self-pay | Admitting: Emergency Medicine

## 2012-10-23 ENCOUNTER — Other Ambulatory Visit: Payer: Self-pay | Admitting: Physical Medicine & Rehabilitation

## 2012-10-24 ENCOUNTER — Other Ambulatory Visit: Payer: Self-pay | Admitting: *Deleted

## 2012-10-24 MED ORDER — MELOXICAM 15 MG PO TABS: 15.0000 mg | ORAL_TABLET | Freq: Every day | ORAL | Status: DC

## 2012-11-11 ENCOUNTER — Ambulatory Visit: Payer: Self-pay | Admitting: Physical Medicine & Rehabilitation

## 2012-11-11 ENCOUNTER — Ambulatory Visit: Payer: Worker's Compensation | Admitting: Physical Medicine & Rehabilitation

## 2012-11-11 ENCOUNTER — Telehealth: Payer: Self-pay | Admitting: *Deleted

## 2012-11-11 MED ORDER — ACETAMINOPHEN-CODEINE #3 300-30 MG PO TABS: 1.0000 | ORAL_TABLET | Freq: Four times a day (QID) | ORAL | Status: DC | PRN

## 2012-11-11 NOTE — Telephone Encounter (Signed)
Patient had an appointment today with Dr. [name]. Unable to make appointment. Got held up at the bank concerning his account. Needs refill on Tylenol #3. Patient rescheduled appointment for 11/26/12.

## 2012-11-11 NOTE — Telephone Encounter (Signed)
ptient aware prescription is up front for pick up.

## 2012-11-14 ENCOUNTER — Emergency Department: Payer: Self-pay | Admitting: Emergency Medicine

## 2012-11-26 ENCOUNTER — Other Ambulatory Visit: Payer: Self-pay | Admitting: *Deleted

## 2012-11-26 ENCOUNTER — Ambulatory Visit: Payer: Worker's Compensation | Admitting: Physical Medicine & Rehabilitation

## 2012-11-26 ENCOUNTER — Encounter: Payer: Worker's Compensation | Attending: Physical Medicine & Rehabilitation

## 2012-11-26 DIAGNOSIS — M51379 Other intervertebral disc degeneration, lumbosacral region without mention of lumbar back pain or lower extremity pain: Secondary | ICD-10-CM | POA: Insufficient documentation

## 2012-11-26 DIAGNOSIS — M5137 Other intervertebral disc degeneration, lumbosacral region: Secondary | ICD-10-CM | POA: Insufficient documentation

## 2012-11-26 DIAGNOSIS — M545 Low back pain, unspecified: Secondary | ICD-10-CM | POA: Insufficient documentation

## 2012-11-26 DIAGNOSIS — M79609 Pain in unspecified limb: Secondary | ICD-10-CM | POA: Insufficient documentation

## 2012-11-26 DIAGNOSIS — E119 Type 2 diabetes mellitus without complications: Secondary | ICD-10-CM | POA: Insufficient documentation

## 2012-11-26 DIAGNOSIS — M5126 Other intervertebral disc displacement, lumbar region: Secondary | ICD-10-CM | POA: Insufficient documentation

## 2012-11-26 MED ORDER — MELOXICAM 15 MG PO TABS: 15.0000 mg | ORAL_TABLET | Freq: Every day | ORAL | Status: DC

## 2012-11-26 MED ORDER — TRAMADOL HCL 50 MG PO TABS: 100.0000 mg | ORAL_TABLET | Freq: Three times a day (TID) | ORAL | Status: DC | PRN

## 2012-12-17 ENCOUNTER — Telehealth: Payer: Self-pay

## 2012-12-17 MED ORDER — ACETAMINOPHEN-CODEINE #3 300-30 MG PO TABS: 1.0000 | ORAL_TABLET | Freq: Four times a day (QID) | ORAL | Status: DC | PRN

## 2012-12-17 NOTE — Telephone Encounter (Signed)
Refill request for tylenol #3

## 2012-12-17 NOTE — Telephone Encounter (Signed)
Prescription was called into [facility] in [place]. Left message on patients Identified Voice mail.

## 2012-12-23 ENCOUNTER — Encounter: Payer: Worker's Compensation | Attending: Physical Medicine & Rehabilitation

## 2012-12-23 ENCOUNTER — Ambulatory Visit: Payer: Worker's Compensation | Admitting: Physical Medicine & Rehabilitation

## 2012-12-23 ENCOUNTER — Ambulatory Visit (HOSPITAL_BASED_OUTPATIENT_CLINIC_OR_DEPARTMENT_OTHER): Payer: Worker's Compensation | Admitting: Physical Medicine & Rehabilitation

## 2012-12-23 ENCOUNTER — Encounter: Payer: Self-pay | Admitting: Physical Medicine & Rehabilitation

## 2012-12-23 VITALS — BP 128/61 | HR 88 | Resp 14 | Ht 69.0 in | Wt 238.0 lb

## 2012-12-23 DIAGNOSIS — M79609 Pain in unspecified limb: Secondary | ICD-10-CM | POA: Insufficient documentation

## 2012-12-23 DIAGNOSIS — M5137 Other intervertebral disc degeneration, lumbosacral region: Secondary | ICD-10-CM

## 2012-12-23 DIAGNOSIS — M545 Low back pain, unspecified: Secondary | ICD-10-CM | POA: Insufficient documentation

## 2012-12-23 DIAGNOSIS — M51379 Other intervertebral disc degeneration, lumbosacral region without mention of lumbar back pain or lower extremity pain: Secondary | ICD-10-CM | POA: Insufficient documentation

## 2012-12-23 DIAGNOSIS — E119 Type 2 diabetes mellitus without complications: Secondary | ICD-10-CM | POA: Insufficient documentation

## 2012-12-23 DIAGNOSIS — Z5181 Encounter for therapeutic drug level monitoring: Secondary | ICD-10-CM

## 2012-12-23 DIAGNOSIS — M5136 Other intervertebral disc degeneration, lumbar region: Secondary | ICD-10-CM

## 2012-12-23 DIAGNOSIS — M5126 Other intervertebral disc displacement, lumbar region: Secondary | ICD-10-CM | POA: Insufficient documentation

## 2012-12-23 DIAGNOSIS — M549 Dorsalgia, unspecified: Secondary | ICD-10-CM

## 2012-12-23 DIAGNOSIS — M5430 Sciatica, unspecified side: Secondary | ICD-10-CM

## 2012-12-23 MED ORDER — MELOXICAM 15 MG PO TABS: 15.0000 mg | ORAL_TABLET | Freq: Every day | ORAL | Status: DC

## 2012-12-23 MED ORDER — TRAMADOL HCL 50 MG PO TABS: 100.0000 mg | ORAL_TABLET | Freq: Three times a day (TID) | ORAL | Status: DC | PRN

## 2012-12-23 MED ORDER — CYCLOBENZAPRINE HCL 10 MG PO TABS: 10.0000 mg | ORAL_TABLET | Freq: Three times a day (TID) | ORAL | Status: DC | PRN

## 2012-12-23 MED ORDER — TOPIRAMATE 50 MG PO TABS: 50.0000 mg | ORAL_TABLET | Freq: Two times a day (BID) | ORAL | Status: DC

## 2012-12-23 NOTE — Patient Instructions (Signed)
Continue current medications Recheck in 3 months. 

## 2012-12-23 NOTE — Progress Notes (Signed)
Subjective:    Patient ID: Joel Page, male    DOB: Mar 26, 1971, 42 y.o.   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 He initially was treated in the [facility], but then followed up at the [facility]. At [facility], he saw Dr. Ruthann Cancer. He was treated  with physical therapy as well as medications. He had an MRI at [facility],  Mar 21, 2011, degenerative disk disease L4-5 and L5-S1, mild neural  foraminal narrowing at L5-S1. This was referred to Dr. Golda Acre note  from Apr 03, 2011. He received tramadol as well as Flexeril for pain.  His relief from Flexeril was not particularly good, tramadol was  helpful, although, when he kept on it for a long period of time, it  became less effective. He was also seen by Dr. Merri Ray from  Physical Medicine Rehabilitation. He was recommended to go back to  light duty work on March 30. He had an MRI at [facility], October 19, 2010 showing small central L4-5 disk protrusion, mild central canal  narrowing, shallow rightward eccentric L5-S1 disk bulge without canal or  foraminal stenosis.  The patient reportedly had an FCE done some time after he saw Dr.  Yves Dill. The report is not available to me at this time. He did have  an epidural injection performed, November 30, 2010 at [facility],  L4-5 translaminar approach, right paramedian. He states that he  developed numbness several hours later which made it difficult for him  to walk in the grocery store with his wife. The patient's complaints  with Dr. Yves Dill were mainly axial back pain as well as radiculitis with  Dr. Gerrit Heck, mainly axial back pain. Other treatments included  diclofenac which was not helpful, Celebrex mild relief, Skelaxin no  relief, Flexeril per Dr. Yves Dill is good relief but then he states  that  today did not have good relief, tramadol some relief. He had  constipation with Percocet. I have also reviewed notes from [facility] in 2011. He also had a neurosurgical evaluation with  Dr. Dorian Heckle on July 10, 2011. Surgery was not recommended.  MRI was described as mild lumbar degenerative disk changes without overt  compressive structural abnormality. Patient complains of itching not sure whether it's Topamax or tramadol  Pain Inventory Average Pain 8 Pain Right Now 9 My pain is sharp, burning, stabbing, tingling and aching  In the last 24 hours, has pain interfered with the following? General activity 9 Relation with others 10 Enjoyment of life 10 What TIME of day is your pain at its worst? morning, evening and night Sleep (in general) Poor  Pain is worse with: walking, bending, sitting, standing and some activites Pain improves with: heat/ice and medication Relief from Meds: 4  Mobility walk without assistance how many minutes can you walk? 10 ability to climb steps?  yes do you drive?  yes Do you have any goals in this area?  yes  Function employed # of Metallurgist I need assistance with the following:  dressing and household duties  Neuro/Psych bladder control problems numbness tingling trouble walking spasms anxiety  Prior Studies Any changes since last visit?  no  Physicians involved in your care Any changes since last visit?  no  Family History  Problem Relation Age of Onset  . Cancer Mother   . Dementia Father   . COPD Father    History   Social History  . Marital Status: Legally Separated    Spouse Name: N/A    Number of Children: N/A  . Years of Education: N/A   Social History Main Topics  . Smoking status: Current Every Day Smoker -- 1.0 packs/day    Types: Cigarettes  . Smokeless tobacco: Former Neurosurgeon  . Alcohol Use: Yes  . Drug Use: None  . Sexually Active: None   Other  Topics Concern  . None   Social History Narrative  . None   Past Surgical History  Procedure Date  . Sphincterotomy    Past Medical History  Diagnosis Date  . Diabetes mellitus   . DDD (degenerative disc disease)   . Allergy   . Retinopathy due to secondary diabetes 06/24/12    both eyes   BP 128/61  Pulse 88  Resp 14  Ht 5\' 9"  (1.753 m)  Wt 238 lb (107.956 kg)  BMI 35.15 kg/m2  SpO2 96%     Review of Systems  Constitutional: Positive for diaphoresis and unexpected weight change.  Respiratory: Positive for cough, shortness of breath and wheezing.   Gastrointestinal: Positive for nausea and diarrhea.  Genitourinary: Positive for difficulty urinating.  Musculoskeletal: Positive for gait problem.  Neurological: Positive for numbness.  All other systems reviewed and are negative.       Objective:   Physical Exam  Constitutional: He is oriented to person, place, and time. He appears well-developed and well-nourished.  HENT:  Head: Normocephalic and atraumatic.  Eyes: Conjunctivae normal are normal. Pupils are equal, round, and reactive to light.  Musculoskeletal:       Lumbar back: He exhibits decreased range of motion and tenderness.       Tenderness right PSIS Negative SLR  Neurological: He is alert and oriented to person, place, and time. He has normal strength and normal reflexes.       Right L4 and right L5 decreased sensation  Psychiatric: He has a normal mood and affect.          Assessment & Plan:   1. Lumbar degenerative disc with Sciatica but no evidence of nerve damage on electromyography testing.as I discussed with the patient, I feel that he is at maximal medical improvement. He does reasonably well on the current pain medication regimen which consists of tramadol 100 mg 3 times per day . Because of GI problems I want him to use Mobic only on flareups.The Mobic only partially helps with his flareups. We will trial Tylenol number 3:300 tablets which  needs to last one month. I think he can work at a sedentary work level. He is working at [employer] facility I do not think any further injections are needed  Stop Topamax due to itching, Continue tramadol    Over half of the 25 min visit was spent counseling and coordinating care.

## 2012-12-27 ENCOUNTER — Emergency Department: Payer: Self-pay | Admitting: Emergency Medicine

## 2013-01-15 ENCOUNTER — Other Ambulatory Visit: Payer: Self-pay | Admitting: Physical Medicine & Rehabilitation

## 2013-01-16 NOTE — Telephone Encounter (Signed)
Tylenol #3 called in.

## 2013-02-11 ENCOUNTER — Other Ambulatory Visit: Payer: Self-pay

## 2013-02-11 MED ORDER — TRAMADOL HCL 50 MG PO TABS: 100.0000 mg | ORAL_TABLET | Freq: Three times a day (TID) | ORAL | Status: DC | PRN

## 2013-02-14 ENCOUNTER — Telehealth: Payer: Self-pay

## 2013-02-14 MED ORDER — ACETAMINOPHEN-CODEINE #3 300-30 MG PO TABS: ORAL_TABLET | ORAL | Status: DC

## 2013-02-14 NOTE — Telephone Encounter (Signed)
Patient called requesting tylenol #3 refill.  Left message advising patient medication has been called in.

## 2013-03-12 ENCOUNTER — Emergency Department: Payer: Self-pay | Admitting: Emergency Medicine

## 2013-03-21 ENCOUNTER — Encounter: Payer: Worker's Compensation | Attending: Physical Medicine & Rehabilitation

## 2013-03-21 ENCOUNTER — Encounter: Payer: Self-pay | Admitting: Physical Medicine & Rehabilitation

## 2013-03-21 ENCOUNTER — Ambulatory Visit (HOSPITAL_BASED_OUTPATIENT_CLINIC_OR_DEPARTMENT_OTHER): Payer: Worker's Compensation | Admitting: Physical Medicine & Rehabilitation

## 2013-03-21 VITALS — BP 144/80 | HR 95 | Resp 16 | Ht 69.0 in | Wt 239.0 lb

## 2013-03-21 DIAGNOSIS — M51379 Other intervertebral disc degeneration, lumbosacral region without mention of lumbar back pain or lower extremity pain: Secondary | ICD-10-CM | POA: Insufficient documentation

## 2013-03-21 DIAGNOSIS — M79609 Pain in unspecified limb: Secondary | ICD-10-CM | POA: Insufficient documentation

## 2013-03-21 DIAGNOSIS — M5430 Sciatica, unspecified side: Secondary | ICD-10-CM

## 2013-03-21 DIAGNOSIS — M5137 Other intervertebral disc degeneration, lumbosacral region: Secondary | ICD-10-CM | POA: Insufficient documentation

## 2013-03-21 DIAGNOSIS — M5126 Other intervertebral disc displacement, lumbar region: Secondary | ICD-10-CM | POA: Insufficient documentation

## 2013-03-21 DIAGNOSIS — M545 Low back pain, unspecified: Secondary | ICD-10-CM | POA: Insufficient documentation

## 2013-03-21 DIAGNOSIS — E119 Type 2 diabetes mellitus without complications: Secondary | ICD-10-CM | POA: Insufficient documentation

## 2013-03-21 DIAGNOSIS — M5431 Sciatica, right side: Secondary | ICD-10-CM

## 2013-03-21 DIAGNOSIS — M5136 Other intervertebral disc degeneration, lumbar region: Secondary | ICD-10-CM

## 2013-03-21 MED ORDER — CYCLOBENZAPRINE HCL 10 MG PO TABS: 10.0000 mg | ORAL_TABLET | Freq: Three times a day (TID) | ORAL | Status: DC | PRN

## 2013-03-21 MED ORDER — ACETAMINOPHEN-CODEINE #3 300-30 MG PO TABS: ORAL_TABLET | ORAL | Status: DC

## 2013-03-21 MED ORDER — TRAZODONE HCL 50 MG PO TABS: 50.0000 mg | ORAL_TABLET | Freq: Every day | ORAL | Status: DC

## 2013-03-21 MED ORDER — TRAMADOL HCL 50 MG PO TABS: 100.0000 mg | ORAL_TABLET | Freq: Three times a day (TID) | ORAL | Status: DC | PRN

## 2013-03-21 NOTE — Progress Notes (Signed)
Subjective:    Patient ID: Joel Page, male    DOB: 02/25/1971, 42 y.o.   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 He initially was treated in the [facility], but then followed up at the [facility]. At [facility], he saw Dr. Ruthann Cancer. He was treated  with physical therapy as well as medications. He had an MRI at [facility],  Mar 21, 2011, degenerative disk disease L4-5 and L5-S1, mild neural  foraminal narrowing at L5-S1. This was referred to Dr. Golda Acre note  from Apr 03, 2011. He received tramadol as well as Flexeril for pain.  His relief from Flexeril was not particularly good, tramadol was  helpful, although, when he kept on it for a long period of time, it  became less effective. He was also seen by Dr. Merri Ray from  Physical Medicine Rehabilitation. He was recommended to go back to  light duty work on March 30. He had an MRI at [facility], October 19, 2010 showing small central L4-5 disk protrusion, mild central canal  narrowing, shallow rightward eccentric L5-S1 disk bulge without canal or  foraminal stenosis.  The patient reportedly had an FCE done some time after he saw Dr.  Yves Dill. The report is not available to me at this time. He did have  an epidural injection performed, November 30, 2010 at [facility],  L4-5 translaminar approach, right paramedian. He states that he  developed numbness several hours later which made it difficult for him  to walk in the grocery store with his wife. The patient's complaints  with Dr. Yves Dill were mainly axial back pain as well as radiculitis with  Dr. Gerrit Heck, mainly axial back pain. Other treatments included  diclofenac which was not helpful, Celebrex mild relief, Skelaxin no  relief, Flexeril per Dr. Yves Dill is good relief but then he states  that  today did not have good relief, tramadol some relief. He had  constipation with Percocet. I have also reviewed notes from [facility] in 2011. He also had a neurosurgical evaluation with  Dr. Dorian Heckle on July 10, 2011. Surgery was not recommended.  MRI was described as mild lumbar degenerative disk changes without overt  compressive structural abnormality.  Last visit 3 mo ago had itching stopped topamax, Also discussed itching could not identify any particular medicine for sure. Does not appear to be due to Topamax because he restarted this and did not have any itching.  Has been seeing [facility] for neck problems. They are suggesting surgery for neck. Pain Inventory Average Pain 10 Pain Right Now 10 My pain is sharp, burning, stabbing, tingling and aching  In the last 24 hours, has pain interfered with the following? General activity 10 Relation with others 10 Enjoyment of life 10 What TIME of day is your pain at its worst? day,evening,night Sleep (in general) n/a  Pain is worse with: walking, bending, sitting, inactivity, standing and some activites Pain improves with: heat/ice and medication Relief from Meds: n/a  Mobility walk with assistance how many minutes can you walk? 5 ability to climb steps?  no do you drive?  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  yes  Neuro/Psych bladder control problems bowel control problems weakness numbness tingling trouble walking spasms  Prior Studies Any changes since last visit?  no  Physicians involved in your care Any changes since last visit?  no   Family History  Problem Relation Age of Onset  . Cancer Mother   . Dementia Father   . COPD Father    History   Social History  . Marital Status: Legally Separated    Spouse Name: N/A    Number of Children: N/A  . Years of Education: N/A   Social  History Main Topics  . Smoking status: Current Every Day Smoker -- 1.00 packs/day    Types: Cigarettes  . Smokeless tobacco: Former Neurosurgeon  . Alcohol Use: Yes  . Drug Use: None  . Sexually Active: None   Other Topics Concern  . None   Social History Narrative  . None   Past Surgical History  Procedure Laterality Date  . Sphincterotomy     Past Medical History  Diagnosis Date  . Diabetes mellitus   . DDD (degenerative disc disease)   . Allergy   . Retinopathy due to secondary diabetes 06/24/12    both eyes   BP 144/80  Pulse 95  Resp 16  Ht 5\' 9"  (1.753 m)  Wt 239 lb (108.41 kg)  BMI 35.28 kg/m2  SpO2 98%     Review of Systems  Genitourinary:       Urine retention   Musculoskeletal: Positive for gait problem.  Neurological: Positive for weakness and numbness.       Spasms, tingling.   All other systems reviewed and are negative.       Objective:   Physical Exam  Constitutional: He is oriented to person, place, and time.  Neurological: He is alert and oriented to person, place, and time. He has normal strength. No cranial nerve deficit or sensory deficit.  Reflex Scores:      Patellar reflexes are 1+ on the right side and 1+ on the left side.      Achilles reflexes are 1+ on the right side and 0 on the left side.         Assessment & Plan:  1. Lumbar degenerative disc with Sciatica but no evidence of nerve damage on electromyography testing.as I discussed with the patient, I feel that he is at maximal medical improvement. He does reasonably well on the current pain medication regimen which consists of tramadol 100 mg 3 times per day . Because of GI problems I want him to use Mobic only on flareups.The Mobic only partially helps with his flareups. We will trial Tylenol number 60 3:300 tablets which needs to last one month. I think he can work at a sedentary work level. He is working at [employer] facility  I do not think any further injections are needed   I don't think he has had any progression of his lumbar stenosis, one year since last MRI, he is working Clinical cytogeneticist.  SOme problems with coping with his chronic pain  Continue tramadol  Over half of the 25 min visit was spent counseling and coordinating care. 2. Cervical spondylosis, scheduled for ACDF. This is not related to his workers comp case. He is complaining of bladder problems which may be related to this issue. Based on his last MRI I do not think his bladder problems which sound like retention are secondary to lumbar spine disease

## 2013-03-25 ENCOUNTER — Telehealth: Payer: Self-pay

## 2013-03-25 NOTE — Telephone Encounter (Signed)
Meloxicam refill request

## 2013-03-25 NOTE — Telephone Encounter (Signed)
Ok for refill, isn't this something thst doesn't require my ok?

## 2013-03-26 MED ORDER — MELOXICAM 15 MG PO TABS: 15.0000 mg | ORAL_TABLET | Freq: Every day | ORAL | Status: DC

## 2013-04-18 ENCOUNTER — Emergency Department: Payer: Self-pay | Admitting: Emergency Medicine

## 2013-04-22 ENCOUNTER — Encounter: Payer: Worker's Compensation | Attending: Physical Medicine & Rehabilitation

## 2013-04-22 ENCOUNTER — Encounter: Payer: Self-pay | Admitting: Physical Medicine & Rehabilitation

## 2013-04-22 ENCOUNTER — Ambulatory Visit (HOSPITAL_BASED_OUTPATIENT_CLINIC_OR_DEPARTMENT_OTHER): Payer: Worker's Compensation | Admitting: Physical Medicine & Rehabilitation

## 2013-04-22 VITALS — BP 102/65 | HR 98 | Resp 14 | Ht 69.0 in | Wt 239.6 lb

## 2013-04-22 DIAGNOSIS — Z79899 Other long term (current) drug therapy: Secondary | ICD-10-CM

## 2013-04-22 DIAGNOSIS — M51379 Other intervertebral disc degeneration, lumbosacral region without mention of lumbar back pain or lower extremity pain: Secondary | ICD-10-CM | POA: Insufficient documentation

## 2013-04-22 DIAGNOSIS — E119 Type 2 diabetes mellitus without complications: Secondary | ICD-10-CM | POA: Insufficient documentation

## 2013-04-22 DIAGNOSIS — M545 Low back pain, unspecified: Secondary | ICD-10-CM | POA: Insufficient documentation

## 2013-04-22 DIAGNOSIS — M5137 Other intervertebral disc degeneration, lumbosacral region: Secondary | ICD-10-CM | POA: Insufficient documentation

## 2013-04-22 DIAGNOSIS — M79609 Pain in unspecified limb: Secondary | ICD-10-CM | POA: Insufficient documentation

## 2013-04-22 DIAGNOSIS — M5126 Other intervertebral disc displacement, lumbar region: Secondary | ICD-10-CM | POA: Insufficient documentation

## 2013-04-22 DIAGNOSIS — M549 Dorsalgia, unspecified: Secondary | ICD-10-CM

## 2013-04-22 DIAGNOSIS — M25551 Pain in right hip: Secondary | ICD-10-CM

## 2013-04-22 DIAGNOSIS — M5136 Other intervertebral disc degeneration, lumbar region: Secondary | ICD-10-CM

## 2013-04-22 DIAGNOSIS — Z5181 Encounter for therapeutic drug level monitoring: Secondary | ICD-10-CM

## 2013-04-22 DIAGNOSIS — M25559 Pain in unspecified hip: Secondary | ICD-10-CM

## 2013-04-22 MED ORDER — ACETAMINOPHEN-CODEINE #3 300-30 MG PO TABS: ORAL_TABLET | ORAL | Status: DC

## 2013-04-22 NOTE — Patient Instructions (Signed)
I believe the majority of your groin and thigh pain are related to your hip.  I can only treat your back problem.  Please follow the instructions on the ED note to see an orthopedist for your R hip problem  Urine drug screen today as well as signing controlled substance agreement  If you have a procedure and orthopedics wants to give you additional pain meds please call our office

## 2013-04-22 NOTE — Progress Notes (Signed)
Subjective:    Patient ID: Joel Page, male    DOB: Mar 17, 1971, 42 y.o.   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 He initially was treated in the [facility], but then followed up at the [facility]. At [facility], he saw Dr. Ruthann Cancer. He was treated  with physical therapy as well as medications. He had an MRI at [facility],  Mar 21, 2011, degenerative disk disease L4-5 and L5-S1, mild neural  foraminal narrowing at L5-S1. This was referred to Dr. Golda Acre note  from Apr 03, 2011. He received tramadol as well as Flexeril for pain.  His relief from Flexeril was not particularly good, tramadol was  helpful, although, when he kept on it for a long period of time, it  became less effective. He was also seen by Dr. Merri Ray from  [facility]. He was recommended to go back to  light duty work on March 30. He had an MRI at [facility], October 19, 2010 showing small central L4-5 disk protrusion, mild central canal  narrowing, shallow rightward eccentric L5-S1 disk bulge without canal or  foraminal stenosis.  The patient reportedly had an FCE done some time after he saw Dr.  Yves Dill. The report is not available to me at this time. He did have  an epidural injection performed, November 30, 2010 at [facility],  L4-5 translaminar approach, right paramedian. He states that he  developed numbness several hours later which made it difficult for him  to walk in the grocery store with his wife. The patient's complaints  with Dr. Yves Dill were mainly axial back pain as well as radiculitis with  Dr. Gerrit Heck, mainly axial back pain. Other treatments included  diclofenac which was not helpful, Celebrex mild relief, Skelaxin no  relief, Flexeril per Dr. Yves Dill is good relief but then he states that   today did not have good relief, tramadol some relief. He had  constipation with Percocet. I have also reviewed notes from [facility] in 2011. He also had a neurosurgical evaluation with  Dr. Dorian Heckle on July 10, 2011. Surgery was not recommended.  MRI was described as mild lumbar degenerative disk changes without overt  compressive structural abnormality.  HPI Stepping off porch fell, had been having pain prior to that time.  Pain easing off some Had some groin pain after fall Seen by ED Hip Xrays reported as negative for fx or dislocation Pain Inventory Average Pain 8 Pain Right Now 10 My pain is sharp, burning, stabbing and aching  In the last 24 hours, has pain interfered with the following? General activity 10 Relation with others 10 Enjoyment of life 10 What TIME of day is your pain at its worst? all Sleep (in general) Fair  Pain is worse with: walking, bending, sitting, inactivity and standing Pain improves with: medication Relief from Meds: 2  Mobility walk without assistance how many minutes can you walk? 5 ability to climb steps?  no do you drive?  yes  Function employed # of hrs/week 40 I need assistance with the following:  dressing, household duties and shopping  Neuro/Psych bladder control problems bowel control problems weakness numbness tingling trouble walking spasms  Prior Studies Any changes since last visit?  yes x-rays  Physicians involved in your  care Any changes since last visit?  no   Family History  Problem Relation Age of Onset  . Cancer Mother   . Dementia Father   . COPD Father    History   Social History  . Marital Status: Legally Separated    Spouse Name: N/A    Number of Children: N/A  . Years of Education: N/A   Social History Main Topics  . Smoking status: Current Every Day Smoker -- 1.00 packs/day    Types: Cigarettes  . Smokeless tobacco: Former Neurosurgeon  . Alcohol Use: Yes  . Drug  Use: None  . Sexually Active: None   Other Topics Concern  . None   Social History Narrative  . None   Past Surgical History  Procedure Laterality Date  . Sphincterotomy     Past Medical History  Diagnosis Date  . Diabetes mellitus   . DDD (degenerative disc disease)   . Allergy   . Retinopathy due to secondary diabetes 06/24/12    both eyes   BP 102/65  Pulse 98  Resp 14  Ht 5\' 9"  (1.753 m)  Wt 239 lb 9.6 oz (108.682 kg)  BMI 35.37 kg/m2  SpO2 96%    Review of Systems  Constitutional: Positive for diaphoresis and unexpected weight change.  Gastrointestinal:       Bowel control  Genitourinary: Positive for difficulty urinating.       Bladder control  Musculoskeletal: Positive for back pain and gait problem.       Hip pain spasms  Neurological: Positive for weakness and numbness.       Tingling  All other systems reviewed and are negative.       Objective:   Physical Exam  Nursing note and vitals reviewed. Constitutional: He is oriented to person, place, and time. He appears well-developed and well-nourished.  HENT:  Head: Normocephalic and atraumatic.  Eyes: Conjunctivae and EOM are normal. Pupils are equal, round, and reactive to light.  Musculoskeletal:       Right hip: He exhibits decreased range of motion and decreased strength. He exhibits no crepitus and no deformity.  Severe pain with hip internal and external rotation on the right side only  Neurological: He is alert and oriented to person, place, and time. He has normal strength. He displays no atrophy and no tremor. No sensory deficit. He exhibits normal muscle tone. Coordination and gait normal.  Reflex Scores:      Patellar reflexes are 1+ on the right side and 2+ on the left side.      Achilles reflexes are 2+ on the right side and 2+ on the left side. Psychiatric: His affect is blunt.          Assessment & Plan:  1. Lumbar degenerative disc with chronic pain mainly managed with  nonnarcotic medicines however he does have some Tylenol 3 for breakthrough. Will need to sign a controlled substance agreement and check urine drug screen since this appears to be a more chronic medication.  2. Right hip pain question etiology he states that it started to hurt even before he fell off his porch. X-rays in the emergency department were negative. I've instructed him to followup with an orthopedic physician since this does not relate to his lumbar spine. I wrote a work note for today only. RTC 2 months

## 2013-04-25 ENCOUNTER — Telehealth: Payer: Self-pay

## 2013-04-25 NOTE — Telephone Encounter (Signed)
Please clarify dose and days for dosepak.

## 2013-04-25 NOTE — Telephone Encounter (Signed)
Medrol Dosepak is a standard dosage that you just order it consists of methylprednisolone

## 2013-04-25 NOTE — Telephone Encounter (Signed)
May call in a Medrol Dosepak no refills take as directed

## 2013-04-25 NOTE — Telephone Encounter (Signed)
Patient called requesting prednisone.  He is having trouble sleeping and needs something for inflammation.  Please advise.

## 2013-04-30 MED ORDER — METHYLPREDNISOLONE 4 MG PO KIT: PACK | ORAL | Status: DC

## 2013-04-30 NOTE — Telephone Encounter (Signed)
Left message informing patient dosepak was sent to pharmacy.

## 2013-05-16 ENCOUNTER — Ambulatory Visit: Payer: Self-pay | Admitting: Physical Medicine & Rehabilitation

## 2013-05-19 ENCOUNTER — Telehealth: Payer: Self-pay

## 2013-05-19 NOTE — Telephone Encounter (Signed)
Message copied by Judd Gaudier on Mon May 19, 2013  4:32 PM ------      Message from: Su Monks      Created: Tue May 13, 2013  2:35 PM       Please educate patient , he signed the contract after the UDS, if this happens again after he signed he will be d/c ------

## 2013-05-19 NOTE — Telephone Encounter (Signed)
Left message for patient to call office regarding alcohol and narcotic.

## 2013-05-20 NOTE — Telephone Encounter (Signed)
I spoke with Joel Page and educated about the alcohol in his UDS and told him that if he has another positive UDS with alcohol Dr [name] will no longer prescribe narcotics and could discharge him from the clinic. He acknowledges understanding.

## 2013-05-21 ENCOUNTER — Telehealth: Payer: Self-pay

## 2013-05-21 MED ORDER — ACETAMINOPHEN-CODEINE #3 300-30 MG PO TABS: ORAL_TABLET | ORAL | Status: DC

## 2013-05-21 NOTE — Telephone Encounter (Signed)
Patient called requesting tylenol #3 and tramadol refill.  Tramdol still has refills and tylenol was called in.  Unable to contact patient.

## 2013-06-17 ENCOUNTER — Encounter: Payer: Self-pay | Admitting: Physical Medicine & Rehabilitation

## 2013-06-17 ENCOUNTER — Encounter: Payer: Worker's Compensation | Attending: Physical Medicine & Rehabilitation

## 2013-06-17 ENCOUNTER — Telehealth: Payer: Self-pay | Admitting: Physical Medicine & Rehabilitation

## 2013-06-17 ENCOUNTER — Ambulatory Visit (HOSPITAL_BASED_OUTPATIENT_CLINIC_OR_DEPARTMENT_OTHER): Payer: Worker's Compensation | Admitting: Physical Medicine & Rehabilitation

## 2013-06-17 VITALS — BP 123/77 | HR 80 | Resp 14 | Ht 69.0 in | Wt 240.6 lb

## 2013-06-17 DIAGNOSIS — M79609 Pain in unspecified limb: Secondary | ICD-10-CM | POA: Insufficient documentation

## 2013-06-17 DIAGNOSIS — M545 Low back pain, unspecified: Secondary | ICD-10-CM | POA: Insufficient documentation

## 2013-06-17 DIAGNOSIS — M5136 Other intervertebral disc degeneration, lumbar region: Secondary | ICD-10-CM

## 2013-06-17 DIAGNOSIS — M7061 Trochanteric bursitis, right hip: Secondary | ICD-10-CM | POA: Insufficient documentation

## 2013-06-17 DIAGNOSIS — E119 Type 2 diabetes mellitus without complications: Secondary | ICD-10-CM | POA: Insufficient documentation

## 2013-06-17 DIAGNOSIS — M51379 Other intervertebral disc degeneration, lumbosacral region without mention of lumbar back pain or lower extremity pain: Secondary | ICD-10-CM | POA: Insufficient documentation

## 2013-06-17 DIAGNOSIS — M5137 Other intervertebral disc degeneration, lumbosacral region: Secondary | ICD-10-CM

## 2013-06-17 DIAGNOSIS — M5126 Other intervertebral disc displacement, lumbar region: Secondary | ICD-10-CM | POA: Insufficient documentation

## 2013-06-17 DIAGNOSIS — M76899 Other specified enthesopathies of unspecified lower limb, excluding foot: Secondary | ICD-10-CM

## 2013-06-17 MED ORDER — TIZANIDINE HCL 4 MG PO TABS: 4.0000 mg | ORAL_TABLET | Freq: Four times a day (QID) | ORAL | Status: DC | PRN

## 2013-06-17 MED ORDER — ACETAMINOPHEN-CODEINE #3 300-30 MG PO TABS: ORAL_TABLET | ORAL | Status: DC

## 2013-06-17 NOTE — Progress Notes (Addendum)
Subjective:    Patient ID: Joel Page, male    DOB: 1971-06-29, 42 y.o.   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 He initially was treated in the [facility], but then followed up at the [facility]. At [facility], he saw Dr. Ruthann Cancer. He was treated  with physical therapy as well as medications. He had an MRI at [facility],  Mar 21, 2011, degenerative disk disease L4-5 and L5-S1, mild neural  foraminal narrowing at L5-S1. This was referred to Dr. Golda Acre note  from Apr 03, 2011. He received tramadol as well as Flexeril for pain.  His relief from Flexeril was not particularly good, tramadol was  helpful, although, when he kept on it for a long period of time, it  became less effective. He was also seen by Dr. Merri Ray from  [facility]. He was recommended to go back to  light duty work on March 30. He had an MRI at [facility], October 19, 2010 showing small central L4-5 disk protrusion, mild central canal  narrowing, shallow rightward eccentric L5-S1 disk bulge without canal or  foraminal stenosis.   HPI Complaints of right hip pain. He is unable to sleep on his right side. No falls no trauma to that area. No groin pain. poor sleep overall. Also complains that his chair at work was changed and it is less comfortable now. Continues to back pain as well as leg spasms right greater than left. He has been taking cyclobenzaprine but he had some Zanaflex at home which seemed to work better. Continues to use Tylenol 3 in combination with tramadol seems to work best. Pain Inventory Average Pain 9 Pain Right Now 9 My pain is constant, sharp, burning, dull, stabbing, tingling and aching  In the last 24 hours, has pain interfered with the following? General activity 10 Relation with others  10 Enjoyment of life 10 What TIME of day is your pain at its worst? morning daytime and night Sleep (in general) Poor  Pain is worse with: walking, bending, sitting, inactivity, standing and some activites Pain improves with: rest, heat/ice, pacing activities and medication Relief from Meds: 1  Mobility walk without assistance walk with assistance use a cane how many minutes can you walk? 30 ability to climb steps?  yes do you drive?  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  yes  Physicians involved in your care Any changes since last visit?  no   Family History  Problem Relation Age of Onset  . Cancer Mother   . Dementia Father   . COPD Father    History   Social History  . Marital Status: Legally Separated    Spouse Name: N/A    Number of Children: N/A  . Years of Education: N/A   Social History Main Topics  . Smoking status: Current Every Day Smoker -- 1.00 packs/day    Types: Cigarettes  . Smokeless tobacco: Former Neurosurgeon  . Alcohol Use: Yes  . Drug Use: None  . Sexually Active: None   Other Topics Concern  . None   Social History Narrative  .  None   Past Surgical History  Procedure Laterality Date  . Sphincterotomy     Past Medical History  Diagnosis Date  . Diabetes mellitus   . DDD (degenerative disc disease)   . Allergy   . Retinopathy due to secondary diabetes 06/24/12    both eyes   BP 123/77  Pulse 80  Resp 14  Ht 5\' 9"  (1.753 m)  Wt 240 lb 9.6 oz (109.135 kg)  BMI 35.51 kg/m2  SpO2 96%    Review of Systems  Constitutional: Positive for diaphoresis and unexpected weight change.  Respiratory: Positive for cough, shortness of breath and wheezing.   Gastrointestinal: Positive for nausea.       Bowel Control  Problems  Genitourinary: Positive for difficulty urinating.       Bladder control problems  Musculoskeletal: Positive for gait problem.       Spasms, leg pain  Neurological: Positive for tremors, weakness and numbness.       Tingling  Psychiatric/Behavioral: Positive for sleep disturbance. The patient is nervous/anxious.   All other systems reviewed and are negative.       Objective:   Physical Exam Tenderness over the right great trochanter of femur Lumbar spine tenderness palpation bilateral L5 and S1 Straight leg raising test is negative Motor strength in the lower extremities is 5/5 Deep tendon reflexes are normal in the lower extremities Mood and affect are appropriate Ambulation is normal         Assessment & Plan:  1. Lumbar degenerative disc with chronic pain mainly managed with nonnarcotic medicines however he does have some Tylenol 3 for breakthrough.  No signs of abuse 2. Right hip pain trochanteric bursitis, no improvement with NSAIDs for >79mo  RTC 3 months  Trochanteric bursa injection  without ultrasound guidance  Indication Trochanteric bursitis. Exam has tenderness over the greater trochanter of the hip. Pain has not responded to conservative care such as exercise therapy and oral medications. Pain interferes with sleep or with mobility Informed consent was obtained after describing risks and benefits of the procedure with the patient these include bleeding bruising and infection. Patient has signed written consent form. Patient placed in a lateral decubitus position with the affected hip superior. Point of maximal pain was palpated marked and prepped with Betadine and entered with a needle to bone contact. Needle slightly withdrawn then 40 mg Depo-Medrol with 4 cc 1% lidocaine were injected. Patient tolerated procedure well. Post procedure instructions given.  Over half of the 25 min visit was spent counseling and coordinating care. Discussed numerous   concerns

## 2013-06-17 NOTE — Telephone Encounter (Signed)
At 924am: shranda called from [facility] needing clarification on tiZANidine (ZANAFLEX) 4 MG tablet She states they are two different directions.. pls call [phone]

## 2013-06-18 MED ORDER — TIZANIDINE HCL 4 MG PO TABS: ORAL_TABLET | ORAL | Status: DC

## 2013-06-18 NOTE — Telephone Encounter (Signed)
Medication order corrected and sent to pharmacy.

## 2013-06-19 ENCOUNTER — Emergency Department: Payer: Self-pay | Admitting: Emergency Medicine

## 2013-06-19 LAB — CBC
MCH: 29.7 pg (ref 26.0–34.0)
MCV: 87 fL (ref 80–100)
Platelet: 195 10*3/uL (ref 150–440)
WBC: 16.7 10*3/uL — ABNORMAL HIGH (ref 3.8–10.6)

## 2013-06-19 LAB — BASIC METABOLIC PANEL
Calcium, Total: 9 mg/dL (ref 8.5–10.1)
Creatinine: 0.9 mg/dL (ref 0.60–1.30)
EGFR (African American): >60
EGFR (Non-African Amer.): >60
Glucose: 133 mg/dL — ABNORMAL HIGH (ref 65–99)
Osmolality: 279 (ref 275–301)

## 2013-06-19 LAB — CK TOTAL AND CKMB (NOT AT ARMC)
CK, Total: 196 U/L (ref 35–232)
CK, Total: 165 U/L (ref 35–232)
CK-MB: 1.1 ng/mL (ref 0.5–3.6)
CK-MB: 0.9 ng/mL (ref 0.5–3.6)

## 2013-06-19 LAB — TROPONIN I: Troponin-I: <0.02 ng/mL

## 2013-06-20 ENCOUNTER — Telehealth: Payer: Self-pay | Admitting: *Deleted

## 2013-06-20 NOTE — Telephone Encounter (Signed)
Got a cortisone shot with Dr [name] in his hip on 06/17/13 and he is in a lot of pain, more sciatic in nature.  He went to the hospital and they told him it may be just due to nerve damage. This hospital visit is not in epic so there is no record of what was done or what was given.

## 2013-06-27 ENCOUNTER — Emergency Department: Payer: Self-pay | Admitting: Emergency Medicine

## 2013-06-27 LAB — CSF CELL COUNT WITH DIFFERENTIAL
CSF Tube #: 3
Eosinophil: 0 %
Lymphocytes: 64 %
Other Cells: 0 %
RBC (CSF): 86 /mm3
WBC (CSF): 3 /mm3

## 2013-06-27 LAB — CSF CELL CT + PROT + GLU PANEL
CSF Tube #: 1
Glucose, CSF: 76 mg/dL — ABNORMAL HIGH (ref 40–75)
Monocytes/Macrophages: 30 %
Protein, CSF: 66 mg/dL — ABNORMAL HIGH (ref 15–45)
WBC (CSF): 5 /mm3

## 2013-07-03 ENCOUNTER — Emergency Department: Payer: Self-pay | Admitting: Emergency Medicine

## 2013-07-03 LAB — URINALYSIS, COMPLETE
Blood: NEGATIVE
Ketone: NEGATIVE
Nitrite: NEGATIVE
Ph: 5 (ref 4.5–8.0)
Specific Gravity: 1.005 (ref 1.003–1.030)

## 2013-07-22 ENCOUNTER — Other Ambulatory Visit: Payer: Self-pay

## 2013-07-22 MED ORDER — ACETAMINOPHEN-CODEINE #3 300-30 MG PO TABS: ORAL_TABLET | ORAL | Status: DC

## 2013-07-22 NOTE — Telephone Encounter (Signed)
Refill of acetaminophen/codeine #3 sent to [facility]

## 2013-08-07 ENCOUNTER — Telehealth: Payer: Self-pay | Admitting: General Practice

## 2013-08-07 NOTE — Telephone Encounter (Signed)
Pt called wanting to make an appointment with you. Pt stated he saw you at [facility] Pt stated he pcp is in [place] and he would like to have a local md I offered him an new patient  Appointment with Raquel Pt stated he is on worker comp. Pt stated he files on his insurance then his lawyer takes care of the bills if worker comp problem Pt states he has bcbs

## 2013-08-08 NOTE — Telephone Encounter (Signed)
Fine

## 2013-08-20 ENCOUNTER — Telehealth: Payer: Self-pay

## 2013-08-20 MED ORDER — ACETAMINOPHEN-CODEINE #3 300-30 MG PO TABS: ORAL_TABLET | ORAL | Status: DC

## 2013-08-20 NOTE — Telephone Encounter (Signed)
Patient called to request tylenol #3 refill and to find out when his next appointment it.

## 2013-08-20 NOTE — Telephone Encounter (Signed)
Tylenol #3 Refill called in to [facility].

## 2013-08-22 NOTE — Telephone Encounter (Signed)
Left message for pt to call office please let him know about his appointment  09/17/13 @ 1:30  Confirm address and mail npp

## 2013-08-25 NOTE — Telephone Encounter (Signed)
Pt wanted nov appointment Made appointment for 09/29/13  Pt aware Mailed npp

## 2013-08-29 ENCOUNTER — Emergency Department: Payer: Self-pay | Admitting: Emergency Medicine

## 2013-09-16 ENCOUNTER — Encounter: Payer: Self-pay | Admitting: Physical Medicine and Rehabilitation

## 2013-09-16 ENCOUNTER — Encounter
Payer: Worker's Compensation | Attending: Physical Medicine and Rehabilitation | Admitting: Physical Medicine and Rehabilitation

## 2013-09-16 ENCOUNTER — Encounter: Payer: Self-pay | Admitting: Physical Medicine & Rehabilitation

## 2013-09-16 VITALS — BP 111/70 | HR 87 | Resp 14 | Ht 69.0 in | Wt 246.2 lb

## 2013-09-16 DIAGNOSIS — M76899 Other specified enthesopathies of unspecified lower limb, excluding foot: Secondary | ICD-10-CM

## 2013-09-16 DIAGNOSIS — M7061 Trochanteric bursitis, right hip: Secondary | ICD-10-CM

## 2013-09-16 DIAGNOSIS — M545 Low back pain, unspecified: Secondary | ICD-10-CM | POA: Insufficient documentation

## 2013-09-16 DIAGNOSIS — M79609 Pain in unspecified limb: Secondary | ICD-10-CM | POA: Insufficient documentation

## 2013-09-16 DIAGNOSIS — M47817 Spondylosis without myelopathy or radiculopathy, lumbosacral region: Secondary | ICD-10-CM

## 2013-09-16 MED ORDER — TRAMADOL HCL 50 MG PO TABS: 100.0000 mg | ORAL_TABLET | Freq: Three times a day (TID) | ORAL | Status: DC | PRN

## 2013-09-16 MED ORDER — DICLOFENAC SODIUM 1 % TD GEL: 2.0000 g | Freq: Four times a day (QID) | TRANSDERMAL | Status: DC

## 2013-09-16 MED ORDER — ACETAMINOPHEN-CODEINE #3 300-30 MG PO TABS: ORAL_TABLET | ORAL | Status: DC

## 2013-09-16 NOTE — Progress Notes (Signed)
Subjective:    Patient ID: Joel Page, male    DOB: 18-Aug-1971, 42 y.o.   MRN: [id number]  HPI The patient is a 42 year old male, With a Hx of LBP . The patient complains about moderate to severe bilateral LBP pain , which radiates into his LE in the L5 distribution bilateral. Patient also complains about numbness and tingling in the same distribution . He describes the pain as sharp, with muscle spasms . Applying heat, taking medications , changing positions alleviate the symptoms. Prolonged standing , sitting aggrevates the symptoms. The patient grades his pain as a 7-8 /10 on average. The patient complains about right lateral hip pain, last trochanteric bursa injection was 3 month ago.  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 He initially was treated in the [facility], but then followed up at the [facility]. At [facility], he saw Dr. Ruthann Cancer. He was treated  with physical therapy as well as medications. He had an MRI at [facility],  Mar 21, 2011, degenerative disk disease L4-5 and L5-S1, mild neural  foraminal narrowing at L5-S1. This was referred to Dr. Golda Acre note  from Apr 03, 2011. He received tramadol as well as Flexeril for pain.  His relief from Flexeril was not particularly good, tramadol was  helpful, although, when he kept on it for a long period of time, it  became less effective. He was also seen by Dr. Merri Ray from  [facility]. He was recommended to go back to  light duty work on March 30. He had an MRI at [facility], October 19, 2010 showing small central L4-5 disk protrusion, mild central canal  narrowing, shallow rightward eccentric L5-S1 disk bulge without canal or  foraminal stenosis.   Pain Inventory Average Pain 9 Pain Right Now 8 My pain is sharp, burning, stabbing and  aching  In the last 24 hours, has pain interfered with the following? General activity 9 Relation with others 9 Enjoyment of life 9 What TIME of day is your pain at its worst? all day Sleep (in general) Poor  Pain is worse with: walking, bending, sitting, inactivity, standing and some activites Pain improves with: rest, heat/ice, pacing activities and medication Relief from Meds: 2-5  Mobility walk without assistance walk with assistance use a cane how many minutes can you walk? 10 ability to climb steps?  yes do you drive?  yes Do you have any goals in this area?  yes  Function employed # of hrs/week na I need assistance with the following:  dressing and household duties  Neuro/Psych bladder control problems bowel control problems weakness numbness tremor tingling trouble walking spasms dizziness anxiety  Prior Studies Any changes since last visit?  no  Physicians involved in your care Any changes since last visit?  no   Family History  Problem Relation Age of Onset  . Cancer Mother   . Dementia Father   . COPD Father    History   Social History  . Marital Status: Legally Separated    Spouse Name: N/A    Number of Children: N/A  . Years of Education: N/A   Social History Main Topics  . Smoking status: Current Every Day Smoker -- 1.00 packs/day    Types: Cigarettes  . Smokeless tobacco: Former Neurosurgeon  .  Alcohol Use: Yes  . Drug Use: None  . Sexual Activity: None   Other Topics Concern  . None   Social History Narrative  . None   Past Surgical History  Procedure Laterality Date  . Sphincterotomy     Past Medical History  Diagnosis Date  . Diabetes mellitus   . DDD (degenerative disc disease)   . Allergy   . Retinopathy due to secondary diabetes 06/24/12    both eyes   BP 111/70  Pulse 87  Resp 14  Ht 5\' 9"  (1.753 m)  Wt 246 lb 3.2 oz (111.676 kg)  BMI 36.34 kg/m2  SpO2 95%      Review of Systems  Constitutional: Positive for  diaphoresis and unexpected weight change.  Respiratory: Positive for cough, shortness of breath and wheezing.   Gastrointestinal: Positive for nausea, vomiting, abdominal pain and diarrhea.  Genitourinary: Positive for dysuria.       Bowel and bladder control problems  Musculoskeletal: Positive for gait problem.  Neurological: Positive for dizziness, tremors, weakness and numbness.       Tingling, spasms  Psychiatric/Behavioral: The patient is nervous/anxious.   All other systems reviewed and are negative.       Objective:   Physical Exam Constitutional: He is oriented to person, place, and time. He appears well-developed and well-nourished.  obese  HENT:  Head: Normocephalic.  Neck: Normal range of motion.  Musculoskeletal: Normal range of motion. He exhibits tenderness.  Neurological: He is alert and oriented to person, place, and time.  Skin: Skin is warm and dry.  Psychiatric: He has a normal mood and affect.  Symmetric normal motor tone is noted throughout. Normal muscle bulk. Muscle testing reveals 5/5 muscle strength of the upper extremity, and 5/5 of the lower extremity. Full range of motion in upper and lower extremities. ROM of spine is restricted into flexion and extension. Fine motor movements are normal in both hands.  Sensory is intact and symmetric to light touch, pinprick and proprioception.  DTR in the upper and lower extremity are present and symmetric 2+. No clonus is noted.  Patient arises from chair without difficulty. Narrow based gait with normal arm swing bilateral , able to walk on heels and toes .         Assessment & Plan:  This is a 42 year old male with  1.LBP, radiating into lateral LE bilateral ( L5 distribution)  2. Moderate right eccentric central stenosis at L4-5, after disc protrusion  3. Right trochanteric bursitis, steroid injection given, after patient signed consent, tolerated injection well, post injection instructions given. Prescribed  Voltaren gel, if Sx flare up again, to use inbetween possible injections. Plan : Continue medications,he is taking tylenol#3 bid , tramadol 100mg  tid,  advised patient to continue with a walking program.  Advised patient to only take half of the tablet of trazodone, because if he takes a whole tablet 50mg  he reports that he is too drowsy in the morning. Follow up in 3 month

## 2013-09-16 NOTE — Patient Instructions (Signed)
Try to stay as active as tolerated 

## 2013-09-17 ENCOUNTER — Ambulatory Visit: Payer: Self-pay | Admitting: Internal Medicine

## 2013-09-29 ENCOUNTER — Ambulatory Visit: Payer: Self-pay | Admitting: Internal Medicine

## 2013-09-29 ENCOUNTER — Telehealth: Payer: Self-pay | Admitting: Internal Medicine

## 2013-09-29 NOTE — Telephone Encounter (Signed)
Patient called in states he has had a cold the past few days and took nyquil and slept through his appointment. He said that he is sorry that he missed the appointment. I advised patient that per dr. Darrick Huntsman she didn't want [place] to reschedule him but I would send a message back and ask if she would see him. He states the reason he didn't come to the other appointment is he didn't have co-pay and he rescheduled. Please advise.

## 2013-09-29 NOTE — Telephone Encounter (Signed)
No,. Not interested in giving him a 3rd chance.

## 2013-10-01 ENCOUNTER — Other Ambulatory Visit: Payer: Self-pay

## 2013-10-01 MED ORDER — TIZANIDINE HCL 4 MG PO TABS: ORAL_TABLET | ORAL | Status: DC

## 2013-10-01 NOTE — Telephone Encounter (Signed)
Tizanidine Refill sent to [facility].

## 2013-11-24 ENCOUNTER — Telehealth: Payer: Self-pay

## 2013-11-24 NOTE — Telephone Encounter (Signed)
Per [facility] report patient has multiple prescribers.

## 2013-11-27 ENCOUNTER — Telehealth: Payer: Self-pay

## 2013-11-27 ENCOUNTER — Other Ambulatory Visit: Payer: Self-pay | Admitting: Physical Medicine & Rehabilitation

## 2013-11-27 NOTE — Telephone Encounter (Signed)
Patient called upset about being discharged.  Tried to call patient but he was not available, left message for patient to call back.

## 2013-11-28 ENCOUNTER — Telehealth: Payer: Self-pay

## 2013-11-28 NOTE — Telephone Encounter (Signed)
Left patient a voicemail to return call to clinic. 

## 2013-11-28 NOTE — Telephone Encounter (Signed)
Patient was returning a call. He would like a explanation as to why he was discharged. If someone could return his call.

## 2013-12-01 ENCOUNTER — Telehealth: Payer: Self-pay | Admitting: Physical Medicine & Rehabilitation

## 2013-12-01 NOTE — Telephone Encounter (Signed)
Patient came to window requesting a refill on his medication.  States in his letter that we would prescribe his last months medication.  Please call patient.

## 2013-12-02 NOTE — Telephone Encounter (Signed)
Left a voicemail stating we could not refill his medication because of the reason( did not state the reason on the voicemail)  he was discharged from the clinic, and if he had any further questions to call the clinic back.

## 2013-12-02 NOTE — Telephone Encounter (Signed)
We do not provide any additional 30 day prescription in the case of multiple prescribers

## 2013-12-03 ENCOUNTER — Telehealth: Payer: Self-pay

## 2013-12-03 NOTE — Telephone Encounter (Signed)
Left patient a voicemail to inform him that this was the last reach out that we would be doing. We have tried to reach the patient several times to explain to him why Dr. Letta Pate would not continue his care for 30 days after being discharged. Left on the voicemail that the he could go to his PCP for medication if need be.  Per Dr. Letta Pate he will not continue patient's care because he had multiple prescribers.

## 2013-12-05 ENCOUNTER — Ambulatory Visit: Payer: Self-pay | Admitting: Internal Medicine

## 2013-12-11 DIAGNOSIS — F17200 Nicotine dependence, unspecified, uncomplicated: Secondary | ICD-10-CM | POA: Insufficient documentation

## 2013-12-16 ENCOUNTER — Other Ambulatory Visit: Payer: Self-pay | Admitting: Physical Medicine & Rehabilitation

## 2013-12-16 ENCOUNTER — Emergency Department: Payer: Self-pay | Admitting: Emergency Medicine

## 2013-12-16 ENCOUNTER — Ambulatory Visit: Payer: Self-pay | Admitting: Physical Medicine and Rehabilitation

## 2013-12-31 ENCOUNTER — Emergency Department: Payer: Self-pay | Admitting: Internal Medicine

## 2013-12-31 LAB — CBC
HCT: 43.6 % (ref 40.0–52.0)
HGB: 14.8 g/dL (ref 13.0–18.0)
MCH: 30 pg (ref 26.0–34.0)
MCHC: 34.1 g/dL (ref 32.0–36.0)
MCV: 88 fL (ref 80–100)
Platelet: 168 10*3/uL (ref 150–440)
RBC: 4.95 10*6/uL (ref 4.40–5.90)
RDW: 14.6 % — ABNORMAL HIGH (ref 11.5–14.5)
WBC: 11.7 10*3/uL — ABNORMAL HIGH (ref 3.8–10.6)

## 2013-12-31 LAB — URINALYSIS, COMPLETE
BILIRUBIN, UR: NEGATIVE
Bacteria: NONE SEEN
Blood: NEGATIVE
Glucose,UR: NEGATIVE mg/dL (ref 0–75)
KETONE: NEGATIVE
LEUKOCYTE ESTERASE: NEGATIVE
Nitrite: NEGATIVE
PH: 6 (ref 4.5–8.0)
Protein: NEGATIVE
RBC,UR: NONE SEEN /HPF (ref 0–5)
Specific Gravity: 1.006 (ref 1.003–1.030)
WBC UR: 1 /HPF (ref 0–5)

## 2013-12-31 LAB — CK: CK, Total: 184 U/L

## 2013-12-31 LAB — COMPREHENSIVE METABOLIC PANEL
ALBUMIN: 4 g/dL (ref 3.4–5.0)
ALK PHOS: 74 U/L
Anion Gap: 5 — ABNORMAL LOW (ref 7–16)
BUN: 14 mg/dL (ref 7–18)
Bilirubin,Total: 0.4 mg/dL (ref 0.2–1.0)
CALCIUM: 9.3 mg/dL (ref 8.5–10.1)
Chloride: 106 mmol/L (ref 98–107)
Co2: 27 mmol/L (ref 21–32)
Creatinine: 0.94 mg/dL (ref 0.60–1.30)
EGFR (African American): >60
EGFR (Non-African Amer.): >60
Glucose: 102 mg/dL — ABNORMAL HIGH (ref 65–99)
OSMOLALITY: 276 (ref 275–301)
Potassium: 4.2 mmol/L (ref 3.5–5.1)
SGOT(AST): 14 U/L — ABNORMAL LOW (ref 15–37)
SGPT (ALT): 30 U/L (ref 12–78)
Sodium: 138 mmol/L (ref 136–145)
Total Protein: 7.5 g/dL (ref 6.4–8.2)

## 2013-12-31 LAB — SEDIMENTATION RATE: ERYTHROCYTE SED RATE: 13 mm/h (ref 0–15)

## 2013-12-31 NOTE — Telephone Encounter (Signed)
Noted  

## 2014-01-21 ENCOUNTER — Emergency Department: Payer: Self-pay | Admitting: Emergency Medicine

## 2014-03-19 ENCOUNTER — Ambulatory Visit: Payer: Self-pay | Admitting: Family Medicine

## 2014-05-13 ENCOUNTER — Ambulatory Visit: Payer: Self-pay | Admitting: Family Medicine

## 2014-08-13 ENCOUNTER — Other Ambulatory Visit: Payer: Self-pay | Admitting: Physical Medicine & Rehabilitation

## 2014-09-22 NOTE — Progress Notes (Signed)
   Subjective:    Patient ID: Joel Page, male    DOB: 1971/09/27, 43 y.o.   MRN: [id number]  HPI    Review of Systems     Objective:   Physical Exam        Assessment & Plan:  EMG performed 09/22/2014.  See EMG report under media tab.

## 2014-12-09 LAB — CBC AND DIFFERENTIAL
HEMATOCRIT: 44 % (ref 41–53)
HEMOGLOBIN: 14.7 g/dL (ref 13.5–17.5)
NEUTROS ABS: 8 /uL
Platelets: 223 10*3/uL (ref 150–399)
WBC: 11.1 10^3/mL

## 2014-12-09 LAB — BASIC METABOLIC PANEL
BUN: 7 mg/dL (ref 4–21)
CREATININE: 0.9 mg/dL (ref 0.6–1.3)
Glucose: 125 mg/dL
POTASSIUM: 4.6 mmol/L (ref 3.4–5.3)
SODIUM: 140 mmol/L (ref 137–147)

## 2014-12-09 LAB — HEPATIC FUNCTION PANEL
ALT: 20 U/L (ref 10–40)
AST: 18 U/L (ref 14–40)
Alkaline Phosphatase: 80 U/L (ref 25–125)
Bilirubin, Total: 0.3 mg/dL

## 2015-02-22 ENCOUNTER — Ambulatory Visit
Admit: 2015-02-22 | Disposition: A | Discharge status: Home / Self Care | Payer: Self-pay | Attending: Family Medicine | Admitting: Family Medicine

## 2015-03-14 NOTE — Op Note (Signed)
PATIENT NAME:  Joel Page, MAGES MR#:  [id number] DATE OF BIRTH:  Oct 10, 1971  DATE OF PROCEDURE:  02/05/2012  PREOPERATIVE DIAGNOSIS: Internal hemorrhoids with anal fissure.   POSTOPERATIVE DIAGNOSIS: Internal hemorrhoids with anal fissure.   PROCEDURES PERFORMED:  1. Rectal examination under anesthesia.  2. PPH stapled hemorrhoidpexy.  3. Closed lateral anal sphincterotomy.   SURGEON: Rodena Goldmann III, MD  ANESTHESIA: General.   DESCRIPTION OF PROCEDURE: With the patient in the supine position after induction of appropriate general anesthesia, the patient placed in lithotomy position, appropriately padded and positioned. His perineal area was prepped with Betadine, draped with sterile towels. 0.2% Marcaine was injected around the sphincter to help with relaxation. The rectum was quite tight barely admitting the operator's finger. There did appear to be a posterior midline fissure. There were multiple internal hemorrhoids identified. Decision was made to perform a [place] stapled hemorrhoidpexy. The obturator was introduced without difficulty and a pursestring of 2-0 Prolene placed 2 cm above the most superior hemorrhoid. Pursestring was placed without difficulty. Pursestring appeared to feel tight around the finger. The Courtland device was then inserted into the rectum through the pursestring. The pursestring was secured around the anvil. The suture limbs were then passed through the stapling device and again approximated for tension. The stapling device was closed under tension without difficulty. Two minutes elapsed at which time the stapler was then fired and another 90 seconds observed before removing the stapler. Bleeding appear to be well controlled. The staple line appeared to be intact. The specimen was intact without evidence of a break in the staple line. The area was examined. No significant bleeding was encountered. Using a bivalve retractor a lateral sphincterotomy was performed at 3:00 in the rectum  inserting an 11 blade perpendicular and turning and laterally to divide the sphincter muscle. The remainder of the fibers were broken up with the operator's finger. Rectum was packed with Gelfoam and Avitene. Dry dressing was applied. The patient returned to the recovery room having tolerated procedure well.   Sponge, instrument and needle counts were correct x2 in the Operating Room.   ____________________________ Rodena Goldmann III, MD rle:cms D: 02/05/2012 10:01:25 ET T: 02/05/2012 10:40:25 ET JOB#: [id number]  cc: Rodena Goldmann III, MD, <Dictator> Rodena Goldmann MD ELECTRONICALLY SIGNED 02/06/2012 10:28

## 2015-03-22 LAB — HEMOGLOBIN A1C: Hgb A1c MFr Bld: 6.5 % — AB (ref 4.0–6.0)

## 2015-05-06 ENCOUNTER — Telehealth: Payer: Self-pay | Admitting: Family Medicine

## 2015-05-11 ENCOUNTER — Ambulatory Visit: Payer: Self-pay | Admitting: Family Medicine

## 2015-05-11 ENCOUNTER — Ambulatory Visit (INDEPENDENT_AMBULATORY_CARE_PROVIDER_SITE_OTHER): Payer: BC Managed Care – PPO | Admitting: Family Medicine

## 2015-05-11 ENCOUNTER — Encounter: Payer: Self-pay | Admitting: Family Medicine

## 2015-05-11 VITALS — BP 142/90 | HR 111 | Temp 97.8°F | Resp 20 | Ht 68.0 in | Wt 244.9 lb

## 2015-05-11 DIAGNOSIS — M25559 Pain in unspecified hip: Secondary | ICD-10-CM | POA: Insufficient documentation

## 2015-05-11 DIAGNOSIS — E669 Obesity, unspecified: Secondary | ICD-10-CM | POA: Insufficient documentation

## 2015-05-11 DIAGNOSIS — F17200 Nicotine dependence, unspecified, uncomplicated: Secondary | ICD-10-CM | POA: Insufficient documentation

## 2015-05-11 DIAGNOSIS — G2581 Restless legs syndrome: Secondary | ICD-10-CM | POA: Insufficient documentation

## 2015-05-11 DIAGNOSIS — IMO0002 Reserved for concepts with insufficient information to code with codable children: Secondary | ICD-10-CM | POA: Insufficient documentation

## 2015-05-11 DIAGNOSIS — R030 Elevated blood-pressure reading, without diagnosis of hypertension: Secondary | ICD-10-CM | POA: Diagnosis not present

## 2015-05-11 DIAGNOSIS — M5010 Cervical disc disorder with radiculopathy, unspecified cervical region: Secondary | ICD-10-CM | POA: Diagnosis not present

## 2015-05-11 DIAGNOSIS — R1011 Right upper quadrant pain: Secondary | ICD-10-CM | POA: Insufficient documentation

## 2015-05-11 DIAGNOSIS — R1031 Right lower quadrant pain: Secondary | ICD-10-CM | POA: Insufficient documentation

## 2015-05-11 DIAGNOSIS — G4700 Insomnia, unspecified: Secondary | ICD-10-CM | POA: Insufficient documentation

## 2015-05-11 DIAGNOSIS — M4726 Other spondylosis with radiculopathy, lumbar region: Secondary | ICD-10-CM

## 2015-05-11 DIAGNOSIS — K648 Other hemorrhoids: Secondary | ICD-10-CM | POA: Insufficient documentation

## 2015-05-11 DIAGNOSIS — R062 Wheezing: Secondary | ICD-10-CM | POA: Insufficient documentation

## 2015-05-11 DIAGNOSIS — M47816 Spondylosis without myelopathy or radiculopathy, lumbar region: Secondary | ICD-10-CM | POA: Insufficient documentation

## 2015-05-11 DIAGNOSIS — N451 Epididymitis: Secondary | ICD-10-CM | POA: Insufficient documentation

## 2015-05-11 DIAGNOSIS — E1165 Type 2 diabetes mellitus with hyperglycemia: Secondary | ICD-10-CM | POA: Insufficient documentation

## 2015-05-11 MED ORDER — HYDROCODONE-ACETAMINOPHEN 5-300 MG PO TABS: 1.0000 | ORAL_TABLET | Freq: Two times a day (BID) | ORAL | Status: DC | PRN

## 2015-05-11 MED ORDER — TRAMADOL HCL 50 MG PO TABS: 50.0000 mg | ORAL_TABLET | Freq: Four times a day (QID) | ORAL | Status: DC | PRN

## 2015-05-11 NOTE — Progress Notes (Signed)
Name: Joel Page   MRN: [id number]    DOB: 1970-12-19   Date:05/11/2015       Progress Note  Subjective  Chief Complaint  Chief Complaint  Patient presents with  . Follow-up    Patient states he has tightness in shoulders and neck along with occasional headache.    HPI  Joint/Muscle Pain: Patient complains of arthralgias for which has been present for several years. Pain is located in neck, shoulders, hips, lower back, thoracic back., is described as sharp, shooting and throbbing, and is constant .  Associated symptoms include: decreased range of motion and instability.  The patient has Using tramadol and less often vicodin as needed. Has been released from work due to ongoing limitations due to his medical issues per patient he has hired an Forensic psychologist to handle the issue. He feels that the several stressors in his life has elevated his blood pressure today which is unusual for him. Denies chest pain, palpitations, dizziness, SOB.    Patient Active Problem List   Diagnosis Date Noted  . 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  . Abdominal pain, right upper quadrant 05/11/2015  . Abdominal pain, right lower quadrant 05/11/2015  . Diabetes mellitus, type 2 05/11/2015  . Current smoker 05/11/2015  . Epididymitis 05/11/2015  . Hemorrhoids, internal 05/11/2015  . Arthralgia of hip 05/11/2015  . Cannot sleep 05/11/2015  . Adiposity 05/11/2015  . 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  . Restless leg 05/11/2015  . Wheezing symptom 05/11/2015  . Compulsive tobacco user syndrome 12/11/2013  . Trochanteric bursitis of right hip 06/17/2013  . Sciatica 03/19/2012  . Lumbar degenerative disc disease 03/19/2012    History  Substance Use Topics  . Smoking status: Current Every Day Smoker -- 1.00 packs/day    Types: Cigarettes  . Smokeless tobacco: Former Systems developer  . Alcohol Use: Yes     Current outpatient prescriptions:  .  acetaminophen-codeine  (TYLENOL #3) 300-30 MG per tablet, TAKE 1 TABLET BY MOUTH EVERY 6 HOURS AS NEEDED FOR PAIN ONLY DURING FLARE UPS ( MUST LAST 1 MONTH ), Disp: 60 tablet, Rfl: 2 .  albuterol (PROVENTIL HFA) 108 (90 BASE) MCG/ACT inhaler, Inhale 2 puffs into the lungs every 6 (six) hours as needed., Disp: , Rfl:  .  buPROPion (WELLBUTRIN SR) 150 MG 12 hr tablet, Take 1 tablet by mouth 2 (two) times daily., Disp: , Rfl:  .  clonazePAM (KLONOPIN) 1 MG tablet, Take 1 tablet by mouth daily., Disp: , Rfl:  .  cyclobenzaprine (FLEXERIL) 5 MG tablet, Take 1 tablet by mouth every 6 (six) hours as needed., Disp: , Rfl:  .  diclofenac sodium (VOLTAREN) 1 % GEL, Apply 2 g topically 4 (four) times daily., Disp: 2 Tube, Rfl: 2 .  diphenhydrAMINE (BENADRYL ALLERGY) 25 mg capsule, Take 1 tablet by mouth as needed., Disp: , Rfl:  .  gabapentin (NEURONTIN) 100 MG capsule, Take 1 capsule by mouth 3 (three) times daily., Disp: , Rfl:  .  glucose blood (ACCU-CHEK ACTIVE STRIPS) test strip, 1 strip by Does not apply route 2 (two) times daily., Disp: , Rfl:  .  Hydrocodone-Acetaminophen (VICODIN) 5-300 MG TABS, Take 1 tablet by mouth 2 (two) times daily as needed., Disp: , Rfl:  .  metFORMIN (GLUCOPHAGE) 500 MG tablet, Take 1 tablet by mouth 3 (three) times daily., Disp: , Rfl:  .  Naproxen Sodium (ALEVE) 220 MG CAPS, Take 1 tablet by mouth as needed., Disp: , Rfl:  .  tiZANidine (ZANAFLEX) 4 MG tablet, TAKE 1/2-1 TABLET BY MOUTH THREE TIMES DAILY., Disp: 90 tablet, Rfl: 0 .  traMADol (ULTRAM) 50 MG tablet, Take 2 tablets (100 mg total) by mouth every 8 (eight) hours as needed for pain., Disp: 180 tablet, Rfl: 3 .  traZODone (DESYREL) 50 MG tablet, Take 1 tablet (50 mg total) by mouth at bedtime., Disp: 30 tablet, Rfl: 4  Past Surgical History  Procedure Laterality Date  . Sphincterotomy    . Excisional hemorrhoidectomy  2012  . Orif femur fracture  1989    Family History  Problem Relation Age of Onset  . Lung cancer Mother   .  Dementia Father   . COPD Father     Allergies  Allergen Reactions  . Neurontin [Gabapentin] Other (See Comments)    CHEST PAINS  . Trazodone Other (See Comments)    Makes tingling & numbness in hands & arms worse; causes an erection.  . Baclofen Itching  . Diclofenac Sodium Other (See Comments)    Raised blood sugar      Review of Systems  Ten systems reviewed and is negative except as mentioned in HPI.   Objective  BP 142/90 mmHg  Pulse 111  Temp(Src) 97.8 F (36.6 C)  Resp 20  Ht 5\' 8"  (1.727 m)  Wt 244 lb 14.4 oz (111.086 kg)  BMI 37.25 kg/m2  SpO2 97%  Physical Exam  Constitutional: Patient appears well-developed and well-nourished. In no distress.  HEENT:  - Head: Normocephalic and atraumatic.  - Ears: Bilateral TMs gray, no erythema or effusion - Nose: Nasal mucosa moist - Mouth/Throat: Oropharynx is clear and moist. No tonsillar hypertrophy or erythema. No post nasal drainage.  - Eyes: Conjunctivae clear, EOM movements normal. PERRLA. No scleral icterus.  Neck: Normal range of motion. Neck supple. No JVD present. No thyromegaly present.  Cardiovascular: Normal rate, regular rhythm and normal heart sounds.  No murmur heard.  Pulmonary/Chest: Effort normal and breath sounds normal. No respiratory distress. Musculoskeletal: Decreased ROM in bilateral UE especially with abduction, Lumbar spine with paraspinal muscle tension, no palpable step off. Cervical spine with positive Spurling Sign. Peripheral vascular: Bilateral LE no edema. Neurological: CN II-XII grossly intact with no focal deficits. Alert and oriented to person, place, and time. Coordination, balance, strength, speech and gait are normal.  Skin: Skin is warm and dry. No rash noted. No erythema.  Psychiatric: Patient has stable mood although more down and stressed that usual. Behavior is normal in office today. Judgment and thought content normal in office today.   Recent Results (from the past 2160  hour(s))  Hemoglobin A1c     Status: Abnormal   Collection Time: 03/22/15 12:00 AM  Result Value Ref Range   Hgb A1c MFr Bld 6.5 (A) 4.0 - 6.0 %     Assessment & Plan  1. Cervical disc disorder with radiculopathy Refilled pain medication.  - Hydrocodone-Acetaminophen (VICODIN) 5-300 MG TABS; Take 1 tablet by mouth 2 (two) times daily as needed.  Dispense: 60 each; Refill: 0 - Hydrocodone-Acetaminophen 5-300 MG TABS; Take 1 tablet by mouth 2 (two) times daily as needed.  Dispense: 60 each; Refill: 0 - Hydrocodone-Acetaminophen 5-300 MG TABS; Take 1 tablet by mouth 2 (two) times daily as needed.  Dispense: 60 each; Refill: 0 - traMADol (ULTRAM) 50 MG tablet; Take 1 tablet (50 mg total) by mouth every 6 (six) hours as needed.  Dispense: 120 tablet; Refill: 0 - traMADol (ULTRAM) 50 MG tablet; Take 1 tablet (  50 mg total) by mouth every 6 (six) hours as needed for moderate pain or severe pain.  Dispense: 120 tablet; Refill: 0 - traMADol (ULTRAM) 50 MG tablet; Take 1 tablet (50 mg total) by mouth every 6 (six) hours as needed for moderate pain or severe pain.  Dispense: 120 tablet; Refill: 0  2. Osteoarthritis of spine with radiculopathy, lumbar region Refilled for another 3 month supply. May be considering Lumbar spine fusion surgery as he is working with Neurospinal specialist.  The patient has been prescribed a controled substance today under the agreement of a filed pain treatment regimen contract.  With use of this medication they verbalize understanding the potential risk of addiction, abuse, and misuse, which can lead to overdose and death. The patient may not obtain and use other illicit or controled substances from any other sources while under the aformentioned contract. A urine drug screen will be performed periodically and the patient's name will be reviewed on the [place] Controlled Substance Reporting System regularly.  The patient expresses understanding the above statement and agreement to  comply.  The patient has been counseled on the proper use, side effects and potential interactions of the new medication with other prescribed and OTC medications. Under no circumstances is this (and any other) medication to be use with alcohol or other illicit drugs. This medication is not to be crushed, chewed, sniffed, injected or used in any other way other than what is stated in the directions. This medication is to be used at the frequency and quantity that is stated in the directions. This medication is to be used only by the individual who's name is on the prescription bottle. Drug sharing and selling is unacceptable. Patient voices understanding what has been said today.  - Hydrocodone-Acetaminophen (VICODIN) 5-300 MG TABS; Take 1 tablet by mouth 2 (two) times daily as needed.  Dispense: 60 each; Refill: 0 - Hydrocodone-Acetaminophen 5-300 MG TABS; Take 1 tablet by mouth 2 (two) times daily as needed.  Dispense: 60 each; Refill: 0 - Hydrocodone-Acetaminophen 5-300 MG TABS; Take 1 tablet by mouth 2 (two) times daily as needed.  Dispense: 60 each; Refill: 0 - traMADol (ULTRAM) 50 MG tablet; Take 1 tablet (50 mg total) by mouth every 6 (six) hours as needed.  Dispense: 120 tablet; Refill: 0 - traMADol (ULTRAM) 50 MG tablet; Take 1 tablet (50 mg total) by mouth every 6 (six) hours as needed for moderate pain or severe pain.  Dispense: 120 tablet; Refill: 0 - traMADol (ULTRAM) 50 MG tablet; Take 1 tablet (50 mg total) by mouth every 6 (six) hours as needed for moderate pain or severe pain.  Dispense: 120 tablet; Refill: 0  3. Elevated blood pressure reading without diagnosis of hypertension Monitor

## 2015-05-14 NOTE — Telephone Encounter (Signed)
errenous encounter °

## 2015-05-21 ENCOUNTER — Ambulatory Visit: Payer: Self-pay | Admitting: Family Medicine

## 2015-07-14 ENCOUNTER — Telehealth: Payer: Self-pay | Admitting: Family Medicine

## 2015-07-14 NOTE — Telephone Encounter (Signed)
Pt states he currently has no insurance and he is in need of a referral to [facility]. They will take him on as a workers comp patient. There phone # [phone] fax # [phone].

## 2015-07-14 NOTE — Telephone Encounter (Signed)
Please let Joel Page know that I can not refer him to any specialist through workman's comp, he will have to have his social worker or workman's physician refer him I believe.

## 2015-07-15 ENCOUNTER — Telehealth: Payer: Self-pay | Admitting: Family Medicine

## 2015-07-15 NOTE — Telephone Encounter (Signed)
If he is going to be a self pay patient with no insurance coverage he does not need a referral to see a pain management specialist. He needs to call the pain clinic he is interested in going to and ask what the process is to become a self pay patient at their office. He can sign a release of records from their office to get any medical records I have.

## 2015-07-15 NOTE — Telephone Encounter (Signed)
Patient informed. 

## 2015-07-15 NOTE — Telephone Encounter (Signed)
ERRENOUS °

## 2015-07-15 NOTE — Telephone Encounter (Signed)
Tried calling pt to inform about referral and the wireless customer is not available.

## 2015-07-15 NOTE — Telephone Encounter (Signed)
This is pertaining to previous message: patient is not requesting the referral to be sent through his workman comp. The office is just requesting that you send a regular referral with his records and that his workers comp is going to be handled somewhere else.

## 2015-09-16 ENCOUNTER — Other Ambulatory Visit: Payer: Self-pay | Admitting: Family Medicine

## 2015-09-16 DIAGNOSIS — M5010 Cervical disc disorder with radiculopathy, unspecified cervical region: Secondary | ICD-10-CM

## 2015-09-16 DIAGNOSIS — M4726 Other spondylosis with radiculopathy, lumbar region: Secondary | ICD-10-CM

## 2015-09-16 NOTE — Telephone Encounter (Signed)
Pt needs refill on Clonidine, hydrocodone and tramodol.

## 2015-09-16 NOTE — Telephone Encounter (Signed)
Contacted this patient to get clarity on the medications that he needed refilled. Patient confirmed that he needed the Vicodin & Tramadol, but stated he needed Klonopin not Clonidine.  Refill request was sent to Dr. Bobetta Lime for approval and submission.

## 2015-10-06 ENCOUNTER — Ambulatory Visit: Payer: BC Managed Care – PPO | Admitting: Family Medicine

## 2015-11-01 ENCOUNTER — Other Ambulatory Visit: Payer: Self-pay | Admitting: Family Medicine

## 2015-12-22 ENCOUNTER — Ambulatory Visit (INDEPENDENT_AMBULATORY_CARE_PROVIDER_SITE_OTHER): Payer: BLUE CROSS/BLUE SHIELD | Admitting: Family Medicine

## 2015-12-22 ENCOUNTER — Encounter: Payer: Self-pay | Admitting: Family Medicine

## 2015-12-22 ENCOUNTER — Telehealth: Payer: Self-pay | Admitting: Family Medicine

## 2015-12-22 VITALS — BP 122/60 | HR 96 | Temp 98.0°F | Resp 18 | Ht 68.0 in | Wt 258.7 lb

## 2015-12-22 DIAGNOSIS — G4700 Insomnia, unspecified: Secondary | ICD-10-CM | POA: Diagnosis not present

## 2015-12-22 DIAGNOSIS — IMO0001 Reserved for inherently not codable concepts without codable children: Secondary | ICD-10-CM

## 2015-12-22 DIAGNOSIS — M4726 Other spondylosis with radiculopathy, lumbar region: Secondary | ICD-10-CM | POA: Diagnosis not present

## 2015-12-22 DIAGNOSIS — E1165 Type 2 diabetes mellitus with hyperglycemia: Secondary | ICD-10-CM

## 2015-12-22 DIAGNOSIS — M5010 Cervical disc disorder with radiculopathy, unspecified cervical region: Secondary | ICD-10-CM

## 2015-12-22 DIAGNOSIS — J449 Chronic obstructive pulmonary disease, unspecified: Secondary | ICD-10-CM | POA: Diagnosis not present

## 2015-12-22 DIAGNOSIS — M47816 Spondylosis without myelopathy or radiculopathy, lumbar region: Secondary | ICD-10-CM | POA: Diagnosis not present

## 2015-12-22 LAB — POCT UA - MICROALBUMIN: MICROALBUMIN (UR) POC: NEGATIVE mg/L

## 2015-12-22 LAB — POCT GLYCOSYLATED HEMOGLOBIN (HGB A1C): HEMOGLOBIN A1C: 8.7

## 2015-12-22 MED ORDER — TRAMADOL HCL 50 MG PO TABS
50.0000 mg | ORAL_TABLET | Freq: Four times a day (QID) | ORAL | Status: DC | PRN
Start: 2015-12-22 — End: 2016-04-21

## 2015-12-22 MED ORDER — GLUCOSE BLOOD VI STRP: 100.0000 | ORAL_STRIP | Freq: Two times a day (BID) | Status: DC

## 2015-12-22 MED ORDER — ALBUTEROL SULFATE HFA 108 (90 BASE) MCG/ACT IN AERS: 1.0000 | INHALATION_SPRAY | Freq: Four times a day (QID) | RESPIRATORY_TRACT | Status: DC | PRN

## 2015-12-22 MED ORDER — METFORMIN HCL 500 MG PO TABS: 500.0000 mg | ORAL_TABLET | Freq: Three times a day (TID) | ORAL | Status: DC

## 2015-12-22 MED ORDER — TRAMADOL HCL 50 MG PO TABS: 50.0000 mg | ORAL_TABLET | Freq: Four times a day (QID) | ORAL | Status: DC | PRN

## 2015-12-22 MED ORDER — CYCLOBENZAPRINE HCL 10 MG PO TABS: 10.0000 mg | ORAL_TABLET | Freq: Three times a day (TID) | ORAL | Status: DC | PRN

## 2015-12-22 MED ORDER — HYDROCODONE-ACETAMINOPHEN 5-300 MG PO TABS
1.0000 | ORAL_TABLET | Freq: Three times a day (TID) | ORAL | Status: DC
Start: 2015-12-22 — End: 2016-03-30

## 2015-12-22 MED ORDER — CLONAZEPAM 1 MG PO TABS: 1.0000 mg | ORAL_TABLET | Freq: Every day | ORAL | Status: DC

## 2015-12-22 MED ORDER — GLUCOSE BLOOD VI STRP: 1.0000 | ORAL_STRIP | Freq: Two times a day (BID) | Status: DC

## 2015-12-22 MED ORDER — GABAPENTIN 100 MG PO CAPS: 100.0000 mg | ORAL_CAPSULE | Freq: Every day | ORAL | Status: DC

## 2015-12-22 MED ORDER — BAYER CONTOUR NEXT MONITOR W/DEVICE KIT: 1.0000 | PACK | Status: DC

## 2015-12-22 NOTE — Patient Instructions (Signed)
1) Try and be fasting for 8hrs at the next visit as your new doctor may want to get complete blood work on you.

## 2015-12-22 NOTE — Telephone Encounter (Signed)
Dan from [facility] stated that the patient insurance will no longer cover accu-check but will cover the contour next meter

## 2015-12-22 NOTE — Telephone Encounter (Signed)
completed

## 2015-12-22 NOTE — Progress Notes (Signed)
Name: Joel Page   MRN: [id number]    DOB: 07-15-1971   Date:12/22/2015       Progress Note  Subjective  Chief Complaint  Chief Complaint  Patient presents with  . Medication Refill  . Diabetes  . Depression  . Insomnia  . Pain    back pain    HPI  Joel Page is a 45 year old male here for routine follow up of chronic conditions. If you may recall he was without health insurance coverage for the past some months and was not able to f/u with me since 04/2015.   Patient complains of arthralgias for which has been present for several years. Pain is located in neck, shoulders, hips, lower back, thoracic back. Pain is described as sharp, shooting and throbbing, and is constant . Associated symptoms include: decreased range of motion and instability.Recently having more numbness and tingling in both hands. The patient was previously prescribed tramadol and less often vicodin as needed. Has started back on Gabapentin (left over medication) 100 mg at bedtime as the numbness in his hands are worse at night. The gabapentin is helping and not causing chest pain side effects if he uses it only at night time.  He was first diagnosed with Diabetes Type II several years ago and was previously doing well with glycemic control until loss of health insurance with loss of his job. On 03/22/15 his Hba1c was 6.5% but today his hba1c is 8.7%.  Today he reports recently not using his Metformin TID, having to make his last RX last longer by taking it one a day only or not at all. His diet is not consistent with a diabetic diet.  Related symptoms include paresthesia with fatigue and do not include polydipsia, polyuria, wounds, ulcers and gastroparesis. Current treatment includes Metformin 500 mg one a day. By report there is inconsistent compliance with treatment medications and he is still struggling with diet.  Last diabetic eye exam is unknown.  For his HLD he is not on statin therapy.   Otherwise he has  insomnia for which Klonopin 1 mg at bedtime was helping before he ran out a few months ago. He reports no side effects with this medication. Prior to this he has tried Trazodone which has caused unwanted erectile issues. He has gained 14 lbs since his last visit as he has been more sedentary. [place] is appropriately concerned about his weight.  Still smoking cigarettes but was able to quit end of 2016 only to re-start smoking again recently. Notes that he is SOB with activity more. Albuterol inhaler does help. Still interested in quitting smoking.   Past Medical History  Diagnosis Date  . Diabetes mellitus   . DDD (degenerative disc disease)   . Allergy   . Retinopathy due to secondary diabetes ([place]) 06/24/12    both eyes  . Lumbar stenosis   . Insomnia     Patient Active Problem List   Diagnosis Date Noted  . COPD suggested by initial evaluation ([place]) 12/22/2015  . 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  . Diabetes mellitus type 2, uncontrolled ([place]) 05/11/2015  . Current smoker 05/11/2015  . Epididymitis 05/11/2015  . Hemorrhoids, internal 05/11/2015  . Arthralgia of hip 05/11/2015  . Cannot sleep 05/11/2015  . Adiposity 05/11/2015  . 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  . Restless leg 05/11/2015  . Compulsive tobacco user syndrome 12/11/2013  . Trochanteric bursitis of right hip 06/17/2013  . Sciatica 03/19/2012  .  Lumbar degenerative disc disease 03/19/2012    Social History  Substance Use Topics  . Smoking status: Current Every Day Smoker -- 1.00 packs/day    Types: Cigarettes  . Smokeless tobacco: Former Systems developer  . Alcohol Use: Yes     Current outpatient prescriptions:  .  cyclobenzaprine (FLEXERIL) 10 MG tablet, Take 1 tablet (10 mg total) by mouth 3 (three) times daily as needed., Disp: 90 tablet, Rfl: 3 .  albuterol (PROVENTIL HFA) 108 (90 Base) MCG/ACT inhaler, Inhale 1-2 puffs into the lungs every 6 (six) hours as needed., Disp:  18 g, Rfl: 3 .  buPROPion (WELLBUTRIN SR) 150 MG 12 hr tablet, Take 1 tablet by mouth 2 (two) times daily., Disp: , Rfl:  .  clonazePAM (KLONOPIN) 1 MG tablet, Take 1 tablet (1 mg total) by mouth at bedtime., Disp: 90 tablet, Rfl: 1 .  diphenhydrAMINE (BENADRYL ALLERGY) 25 mg capsule, Take 1 tablet by mouth as needed., Disp: , Rfl:  .  gabapentin (NEURONTIN) 100 MG capsule, Take 1 capsule (100 mg total) by mouth at bedtime., Disp: 90 capsule, Rfl: 1 .  glucose blood (ACCU-CHEK ACTIVE STRIPS) test strip, 1 strip by Does not apply route 2 (two) times daily., Disp: , Rfl:  .  Hydrocodone-Acetaminophen (VICODIN) 5-300 MG TABS, Take 1 tablet by mouth every 8 (eight) hours., Disp: 90 each, Rfl: 0 .  Hydrocodone-Acetaminophen 5-300 MG TABS, Take 1 tablet by mouth 2 (two) times daily as needed., Disp: 60 each, Rfl: 0 .  Hydrocodone-Acetaminophen 5-300 MG TABS, Take 1 tablet by mouth 2 (two) times daily as needed., Disp: 60 each, Rfl: 0 .  metFORMIN (GLUCOPHAGE) 500 MG tablet, Take 1 tablet (500 mg total) by mouth 3 (three) times daily., Disp: 270 tablet, Rfl: 1 .  Naproxen Sodium (ALEVE) 220 MG CAPS, Take 1 tablet by mouth as needed., Disp: , Rfl:  .  traMADol (ULTRAM) 50 MG tablet, Take 1 tablet (50 mg total) by mouth every 6 (six) hours as needed., Disp: 120 tablet, Rfl: 0 .  traMADol (ULTRAM) 50 MG tablet, Take 1 tablet (50 mg total) by mouth every 6 (six) hours as needed for moderate pain or severe pain., Disp: 120 tablet, Rfl: 0 .  traMADol (ULTRAM) 50 MG tablet, Take 1 tablet (50 mg total) by mouth every 6 (six) hours as needed for moderate pain or severe pain., Disp: 120 tablet, Rfl: 0  Past Surgical History  Procedure Laterality Date  . Sphincterotomy    . Excisional hemorrhoidectomy  2012  . Orif femur fracture  1989  . Tooth extraction      Family History  Problem Relation Age of Onset  . Lung cancer Mother   . Dementia Father   . COPD Father     Allergies  Allergen Reactions  .  Trazodone Other (See Comments)    Makes tingling & numbness in hands & arms worse; causes an erection.  . Baclofen Itching  . Diclofenac Sodium Other (See Comments)    Raised blood sugar      Review of Systems  CONSTITUTIONAL: Yes weight gain and fatigue. No fever, chills, weakness.  HEENT:  - Eyes: No visual changes.  - Ears: No auditory changes. No pain.  - Nose: No sneezing, congestion, runny nose. - Throat: No sore throat. No changes in swallowing. SKIN: No rash or itching.  CARDIOVASCULAR: No chest pain, chest pressure or chest discomfort. No palpitations or edema.  RESPIRATORY: No shortness of breath, cough or sputum.  GASTROINTESTINAL: No anorexia,  nausea, vomiting. No changes in bowel habits. No abdominal pain or blood.  GENITOURINARY: No dysuria. No frequency. No discharge.  NEUROLOGICAL: No headache, dizziness, syncope, paralysis, ataxia. Yes numbness or tingling in the extremities. No memory changes. No change in bowel or bladder control.  MUSCULOSKELETAL: Chronic joint pain. No muscle pain. HEMATOLOGIC: No anemia, bleeding or bruising.  LYMPHATICS: No enlarged lymph nodes.  PSYCHIATRIC: No change in mood. No change in sleep pattern.  ENDOCRINOLOGIC: No reports of sweating, cold or heat intolerance. No polyuria or polydipsia.     Objective  BP 122/60 mmHg  Pulse 96  Temp(Src) 98 F (36.7 C) (Oral)  Resp 18  Ht 5\' 8"  (1.727 m)  Wt 258 lb 11.2 oz (117.346 kg)  BMI 39.34 kg/m2  SpO2 96% Body mass index is 39.34 kg/(m^2).  Physical Exam  Constitutional: Patient is obese and well-nourished. In no distress.  Neck: Normal range of motion. Neck supple. No JVD present. No thyromegaly present. Positive Spurling sign.  Cardiovascular: Normal rate, regular rhythm and normal heart sounds.  No murmur heard.  Pulmonary/Chest: Effort normal and breath sounds reduced tidal volume and end exp wheezing. Musculoskeletal: Decreased ROM in bilateral UE especially with abduction,  Lumbar spine with paraspinal muscle tension, no palpable step off. Cervical spine with positive Spurling Sign. Peripheral vascular: Bilateral LE no edema. Neurological: CN II-XII grossly intact with no focal deficits. Alert and oriented to person, place, and time. Coordination, balance, strength, speech and gait are normal.  Skin: Skin is warm and dry. No rash noted. No erythema.  Psychiatric: Patient has a stable mood and affect. Behavior is normal in office today. Judgment and thought content normal in office today.  Diabetic Foot Exam - Simple   Simple Foot Form  Diabetic Foot exam was performed with the following findings:  Yes 12/22/2015 12:00 PM  Visual Inspection  No deformities, no ulcerations, no other skin breakdown bilaterally:  Yes  Sensation Testing  See comments:  Yes  Pulse Check  Posterior Tibialis and Dorsalis pulse intact bilaterally:  Yes  Comments  Diminished sensation to monofilament distal aspect of bilateral feet plantar and dorsal surface.       Results for orders placed or performed in visit on 12/22/15 (from the past 24 hour(s))  POCT HgB A1C     Status: Abnormal   Collection Time: 12/22/15 11:15 AM  Result Value Ref Range   Hemoglobin A1C 8.7   POCT UA - Microalbumin     Status: Normal   Collection Time: 12/22/15 11:59 AM  Result Value Ref Range   Microalbumin Ur, POC neg mg/L   Creatinine, POC  mg/dL   Albumin/Creatinine Ratio, Urine, POC       Assessment & Plan   1. Uncontrolled type 2 diabetes mellitus without complication, without long-term current use of insulin ([place]) Uncontrolled due to access to health care. He gets lose BMs with 1000mg  dose of metformin but is willing to return to 500 mg po tid along with lifestyle changes. 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  Patient's Hba1c goal is <6.5% for stringent control.  Patient's Hba1c goal is <7%  is acceptable  Reviewed diet, exercise, lifestyle changes and current medication regimen pertaining to diabetes with the patient.   Reminded patient of the required annual dilated retinal exam.   - POCT HgB A1C - POCT UA - Microalbumin - metFORMIN (GLUCOPHAGE) 500 MG tablet; Take 1 tablet (500  mg total) by mouth 3 (three) times daily.  Dispense: 270 tablet; Refill: 1  2. Cervical disc disorder with radiculopathy Working with his lawyers to get settlement with his previous job.  - traMADol (ULTRAM) 50 MG tablet; Take 1 tablet (50 mg total) by mouth every 6 (six) hours as needed.  Dispense: 120 tablet; Refill: 0 - traMADol (ULTRAM) 50 MG tablet; Take 1 tablet (50 mg total) by mouth every 6 (six) hours as needed for moderate pain or severe pain.  Dispense: 120 tablet; Refill: 0 - traMADol (ULTRAM) 50 MG tablet; Take 1 tablet (50 mg total) by mouth every 6 (six) hours as needed for moderate pain or severe pain.  Dispense: 120 tablet; Refill: 0 - Hydrocodone-Acetaminophen (VICODIN) 5-300 MG TABS; Take 1 tablet by mouth every 8 (eight) hours.  Dispense: 90 each; Refill: 0 - cyclobenzaprine (FLEXERIL) 10 MG tablet; Take 1 tablet (10 mg total) by mouth 3 (three) times daily as needed.  Dispense: 90 tablet; Refill: 3 - gabapentin (NEURONTIN) 100 MG capsule; Take 1 capsule (100 mg total) by mouth at bedtime.  Dispense: 90 capsule; Refill: 1  3. Spondylosis of lumbar region without myelopathy or radiculopathy Restarted pain medication regimen.   The patient has been prescribed a controled substance today under the agreement of a filed pain treatment regimen contract.  With use of this medication they verbalize understanding the potential risk of addiction, abuse, and misuse, which can lead to overdose and death. The patient may not obtain and use other illicit or controled substances from any other sources while under the aformentioned contract. A urine drug screen will be performed periodically and the  patient's name will be reviewed on the [place] Controlled Substance Reporting System regularly.  The patient expresses understanding the above statement and agreement to comply.  The patient has been counseled on the proper use, side effects and potential interactions of the new medication with other prescribed and OTC medications. Under no circumstances is this (and any other) medication to be use with alcohol or other illicit drugs. This medication is not to be crushed, chewed, sniffed, injected or used in any other way other than what is stated in the directions. This medication is to be used at the frequency and quantity that is stated in the directions. This medication is to be used only by the individual who's name is on the prescription bottle. Drug sharing and selling is unacceptable. Patient voices understanding what has been said today.  - traMADol (ULTRAM) 50 MG tablet; Take 1 tablet (50 mg total) by mouth every 6 (six) hours as needed.  Dispense: 120 tablet; Refill: 0 - traMADol (ULTRAM) 50 MG tablet; Take 1 tablet (50 mg total) by mouth every 6 (six) hours as needed for moderate pain or severe pain.  Dispense: 120 tablet; Refill: 0 - traMADol (ULTRAM) 50 MG tablet; Take 1 tablet (50 mg total) by mouth every 6 (six) hours as needed for moderate pain or severe pain.  Dispense: 120 tablet; Refill: 0 - Hydrocodone-Acetaminophen (VICODIN) 5-300 MG TABS; Take 1 tablet by mouth every 8 (eight) hours.  Dispense: 90 each; Refill: 0 - cyclobenzaprine (FLEXERIL) 10 MG tablet; Take 1 tablet (10 mg total) by mouth 3 (three) times daily as needed.  Dispense: 90 tablet; Refill: 3  4. Osteoarthritis of spine with radiculopathy, lumbar region  - traMADol (ULTRAM) 50 MG tablet; Take 1 tablet (50 mg total) by mouth every 6 (six) hours as needed.  Dispense: 120 tablet; Refill: 0 - traMADol (ULTRAM)  50 MG tablet; Take 1 tablet (50 mg total) by mouth every 6 (six) hours as needed for moderate pain or severe pain.   Dispense: 120 tablet; Refill: 0 - traMADol (ULTRAM) 50 MG tablet; Take 1 tablet (50 mg total) by mouth every 6 (six) hours as needed for moderate pain or severe pain.  Dispense: 120 tablet; Refill: 0 - Hydrocodone-Acetaminophen (VICODIN) 5-300 MG TABS; Take 1 tablet by mouth every 8 (eight) hours.  Dispense: 90 each; Refill: 0 - cyclobenzaprine (FLEXERIL) 10 MG tablet; Take 1 tablet (10 mg total) by mouth 3 (three) times daily as needed.  Dispense: 90 tablet; Refill: 3  5. COPD suggested by initial evaluation [facility]) Fairly new diagnosis. Needs to quit smoking.  - albuterol (PROVENTIL HFA) 108 (90 Base) MCG/ACT inhaler; Inhale 1-2 puffs into the lungs every 6 (six) hours as needed.  Dispense: 18 g; Refill: 3  6. Cannot sleep Refilled.  - clonazePAM (KLONOPIN) 1 MG tablet; Take 1 tablet (1 mg total) by mouth at bedtime.  Dispense: 90 tablet; Refill: 1

## 2016-01-17 ENCOUNTER — Ambulatory Visit (INDEPENDENT_AMBULATORY_CARE_PROVIDER_SITE_OTHER): Payer: BLUE CROSS/BLUE SHIELD | Admitting: Family Medicine

## 2016-01-17 ENCOUNTER — Encounter: Payer: Self-pay | Admitting: Family Medicine

## 2016-01-17 ENCOUNTER — Telehealth: Payer: Self-pay

## 2016-01-17 VITALS — BP 132/80 | HR 104 | Temp 99.4°F | Resp 16 | Wt 251.8 lb

## 2016-01-17 DIAGNOSIS — J441 Chronic obstructive pulmonary disease with (acute) exacerbation: Secondary | ICD-10-CM | POA: Diagnosis not present

## 2016-01-17 DIAGNOSIS — E1165 Type 2 diabetes mellitus with hyperglycemia: Secondary | ICD-10-CM

## 2016-01-17 DIAGNOSIS — Z23 Encounter for immunization: Secondary | ICD-10-CM | POA: Diagnosis not present

## 2016-01-17 DIAGNOSIS — IMO0001 Reserved for inherently not codable concepts without codable children: Secondary | ICD-10-CM

## 2016-01-17 MED ORDER — PREDNISONE 20 MG PO TABS: 20.0000 mg | ORAL_TABLET | Freq: Every day | ORAL | Status: DC

## 2016-01-17 MED ORDER — MOXIFLOXACIN HCL 400 MG PO TABS: 400.0000 mg | ORAL_TABLET | Freq: Every day | ORAL | Status: DC

## 2016-01-17 MED ORDER — HYDROCOD POLST-CPM POLST ER 10-8 MG/5ML PO SUER: 5.0000 mL | Freq: Two times a day (BID) | ORAL | Status: DC | PRN

## 2016-01-17 MED ORDER — GLIPIZIDE 5 MG PO TABS: 2.5000 mg | ORAL_TABLET | Freq: Two times a day (BID) | ORAL | Status: DC

## 2016-01-17 MED ORDER — FLUTICASONE FUROATE-VILANTEROL 100-25 MCG/INH IN AEPB: 1.0000 | INHALATION_SPRAY | Freq: Every day | RESPIRATORY_TRACT | Status: DC

## 2016-01-17 NOTE — Progress Notes (Signed)
Name: Joel Page   MRN: [id number]    DOB: Nov 14, 1971   Date:01/17/2016       Progress Note  Subjective  Chief Complaint  Chief Complaint  Patient presents with  . Bronchitis    patient presents with productive cough (green) and bloody nasal mucus. otc: zyrtec    HPI  COPD exacerbation: he had an URI last week and over the past weekend he has noticed a lot of wheezing, some dyspnea and productive cough. He also has some post-nasal drainage. No fever. He states he has been feeling tired, appetite is normal. No change in bowel movements  DMII: he states last hgbA1c was elevated because he had an insurance gap and was without metformin, explained that he needs to be very compliant with his diet while on prednisone and we will add Glipizide for the next month. He needs to monitor his glucose at home.   Patient Active Problem List   Diagnosis Date Noted  . COPD suggested by initial evaluation ([place]) 12/22/2015  . 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  . Diabetes mellitus type 2, uncontrolled ([place]) 05/11/2015  . Current smoker 05/11/2015  . Epididymitis 05/11/2015  . Hemorrhoids, internal 05/11/2015  . Arthralgia of hip 05/11/2015  . Cannot sleep 05/11/2015  . Adiposity 05/11/2015  . 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  . Restless leg 05/11/2015  . Compulsive tobacco user syndrome 12/11/2013  . Trochanteric bursitis of right hip 06/17/2013  . Sciatica 03/19/2012  . Lumbar degenerative disc disease 03/19/2012    Past Surgical History  Procedure Laterality Date  . Sphincterotomy    . Excisional hemorrhoidectomy  2012  . Orif femur fracture  1989  . Tooth extraction      Family History  Problem Relation Age of Onset  . Lung cancer Mother   . Dementia Father   . COPD Father     Social History   Social History  . Marital Status: Single    Spouse Name: N/A  . Number of Children: N/A  . Years of Education: N/A    Occupational History  .  [employer]   Social History Main Topics  . Smoking status: Current Every Day Smoker -- 1.00 packs/day    Types: Cigarettes  . Smokeless tobacco: Former Systems developer  . Alcohol Use: Yes  . Drug Use: Not on file  . Sexual Activity: Not on file   Other Topics Concern  . Not on file   Social History Narrative     Current outpatient prescriptions:  .  albuterol (PROVENTIL HFA) 108 (90 Base) MCG/ACT inhaler, Inhale 1-2 puffs into the lungs every 6 (six) hours as needed., Disp: 18 g, Rfl: 3 .  Blood Glucose Monitoring Suppl (BAYER CONTOUR NEXT MONITOR) w/Device KIT, 1 Device by Does not apply route 1 day or 1 dose. DX: DM E11.5, Disp: 1 kit, Rfl: 0 .  buPROPion (WELLBUTRIN SR) 150 MG 12 hr tablet, Take 1 tablet by mouth 2 (two) times daily., Disp: , Rfl:  .  clonazePAM (KLONOPIN) 1 MG tablet, Take 1 tablet (1 mg total) by mouth at bedtime., Disp: 90 tablet, Rfl: 1 .  cyclobenzaprine (FLEXERIL) 10 MG tablet, Take 1 tablet (10 mg total) by mouth 3 (three) times daily as needed., Disp: 90 tablet, Rfl: 3 .  diphenhydrAMINE (BENADRYL ALLERGY) 25 mg capsule, Take 1 tablet by mouth as needed., Disp: , Rfl:  .  fluticasone furoate-vilanterol (BREO ELLIPTA) 100-25 MCG/INH AEPB, Inhale 1 puff into the  lungs daily., Disp: 60 each, Rfl: 0 .  gabapentin (NEURONTIN) 100 MG capsule, Take 1 capsule (100 mg total) by mouth at bedtime., Disp: 90 capsule, Rfl: 1 .  glipiZIDE (GLUCOTROL) 5 MG tablet, Take 0.5 tablets (2.5 mg total) by mouth 2 (two) times daily before a meal., Disp: 60 tablet, Rfl: 0 .  glucose blood (BAYER CONTOUR NEXT TEST) test strip, 100 each by Other route 2 (two) times daily. Use as instructed DX: DM E11.5, Disp: 100 each, Rfl: 3 .  Hydrocodone-Acetaminophen (VICODIN) 5-300 MG TABS, Take 1 tablet by mouth every 8 (eight) hours., Disp: 90 each, Rfl: 0 .  metFORMIN (GLUCOPHAGE) 500 MG tablet, Take 1 tablet (500 mg total) by mouth 3 (three) times daily., Disp: 270  tablet, Rfl: 1 .  moxifloxacin (AVELOX) 400 MG tablet, Take 1 tablet (400 mg total) by mouth daily at 8 pm., Disp: 7 tablet, Rfl: 0 .  Naproxen Sodium (ALEVE) 220 MG CAPS, Take 1 tablet by mouth as needed., Disp: , Rfl:  .  predniSONE (DELTASONE) 20 MG tablet, Take 1 tablet (20 mg total) by mouth daily with breakfast., Disp: 10 tablet, Rfl: 0 .  traMADol (ULTRAM) 50 MG tablet, Take 1 tablet (50 mg total) by mouth every 6 (six) hours as needed., Disp: 120 tablet, Rfl: 0 .  traMADol (ULTRAM) 50 MG tablet, Take 1 tablet (50 mg total) by mouth every 6 (six) hours as needed for moderate pain or severe pain., Disp: 120 tablet, Rfl: 0 .  traMADol (ULTRAM) 50 MG tablet, Take 1 tablet (50 mg total) by mouth every 6 (six) hours as needed for moderate pain or severe pain., Disp: 120 tablet, Rfl: 0  Allergies  Allergen Reactions  . Trazodone Other (See Comments)    Makes tingling & numbness in hands & arms worse; causes an erection.  . Baclofen Itching  . Diclofenac Sodium Other (See Comments)    Raised blood sugar      ROS  Ten systems reviewed and is negative except as mentioned in HPI   Objective  Filed Vitals:   01/17/16 1501  BP: 132/80  Pulse: 104  Temp: 99.4 F (37.4 C)  TempSrc: Oral  Resp: 16  Weight: 251 lb 12.8 oz (114.216 kg)  SpO2: 96%    Body mass index is 38.3 kg/(m^2).  Physical Exam  Constitutional: Patient appears well-developed and well-nourished. Obese . Mild respiratory distress HEENT: head atraumatic, normocephalic, pupils equal and reactive to light, ears TM, neck supple, throat within normal limits Cardiovascular: Normal rate, regular rhythm and normal heart sounds.  No murmur heard. No BLE edema. Pulmonary/Chest: Effort normal , he has diffuse rhonchi and expiratory wheezing. Mild respiratory distress Abdominal: Soft.  There is no tenderness. Psychiatric: Patient has a normal mood and affect. behavior is normal. Judgment and thought content normal.  Recent  Results (from the past 2160 hour(s))  POCT HgB A1C     Status: Abnormal   Collection Time: 12/22/15 11:15 AM  Result Value Ref Range   Hemoglobin A1C 8.7   POCT UA - Microalbumin     Status: Normal   Collection Time: 12/22/15 11:59 AM  Result Value Ref Range   Microalbumin Ur, POC neg mg/L   Creatinine, POC  mg/dL   Albumin/Creatinine Ratio, Urine, POC      PHQ2/9: Depression screen [facility] 2/9 01/17/2016 12/22/2015 05/11/2015  Decreased Interest 0 0 0  Down, Depressed, Hopeless 0 0 1  PHQ - 2 Score 0 0 1  Fall Risk: Fall Risk  01/17/2016 12/22/2015 05/11/2015  Falls in the past year? No No No      Functional Status Survey: Is the patient deaf or have difficulty hearing?: No Does the patient have difficulty seeing, even when wearing glasses/contacts?: No Does the patient have difficulty concentrating, remembering, or making decisions?: No Does the patient have difficulty walking or climbing stairs?: No Does the patient have difficulty dressing or bathing?: No Does the patient have difficulty doing errands alone such as visiting a doctor's office or shopping?: No    Assessment & Plan  1. COPD with exacerbation ([facility])  - fluticasone furoate-vilanterol (BREO ELLIPTA) 100-25 MCG/INH AEPB; Inhale 1 puff into the lungs daily.  Dispense: 60 each; Refill: 0 - predniSONE (DELTASONE) 20 MG tablet; Take 1 tablet (20 mg total) by mouth daily with breakfast.  Dispense: 10 tablet; Refill: 0 - moxifloxacin (AVELOX) 400 MG tablet; Take 1 tablet (400 mg total) by mouth daily at 8 pm.  Dispense: 7 tablet; Refill: 0 - chlorpheniramine-HYDROcodone (TUSSIONEX PENNKINETIC ER) 10-8 MG/5ML SUER; Take 5 mLs by mouth every 12 (twelve) hours as needed.  Dispense: 140 mL; Refill: 0  2. Uncontrolled type 2 diabetes mellitus without complication, without long-term current use of insulin ([facility])  - glipiZIDE (GLUCOTROL) 5 MG tablet; Take 0.5 tablets (2.5 mg total) by mouth 2 (two) times daily before a meal.   Dispense: 60 tablet; Refill: 0  3. Needs flu shot  - Flu Vaccine QUAD 36+ mos IM  4. Need for pneumococcal vaccination  - Pneumococcal polysaccharide vaccine 23-valent greater than or equal to 2yo subcutaneous/IM

## 2016-01-17 NOTE — Telephone Encounter (Signed)
Patient wanted to see if you would prescribe antibiotic for his symptoms.

## 2016-02-23 ENCOUNTER — Ambulatory Visit (INDEPENDENT_AMBULATORY_CARE_PROVIDER_SITE_OTHER): Payer: BLUE CROSS/BLUE SHIELD | Admitting: Family Medicine

## 2016-02-23 VITALS — BP 130/74 | HR 94 | Temp 98.5°F | Resp 16 | Wt 248.0 lb

## 2016-02-23 DIAGNOSIS — M5136 Other intervertebral disc degeneration, lumbar region: Secondary | ICD-10-CM | POA: Diagnosis not present

## 2016-02-23 DIAGNOSIS — Z79899 Other long term (current) drug therapy: Secondary | ICD-10-CM | POA: Diagnosis not present

## 2016-02-23 DIAGNOSIS — M5010 Cervical disc disorder with radiculopathy, unspecified cervical region: Secondary | ICD-10-CM

## 2016-02-23 NOTE — Assessment & Plan Note (Signed)
Refer to physical therapy; refer to pain clinic; explained I will not be refilling narcotics; suggested turmeric, tylenol, topicals, may benefit from a TENS unit

## 2016-02-23 NOTE — Patient Instructions (Addendum)
I've put in orders for an MRI of your neck as well as a referral to Dr. [name] at [facility] If you have not heard anything from my staff in a week about any orders/referrals/studies from today, please contact us here to follow-up [phone] Return for labs on or after May 2nd for diabetes Please do see your eye doctor regularly, and have your eyes examined every year (or more often per his or her recommendation) Check your feet every night and let me know right away of any sores, infections, numbness, etc. Try to limit sweets, white bread, white rice, white potatoes It is okay with me for you to not check your fingerstick blood sugars (per [employer]ocrinology Best Practices), unless you are interested and feel it would be helpful for you Try turmeric as a natural anti-inflammatory (for pain and arthritis). It comes in capsules where you buy aspirin and fish oil, but also as a spice where you buy pepper and garlic powder. Taper off of your clonazepam, decrease by 0.25 mg per dose each week; for example: April 5th - 11th: 0.75 mg at bedtime April 12th - 18th: 0.5 mg at bedtime April 19th - 25th: 0.25 mg at bedtime, then stop Take melatonin 3 mg at the EXACT same time of night for 3 weeks, that may help reset clock No caffeine or tea or chocolate after noon Keep up the good work with weight loss   Insomnia Insomnia is a sleep disorder that makes it difficult to fall asleep or to stay asleep. Insomnia can cause tiredness (fatigue), low energy, difficulty concentrating, mood swings, and poor performance at work or school.  There are three different ways to classify insomnia:  Difficulty falling asleep.  Difficulty staying asleep.  Waking up too early in the morning. Any type of insomnia can be long-term (chronic) or short-term (acute). Both are common. Short-term insomnia usually lasts for three months or less. Chronic insomnia occurs at least three times a week for longer than three  months. CAUSES  Insomnia may be caused by another condition, situation, or substance, such as:  Anxiety.  Certain medicines.  Gastroesophageal reflux disease (GERD) or other gastrointestinal conditions.  Asthma or other breathing conditions.  Restless legs syndrome, sleep apnea, or other sleep disorders.  Chronic pain.  Menopause. This may include hot flashes.  Stroke.  Abuse of alcohol, tobacco, or illegal drugs.  Depression.  Caffeine.   Neurological disorders, such as Alzheimer disease.  An overactive thyroid (hyperthyroidism). The cause of insomnia may not be known. RISK FACTORS Risk factors for insomnia include:  Gender. Women are more commonly affected than men.  Age. Insomnia is more common as you get older.  Stress. This may involve your professional or personal life.  Income. Insomnia is more common in people with lower income.  Lack of exercise.   Irregular work schedule or night shifts.  Traveling between different time zones. SIGNS AND SYMPTOMS If you have insomnia, trouble falling asleep or trouble staying asleep is the main symptom. This may lead to other symptoms, such as:  Feeling fatigued.  Feeling nervous about going to sleep.  Not feeling rested in the morning.  Having trouble concentrating.  Feeling irritable, anxious, or depressed. TREATMENT  Treatment for insomnia depends on the cause. If your insomnia is caused by an underlying condition, treatment will focus on addressing the condition. Treatment may also include:   Medicines to help you sleep.  Counseling or therapy.  Lifestyle adjustments. HOME CARE INSTRUCTIONS  Take medicines only as directed by your health care provider.  Keep regular sleeping and waking hours. Avoid naps.  Keep a sleep diary to help you and your health care provider figure out what could be causing your insomnia. Include:   When you sleep.  When you wake up during the night.  How well you  sleep.   How rested you feel the next day.  Any side effects of medicines you are taking.  What you eat and drink.   Make your bedroom a comfortable place where it is easy to fall asleep:  Put up shades or special blackout curtains to block light from outside.  Use a white noise machine to block noise.  Keep the temperature cool.   Exercise regularly as directed by your health care provider. Avoid exercising right before bedtime.  Use relaxation techniques to manage stress. Ask your health care provider to suggest some techniques that may work well for you. These may include:  Breathing exercises.  Routines to release muscle tension.  Visualizing peaceful scenes.  Cut back on alcohol, caffeinated beverages, and cigarettes, especially close to bedtime. These can disrupt your sleep.  Do not overeat or eat spicy foods right before bedtime. This can lead to digestive discomfort that can make it hard for you to sleep.  Limit screen use before bedtime. This includes:  Watching TV.  Using your smartphone, tablet, and computer.  Stick to a routine. This can help you fall asleep faster. Try to do a quiet activity, brush your teeth, and go to bed at the same time each night.  Get out of bed if you are still awake after 15 minutes of trying to sleep. Keep the lights down, but try reading or doing a quiet activity. When you feel sleepy, go back to bed.  Make sure that you drive carefully. Avoid driving if you feel very sleepy.  Keep all follow-up appointments as directed by your health care provider. This is important. SEEK MEDICAL CARE IF:   You are tired throughout the day or have trouble in your daily routine due to sleepiness.  You continue to have sleep problems or your sleep problems get worse. SEEK IMMEDIATE MEDICAL CARE IF:   You have serious thoughts about hurting yourself or someone else.   This information is not intended to replace advice given to you by your  health care provider. Make sure you discuss any questions you have with your health care provider.   Document Released: 11/03/2000 Document Revised: 07/28/2015 Document Reviewed: 08/07/2014 Elsevier Interactive Patient Education [employer].

## 2016-02-23 NOTE — Progress Notes (Signed)
BP 130/74 mmHg  Pulse 94  Temp(Src) 98.5 F (36.9 C) (Oral)  Resp 16  Wt 248 lb (112.492 kg)  SpO2 97%   Subjective:    Patient ID: Joel Page, male    DOB: 1971/07/16, 45 y.o.   MRN: [id number]  HPI: Joel Page is a 45 y.o. male  Chief Complaint  Patient presents with  . Medication Refill  . Diabetes    Currently not checking due to cost on strips.  Patient will talk with pharmacy to see what they will cover  . Neck Pain    has ruputred disc in c-spine, t-spine and l- spine and having some numbness in left arm   Diabetes; type 2; A1c had gone up in February and it was 8.7; he had been lost to follow-up for a few months; he ran out of medicines for a while; gained weight; he has metformin, but not taking regularly; eye exam and there may be some effect of tizanidine  He has spinal stenosis; turns his head to the right and then gets numbness in the left shoulder; he does not have a spine doctor or arthritis doctor; he used to have a pain management in [place] but he quit going, he was given him voltaren cream for a disc; disk is touching spinal cord; he is right handed; has lost grip strength in the left hand and will drop things; pain on the left shoulder and numbness on the left shoulder; later on that day, sharp pain on the right deltoid; spur in the right shoulder; pain medicine helps him function; a doctor in [place] put him on something to try to help with pain, started with a T and   Has been headaches lately, on tramadol and thinks that causes headaches; has to take it regularly; only uses hydrocodone when he is having a bad day; he knows to not take the tramadol and hydrocodone together; takes tramadol regularly, just the hydrocodone when he starts cramping up; he uses naproxen; also uses ibuprofen occasionally, but I immediately told him to NOT take them both; he says he doesn't; the naproxen works better; switched between them at times; he is not  doing physical therapy  Relevant past medical, surgical, family and social history reviewed Past Medical History  Diagnosis Date  . Diabetes mellitus   . DDD (degenerative disc disease)   . Allergy   . 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 Lumbar stenosis   . Insomnia    Past Surgical History  Procedure Laterality Date  . Sphincterotomy    . Excisional hemorrhoidectomy  2012  . Orif femur fracture  1989  . Tooth extraction     Interim medical history since our last visit reviewed. Allergies and medications reviewed and updated.  Review of Systems Per HPI unless specifically indicated above     Objective:    BP 130/74 mmHg  Pulse 94  Temp(Src) 98.5 F (36.9 C) (Oral)  Resp 16  Wt 248 lb (112.492 kg)  SpO2 97%  Wt Readings from Last 3 Encounters:  02/23/16 248 lb (112.492 kg)  01/17/16 251 lb 12.8 oz (114.216 kg)  12/22/15 258 lb 11.2 oz (117.346 kg)    Physical Exam  Constitutional: He appears well-developed and well-nourished. No distress.  HENT:  Head: Normocephalic and atraumatic.  Eyes: EOM are normal. No scleral icterus.  Neck: No JVD present.  Cardiovascular: Normal rate and regular rhythm.   Pulmonary/Chest: Effort  normal and breath sounds normal.  Neurological: He is alert.  Skin: No pallor.  Psychiatric: He has a normal mood and affect.   Results for orders placed or performed in visit on 12/22/15  POCT HgB A1C  Result Value Ref Range   Hemoglobin A1C 8.7   POCT UA - Microalbumin  Result Value Ref Range   Microalbumin Ur, POC neg mg/L   Creatinine, POC  mg/dL   Albumin/Creatinine Ratio, Urine, POC        Assessment & Plan:   Problem List Items Addressed This Visit      Musculoskeletal and Integument   Lumbar degenerative disc disease    Refer to physical therapy; refer to pain clinic; explained I will not be refilling narcotics; suggested turmeric, tylenol, topicals, may benefit from a TENS unit       Cervical disc disorder with radiculopathy - Primary    Order MRI of cervical spine and refer back to Dr. Chauncy Passy; I do not plan on refilling narcotics, will refer to pain clinic; refer back to spine specialist      Relevant Orders   MR Cervical Spine Wo Contrast   Ambulatory referral to Orthopedic Surgery   Ambulatory referral to Pain Clinic     Other   Long term prescription benzodiazepine use    Patient was on benzodiazepine and narcotics prior to my meeting him today; I explained the risk of taking both, warning issued last summer about danger of combination benzo plus narcotic; I will wean him off of the benzo; see AVS         Follow up plan: Return in about 4 weeks (around 03/21/2016) for fasting labs and visit for diabetes.   An after-visit summary was printed and given to the patient at [place].  Please see the patient instructions which may contain other information and recommendations beyond what is mentioned above in the assessment and plan.

## 2016-02-23 NOTE — Assessment & Plan Note (Addendum)
Order MRI of cervical spine and refer back to Dr. Chauncy Passy; I do not plan on refilling narcotics, will refer to pain clinic; refer back to spine specialist

## 2016-03-12 DIAGNOSIS — Z79899 Other long term (current) drug therapy: Secondary | ICD-10-CM | POA: Insufficient documentation

## 2016-03-12 NOTE — Assessment & Plan Note (Signed)
Patient was on benzodiazepine and narcotics prior to my meeting him today; I explained the risk of taking both, warning issued last summer about danger of combination benzo plus narcotic; I will wean him off of the benzo; see AVS

## 2016-03-20 ENCOUNTER — Telehealth: Payer: Self-pay | Admitting: Family Medicine

## 2016-03-20 ENCOUNTER — Ambulatory Visit
Admission: RE | Admit: 2016-03-20 | Discharge: 2016-03-20 | Disposition: A | Discharge status: Home / Self Care | Payer: BLUE CROSS/BLUE SHIELD | Source: Ambulatory Visit | Attending: Family Medicine | Admitting: Family Medicine

## 2016-03-20 DIAGNOSIS — M50323 Other cervical disc degeneration at C6-C7 level: Secondary | ICD-10-CM | POA: Insufficient documentation

## 2016-03-20 DIAGNOSIS — M47892 Other spondylosis, cervical region: Secondary | ICD-10-CM | POA: Insufficient documentation

## 2016-03-20 DIAGNOSIS — M50321 Other cervical disc degeneration at C4-C5 level: Secondary | ICD-10-CM | POA: Insufficient documentation

## 2016-03-20 DIAGNOSIS — M50322 Other cervical disc degeneration at C5-C6 level: Secondary | ICD-10-CM | POA: Diagnosis not present

## 2016-03-20 DIAGNOSIS — M5010 Cervical disc disorder with radiculopathy, unspecified cervical region: Secondary | ICD-10-CM | POA: Diagnosis present

## 2016-03-20 NOTE — Telephone Encounter (Signed)
I tried to call pt with results of MRI; reached voicemail; asked him to call back to go over results ----------------- Roselyn Reef, please let patient know that he does indeed have narrowing where the nerves leave to go out to the body as well as disc bulging We put referrals in to pain clinic and Dr. [name] back on April 5th, so I hope he has appts with them soon He can request a copy of his MRI on disc to take to his appts with him Thank you

## 2016-03-21 ENCOUNTER — Encounter: Payer: Self-pay | Admitting: Family Medicine

## 2016-03-21 NOTE — Telephone Encounter (Signed)
Ok clinical review called back about getting patients MRI approved.  They need me to fax all notes and they will also need a letter from you stating medical reasons on why you ordered including: Diagnosis,symtoms,xrays,medication tried and failed, what you were trying to rule out, how long this has been going on for and how many visits you have treated for.  They told me this needs to be faxed in no later than tomorrow afternoon or it will be denied.

## 2016-03-21 NOTE — Telephone Encounter (Signed)
Letter is ready to be faxed, along with my notes; thank you

## 2016-03-22 NOTE — Telephone Encounter (Signed)
Done; faxed

## 2016-03-23 ENCOUNTER — Telehealth: Payer: Self-pay | Admitting: Family Medicine

## 2016-03-23 NOTE — Telephone Encounter (Signed)
Joel Page from [facility] MRI Cervical Spine at [facility] 03-22-16 and is good for 30 days Authorization #: [id number] (P) [id number]

## 2016-03-24 ENCOUNTER — Ambulatory Visit: Payer: BLUE CROSS/BLUE SHIELD | Admitting: Family Medicine

## 2016-03-27 ENCOUNTER — Ambulatory Visit: Payer: BLUE CROSS/BLUE SHIELD | Admitting: Family Medicine

## 2016-03-30 ENCOUNTER — Encounter: Payer: Self-pay | Admitting: Family Medicine

## 2016-03-30 ENCOUNTER — Ambulatory Visit
Admission: RE | Admit: 2016-03-30 | Discharge: 2016-03-30 | Disposition: A | Discharge status: Home / Self Care | Payer: BLUE CROSS/BLUE SHIELD | Source: Ambulatory Visit | Attending: Family Medicine | Admitting: Family Medicine

## 2016-03-30 ENCOUNTER — Ambulatory Visit (INDEPENDENT_AMBULATORY_CARE_PROVIDER_SITE_OTHER): Payer: BLUE CROSS/BLUE SHIELD | Admitting: Family Medicine

## 2016-03-30 VITALS — BP 126/82 | HR 100 | Temp 99.1°F | Resp 16 | Wt 240.0 lb

## 2016-03-30 DIAGNOSIS — R918 Other nonspecific abnormal finding of lung field: Secondary | ICD-10-CM | POA: Insufficient documentation

## 2016-03-30 DIAGNOSIS — E1165 Type 2 diabetes mellitus with hyperglycemia: Secondary | ICD-10-CM

## 2016-03-30 DIAGNOSIS — Z5181 Encounter for therapeutic drug level monitoring: Secondary | ICD-10-CM

## 2016-03-30 DIAGNOSIS — M5010 Cervical disc disorder with radiculopathy, unspecified cervical region: Secondary | ICD-10-CM

## 2016-03-30 DIAGNOSIS — R634 Abnormal weight loss: Secondary | ICD-10-CM

## 2016-03-30 DIAGNOSIS — IMO0001 Reserved for inherently not codable concepts without codable children: Secondary | ICD-10-CM

## 2016-03-30 DIAGNOSIS — R05 Cough: Secondary | ICD-10-CM

## 2016-03-30 DIAGNOSIS — R059 Cough, unspecified: Secondary | ICD-10-CM

## 2016-03-30 LAB — POCT GLYCOSYLATED HEMOGLOBIN (HGB A1C): Hemoglobin A1C: 8.2

## 2016-03-30 MED ORDER — BENZONATATE 100 MG PO CAPS: 100.0000 mg | ORAL_CAPSULE | Freq: Three times a day (TID) | ORAL | Status: AC | PRN

## 2016-03-30 MED ORDER — PREDNISONE 20 MG PO TABS: ORAL_TABLET | ORAL | Status: AC

## 2016-03-30 MED ORDER — METFORMIN HCL 500 MG PO TABS: 500.0000 mg | ORAL_TABLET | Freq: Two times a day (BID) | ORAL | Status: DC

## 2016-03-30 MED ORDER — LINAGLIPTIN 5 MG PO TABS: 5.0000 mg | ORAL_TABLET | Freq: Every day | ORAL | Status: DC

## 2016-03-30 NOTE — Assessment & Plan Note (Addendum)
Seeing pain clinic tomorrow, will see Dr. [name] soon

## 2016-03-30 NOTE — Assessment & Plan Note (Signed)
Check SGPT, Cr

## 2016-03-30 NOTE — Assessment & Plan Note (Signed)
In a smoker; chest xray, CBC, prednisone (don't stop abruptly), tessalon perles

## 2016-03-30 NOTE — Progress Notes (Signed)
BP 126/82 mmHg  Pulse 100  Temp(Src) 99.1 F (37.3 C) (Oral)  Resp 16  Wt 240 lb (108.863 kg)  SpO2 96%   Subjective:    Patient ID: Joel Page, male    DOB: 10/08/1971, 46 y.o.   MRN: [id number]  HPI: Joel Page is a 45 y.o. male  Chief Complaint  Patient presents with  . Follow-up  . Diabetes  . URI   Diabetes mellitus type 2 Patient has had diabetes since 2011 Checking sugars?  no, ran out of strips; has a new meter, accuchek, not covered and has to buy his own strips Feels diabetes is under good control Does patient feel additional teaching/training would be helpful?  yes Trying to limit white bread, white rice, white potatoes, sweets?  yes Trying to limit sweetened drinks like iced tea, soft drinks, sports drinks, fruit juices?  yes Exercise/activity level:  occasional (maybe once or twice a week) Checking feet every day/night?  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, white flashes around periphery of vision, just every once in a while; they put him on a seizure medication Diabetes-associated complications:  none  He found that something else can be helpful for men, better than metformin; berberine, supposed to work better for men he says; the metformin gives him GI upset, loose stools  Lab Results  Component Value Date   HGBA1C 8.2 03/30/2016   Coughing a lot; was bad on Monday and Tuesday; using some left-over steroids and that has been helping; chest hurts with coughing, in the joints; coughs so hard joints hurt; bringing up green and yellow phlegm He has been working on house a lot and breathing in dust and saw dead rats; wears respiratory; was having some night sweats; worked in prison; 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 [name]; he is having to use cups with handles; has had problems with the right UE and left LE; chronic back issues  too; see last MRI cervical spine  Depression screen [facility] 2/9 03/30/2016 01/17/2016 12/22/2015 05/11/2015  Decreased Interest 0 0 0 0  Down, Depressed, Hopeless 0 0 0 1  PHQ - 2 Score 0 0 0 1   Relevant past medical, surgical, family and social history reviewed Past Medical History  Diagnosis Date  . Diabetes mellitus   . DDD (degenerative disc disease)   . Allergy   . 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 Lumbar stenosis   . Insomnia    Past Surgical History  Procedure Laterality Date  . Sphincterotomy    . Excisional hemorrhoidectomy  2012  . Orif femur fracture  1989  . Tooth extraction     Family History  Problem Relation Age of Onset  . Lung cancer Mother   . Dementia Father   . COPD Father    Social History  Substance Use Topics  . Smoking status: Current Some Day Smoker -- 1.00 packs/day    Types: Cigarettes  . Smokeless tobacco: Former Systems developer  . Alcohol Use: 0.0 oz/week    0 Standard drinks or equivalent per week   Interim medical history since last visit reviewed. Allergies and medications reviewed  Review of Systems Per HPI unless specifically indicated above     Objective:    BP 126/82 mmHg  Pulse 100  Temp(Src) 99.1 F (37.3 C) (Oral)  Resp 16  Wt 240 lb (108.863 kg)  SpO2 96%  Wt Readings from Last 3 Encounters:  03/30/16 240 lb (108.863 kg)  02/23/16 248 lb (112.492 kg)  01/17/16 251 lb 12.8 oz (114.216 kg)    Physical Exam  Constitutional: He appears well-developed and well-nourished. No distress.  HENT:  Head: Normocephalic and atraumatic.  Mouth/Throat: Oropharynx is clear and moist and mucous membranes are normal.  Eyes: EOM are normal. No scleral icterus.  Neck: No thyromegaly present.  Cardiovascular: Normal rate and regular rhythm.   Pulmonary/Chest: Effort normal and breath sounds normal.  Coarse scattered breath sounds anterior and posterior, L+R  Abdominal: Soft. Bowel sounds are normal. He exhibits no  distension. There is no tenderness.  Musculoskeletal: He exhibits no edema.  Neurological: He is alert. Coordination normal.  Skin: Skin is warm and dry. No pallor.  Psychiatric: He has a normal mood and affect. His behavior is normal. Judgment and thought content normal.    Diabetic Foot Form - Detailed   Diabetic Foot Exam - detailed  Diabetic Foot exam was performed with the following findings:  Yes 03/30/2016 11:12 AM  Visual Foot Exam completed.:  Yes  Are the toenails long?:  No  Are the toenails thick?:  No  Are the toenails ingrown?:  No  Normal Range of Motion:  Yes    Pulse Foot Exam completed.:  Yes  Right Dorsalis Pedis:  Present Left Dorsalis Pedis:  Present  Sensory Foot Exam Completed.:  Yes  Semmes-Weinstein Monofilament Test  R Site 1-Great Toe:  Neg L Site 1-Great Toe:  Pos  R Site 4:  Neg L Site 4:  Pos  R Site 5:  Neg L Site 5:  Pos    Comments:  diminisehd sensation dorsum of left foot; only place he felt on the right was midsole      Results for orders placed or performed in visit on 03/30/16  POCT HgB A1C  Result Value Ref Range   Hemoglobin A1C 8.2       Assessment & Plan:   Problem List Items Addressed This Visit      Endocrine   Diabetes mellitus type 2, uncontrolled ([place]) - Primary    Refer to diabetic education for refresher course; check feet every night; eye exams yearly, daily 81 mg coated aspirin daily; foot exam by MD today; A1c done, 8.2, not to goal; he wants to decrease metformin (GI sx and what he's read on the internet); will add Tradjenta; next A1c in 3 months      Relevant Medications   metFORMIN (GLUCOPHAGE) 500 MG tablet   linagliptin (TRADJENTA) 5 MG TABS tablet   Other Relevant Orders   POCT HgB A1C (Completed)   Lipid Panel w/o Chol/HDL Ratio   Ambulatory referral to Nutrition and Diabetic Education     Musculoskeletal and Integument   Cervical disc disorder with radiculopathy    Seeing pain clinic tomorrow, will see Dr.  [name] soon        Other   Cough    In a smoker; chest xray, CBC, prednisone (don't stop abruptly), tessalon perles      Relevant Medications   benzonatate (TESSALON PERLES) 100 MG capsule   predniSONE (DELTASONE) 20 MG tablet   Other Relevant Orders   DG Chest 2 View (Completed)   CBC with Differential/Platelet   Medication monitoring encounter    Check SGPT, Cr      Relevant Orders   Comprehensive metabolic panel  Other Visit Diagnoses    Weight loss        chest xray and TSH ordered    Relevant Orders    TSH       Follow up plan: Return in about 3 months (around 06/30/2016) for fasting labs and visit.  An after-visit summary was printed and given to the patient at [place].  Please see the patient instructions which may contain other information and recommendations beyond what is mentioned above in the assessment and plan.  Meds ordered this encounter  Medications  . benzonatate (TESSALON PERLES) 100 MG capsule    Sig: Take 1 capsule (100 mg total) by mouth every 8 (eight) hours as needed for cough.    Dispense:  30 capsule    Refill:  0  . predniSONE (DELTASONE) 20 MG tablet    Sig: Two pill by mouth daily x 2 days, then one pill daily x 2 days, then 1/2 pill daily x 2 days    Dispense:  7 tablet    Refill:  0  . metFORMIN (GLUCOPHAGE) 500 MG tablet    Sig: Take 1 tablet (500 mg total) by mouth 2 (two) times daily with a meal.    Dispense:  180 tablet    Refill:  1  . linagliptin (TRADJENTA) 5 MG TABS tablet    Sig: Take 1 tablet (5 mg total) by mouth daily.    Dispense:  30 tablet    Refill:  5    Orders Placed This Encounter  Procedures  . DG Chest 2 View  . CBC with Differential/Platelet  . Lipid Panel w/o Chol/HDL Ratio  . Comprehensive metabolic panel  . TSH  . Ambulatory referral to Nutrition and Diabetic Education  . POCT HgB A1C

## 2016-03-30 NOTE — Patient Instructions (Addendum)
Please have labs and xray done today We'll contact you about the results If you have not heard anything from my staff by Friday afternoon about any orders/referrals/studies from today, please contact us here to follow-up [phone] Start my prescription for prednisone Your goal blood pressure is less than 130 mmHg on top. Try to follow the DASH guidelines (DASH stands for Dietary Approaches to Stop Hypertension) Try to limit the sodium in your diet.  Ideally, consume less than 1.5 grams (less than 1,500mg ) per day. Do not add salt when cooking or at the table.  Check the sodium amount on labels when shopping, and choose items lower in sodium when given a choice. Avoid or limit foods that already contain a lot of sodium. Eat a diet rich in fruits and vegetables and whole grains. Decrease the metformin to just twice a day Add Tradjecta for diabetes Return in 3 months

## 2016-03-30 NOTE — Assessment & Plan Note (Addendum)
Refer to diabetic education for refresher course; check feet every night; eye exams yearly, daily 81 mg coated aspirin daily; foot exam by MD today; A1c done, 8.2, not to goal; he wants to decrease metformin (GI sx and what he's read on the internet); will add Tradjenta; next A1c in 3 months

## 2016-03-31 ENCOUNTER — Ambulatory Visit: Payer: BLUE CROSS/BLUE SHIELD | Attending: Anesthesiology | Admitting: Anesthesiology

## 2016-03-31 ENCOUNTER — Encounter: Payer: Self-pay | Admitting: Anesthesiology

## 2016-03-31 ENCOUNTER — Encounter (INDEPENDENT_AMBULATORY_CARE_PROVIDER_SITE_OTHER): Payer: Self-pay

## 2016-03-31 VITALS — BP 125/70 | HR 96 | Temp 98.9°F | Resp 18 | Ht 69.0 in | Wt 239.0 lb

## 2016-03-31 DIAGNOSIS — M545 Low back pain, unspecified: Secondary | ICD-10-CM

## 2016-03-31 DIAGNOSIS — M5137 Other intervertebral disc degeneration, lumbosacral region: Secondary | ICD-10-CM

## 2016-03-31 DIAGNOSIS — G8929 Other chronic pain: Secondary | ICD-10-CM | POA: Diagnosis not present

## 2016-03-31 DIAGNOSIS — M5010 Cervical disc disorder with radiculopathy, unspecified cervical region: Secondary | ICD-10-CM | POA: Diagnosis not present

## 2016-03-31 DIAGNOSIS — M5116 Intervertebral disc disorders with radiculopathy, lumbar region: Secondary | ICD-10-CM | POA: Insufficient documentation

## 2016-03-31 DIAGNOSIS — M51379 Other intervertebral disc degeneration, lumbosacral region without mention of lumbar back pain or lower extremity pain: Secondary | ICD-10-CM

## 2016-03-31 DIAGNOSIS — F17210 Nicotine dependence, cigarettes, uncomplicated: Secondary | ICD-10-CM | POA: Insufficient documentation

## 2016-03-31 DIAGNOSIS — M5416 Radiculopathy, lumbar region: Secondary | ICD-10-CM

## 2016-03-31 DIAGNOSIS — M5030 Other cervical disc degeneration, unspecified cervical region: Secondary | ICD-10-CM

## 2016-03-31 DIAGNOSIS — M542 Cervicalgia: Secondary | ICD-10-CM

## 2016-03-31 LAB — LIPID PANEL W/O CHOL/HDL RATIO
CHOLESTEROL TOTAL: 212 mg/dL — AB (ref 100–199)
HDL: 25 mg/dL — AB (ref >39)
LDL Calculated: 141 mg/dL — ABNORMAL HIGH (ref 0–99)
TRIGLYCERIDES: 229 mg/dL — AB (ref 0–149)
VLDL Cholesterol Cal: 46 mg/dL — ABNORMAL HIGH (ref 5–40)

## 2016-03-31 LAB — CBC WITH DIFFERENTIAL/PLATELET
BASOS ABS: 0 10*3/uL (ref 0.0–0.2)
Basos: 0 %
EOS (ABSOLUTE): 0.1 10*3/uL (ref 0.0–0.4)
Eos: 1 %
Hematocrit: 46 % (ref 37.5–51.0)
Hemoglobin: 15.7 g/dL (ref 12.6–17.7)
IMMATURE GRANULOCYTES: 0 %
Immature Grans (Abs): 0 10*3/uL (ref 0.0–0.1)
LYMPHS ABS: 2.3 10*3/uL (ref 0.7–3.1)
Lymphs: 19 %
MCH: 29.3 pg (ref 26.6–33.0)
MCHC: 34.1 g/dL (ref 31.5–35.7)
MCV: 86 fL (ref 79–97)
MONOS ABS: 0.6 10*3/uL (ref 0.1–0.9)
Monocytes: 5 %
NEUTROS PCT: 75 %
Neutrophils Absolute: 9 10*3/uL — ABNORMAL HIGH (ref 1.4–7.0)
PLATELETS: 215 10*3/uL (ref 150–379)
RBC: 5.36 x10E6/uL (ref 4.14–5.80)
RDW: 14.6 % (ref 12.3–15.4)
WBC: 12 10*3/uL — AB (ref 3.4–10.8)

## 2016-03-31 LAB — COMPREHENSIVE METABOLIC PANEL
A/G RATIO: 1.7 (ref 1.2–2.2)
ALBUMIN: 4.6 g/dL (ref 3.5–5.5)
ALT: 22 IU/L (ref 0–44)
AST: 15 IU/L (ref 0–40)
Alkaline Phosphatase: 90 IU/L (ref 39–117)
BILIRUBIN TOTAL: 0.2 mg/dL (ref 0.0–1.2)
BUN / CREAT RATIO: 18 (ref 9–20)
BUN: 14 mg/dL (ref 6–24)
CHLORIDE: 99 mmol/L (ref 96–106)
CO2: 20 mmol/L (ref 18–29)
Calcium: 9.2 mg/dL (ref 8.7–10.2)
Creatinine, Ser: 0.8 mg/dL (ref 0.76–1.27)
GFR calc non Af Amer: 107 mL/min/{1.73_m2} (ref >59)
GFR, EST AFRICAN AMERICAN: 124 mL/min/{1.73_m2} (ref >59)
GLOBULIN, TOTAL: 2.7 g/dL (ref 1.5–4.5)
Glucose: 243 mg/dL — ABNORMAL HIGH (ref 65–99)
POTASSIUM: 4.9 mmol/L (ref 3.5–5.2)
SODIUM: 137 mmol/L (ref 134–144)
TOTAL PROTEIN: 7.3 g/dL (ref 6.0–8.5)

## 2016-03-31 LAB — TSH: TSH: 2.4 u[IU]/mL (ref 0.450–4.500)

## 2016-03-31 NOTE — Progress Notes (Signed)
Safety precautions to be maintained throughout the outpatient stay will include: orient to surroundings, keep bed in low position, maintain call bell within reach at all times, provide assistance with transfer out of bed and ambulation.  

## 2016-03-31 NOTE — Patient Instructions (Signed)
Epidural Steroid Injection Patient Information  Description: The epidural space surrounds the nerves as they exit the spinal cord.  In some patients, the nerves can be compressed and inflamed by a bulging disc or a tight spinal canal (spinal stenosis).  By injecting steroids into the epidural space, we can bring irritated nerves into direct contact with a potentially helpful medication.  These steroids act directly on the irritated nerves and can reduce swelling and inflammation which often leads to decreased pain.  Epidural steroids may be injected anywhere along the spine and from the neck to the low back depending upon the location of your pain.   After numbing the skin with local anesthetic (like Novocaine), a small needle is passed into the epidural space slowly.  You may experience a sensation of pressure while this is being done.  The entire block usually last less than 10 minutes.  Conditions which may be treated by epidural steroids:   Low back and leg pain  Neck and arm pain  Spinal stenosis  Post-laminectomy syndrome  Herpes zoster (shingles) pain  Pain from compression fractures  Preparation for the injection:  1. Do not eat any solid food or dairy products within 8 hours of your appointment.  2. You may drink clear liquids up to 3 hours before appointment.  Clear liquids include water, black coffee, juice or soda.  No milk or cream please. 3. You may take your regular medication, including pain medications, with a sip of water before your appointment  Diabetics should hold regular insulin (if taken separately) and take 1/2 normal NPH dos the morning of the procedure.  Carry some sugar containing items with you to your appointment. 4. A driver must accompany you and be prepared to drive you home after your procedure.  5. Bring all your current medications with your. 6. An IV may be inserted and sedation may be given at the discretion of the physician.   7. A blood pressure  cuff, EKG and other monitors will often be applied during the procedure.  Some patients may need to have extra oxygen administered for a short period. 8. You will be asked to provide medical information, including your allergies, prior to the procedure.  We must know immediately if you are taking blood thinners (like Coumadin/Warfarin)  Or if you are allergic to IV iodine contrast (dye). We must know if you could possible be pregnant.  Possible side-effects:  Bleeding from needle site  Infection (rare, may require surgery)  Nerve injury (rare)  Numbness & tingling (temporary)  Difficulty urinating (rare, temporary)  Spinal headache ( a headache worse with upright posture)  Light -headedness (temporary)  Pain at injection site (several days)  Decreased blood pressure (temporary)  Weakness in arm/leg (temporary)  Pressure sensation in back/neck (temporary)  Call if you experience:  Fever/chills associated with headache or increased back/neck pain.  Headache worsened by an upright position.  New onset weakness or numbness of an extremity below the injection site  Hives or difficulty breathing (go to the emergency room)  Inflammation or drainage at the infection site  Severe back/neck pain  Any new symptoms which are concerning to you  Please note:  Although the local anesthetic injected can often make your back or neck feel good for several hours after the injection, the pain will likely return.  It takes 3-7 days for steroids to work in the epidural space.  You may not notice any pain relief for at least that one week.    If effective, we will often do a series of three injections spaced 3-6 weeks apart to maximally decrease your pain.  After the initial series, we generally will wait several months before considering a repeat injection of the same type.  If you have any questions, please call [phone] [facility] 
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
## 2016-04-07 ENCOUNTER — Ambulatory Visit: Payer: BLUE CROSS/BLUE SHIELD | Attending: Anesthesiology | Admitting: Anesthesiology

## 2016-04-07 VITALS — BP 124/67 | HR 76 | Temp 97.0°F | Resp 13 | Ht 69.0 in | Wt 240.0 lb

## 2016-04-07 DIAGNOSIS — G8929 Other chronic pain: Secondary | ICD-10-CM | POA: Diagnosis not present

## 2016-04-07 DIAGNOSIS — M5116 Intervertebral disc disorders with radiculopathy, lumbar region: Secondary | ICD-10-CM | POA: Diagnosis not present

## 2016-04-07 DIAGNOSIS — M545 Low back pain, unspecified: Secondary | ICD-10-CM | POA: Insufficient documentation

## 2016-04-07 DIAGNOSIS — M4716 Other spondylosis with myelopathy, lumbar region: Secondary | ICD-10-CM | POA: Insufficient documentation

## 2016-04-07 MED ORDER — BUPIVACAINE HCL (PF) 0.25 % IJ SOLN: 20.0000 mL | Freq: Once | INTRAMUSCULAR | Status: DC

## 2016-04-07 MED ORDER — TRIAMCINOLONE ACETONIDE 40 MG/ML IJ SUSP: 80.0000 mg | Freq: Once | INTRAMUSCULAR | Status: DC

## 2016-04-07 MED ORDER — BUPIVACAINE HCL (PF) 0.25 % IJ SOLN
INTRAMUSCULAR | Status: AC
  Administered 2016-04-07: 13:00:00
  Filled 2016-04-07: qty 30

## 2016-04-07 MED ORDER — IOPAMIDOL (ISOVUE-M 200) INJECTION 41%: 20.0000 mL | INTRAMUSCULAR | Status: DC | PRN

## 2016-04-07 MED ORDER — FENTANYL CITRATE (PF) 100 MCG/2ML IJ SOLN
50.0000 ug | INTRAMUSCULAR | Status: DC
Start: 2016-04-07 — End: 2016-06-29

## 2016-04-07 MED ORDER — MIDAZOLAM HCL 5 MG/5ML IJ SOLN
INTRAMUSCULAR | Status: AC
  Administered 2016-04-07: 13:00:00
  Filled 2016-04-07: qty 5

## 2016-04-07 MED ORDER — TRIAMCINOLONE ACETONIDE 40 MG/ML IJ SUSP
INTRAMUSCULAR | Status: AC
  Administered 2016-04-07: 13:00:00
  Filled 2016-04-07: qty 2

## 2016-04-07 MED ORDER — FENTANYL CITRATE (PF) 100 MCG/2ML IJ SOLN
INTRAMUSCULAR | Status: AC
  Administered 2016-04-07: 100 ug
  Filled 2016-04-07: qty 2

## 2016-04-07 MED ORDER — IOPAMIDOL (ISOVUE-M 200) INJECTION 41%
INTRAMUSCULAR | Status: AC
  Administered 2016-04-07: 13:00:00
  Filled 2016-04-07: qty 10

## 2016-04-07 NOTE — Patient Instructions (Signed)
Epidural Steroid Injection An epidural steroid injection is given to relieve pain in your neck, back, or legs that is caused by the irritation or swelling of a nerve root. This procedure involves injecting a steroid and numbing medicine (anesthetic) into the epidural space. The epidural space is the space between the outer covering of your spinal cord and the bones that form your backbone (vertebra).  LET [facility] CARE PROVIDER KNOW ABOUT:   Any allergies you have.  All medicines you are taking, including vitamins, herbs, eye drops, creams, and over-the-counter medicines such as aspirin.  Previous problems you or members of your family have had with the use of anesthetics.  Any blood disorders or blood clotting disorders you have.  Previous surgeries you have had.  Medical conditions you have. RISKS AND COMPLICATIONS Generally, this is a safe procedure. However, as with any procedure, complications can occur. Possible complications of epidural steroid injection include:  Headache.  Bleeding.  Infection.  Allergic reaction to the medicines.  Damage to your nerves. The response to this procedure depends on the underlying cause of the pain and its duration. People who have long-term (chronic) pain are less likely to benefit from epidural steroids than are those people whose pain comes on strong and suddenly. BEFORE THE PROCEDURE   Ask your health care provider about changing or stopping your regular medicines. You may be advised to stop taking blood-thinning medicines a few days before the procedure.  You may be given medicines to reduce anxiety.  Arrange for someone to take you home after the procedure. PROCEDURE   You will remain awake during the procedure. You may receive medicine to make you relaxed.  You will be asked to lie on your stomach.  The injection site will be cleaned.  The injection site will be numbed with a medicine (local anesthetic).  A needle will be  injected through your skin into the epidural space.  Your health care provider will use an X-ray machine to ensure that the steroid is delivered closest to the affected nerve. You may have minimal discomfort at this time.  Once the needle is in the right position, the local anesthetic and the steroid will be injected into the epidural space.  The needle will then be removed and a bandage will be applied to the injection site. AFTER THE PROCEDURE   You may be monitored for a short time before you go home.  You may feel weakness or numbness in your arm or leg, which disappears within hours.  You may be allowed to eat, drink, and take your regular medicine.  You may have soreness at the site of the injection.   This information is not intended to replace advice given to you by your health care provider. Make sure you discuss any questions you have with your health care provider.   Document Released: 02/13/2008 Document Revised: 07/09/2013 Document Reviewed: 04/25/2013 Elsevier Interactive Patient Education 2016 [place]. Celiac Plexus Block Patient Information  Description: The celiac plexus is a group of nerves which are part of the sympathetic nervous system.  These nerves supply organs in the abdomen and pelvis.  Specific organs supplied with sensation by the celiac plexus include the stomach, liver, gallbladder, pancreas, kidneys and part of the gut.   The celiac plexus is located on both sides of the aorta at approximately the level of the first lumbar vertebral body.  The block will be performed with you lying on your abdomen with a pillow underneath.  Using direct x-ray guidance, the celiac plexus will be located on both sides of the spine.  Numbing medicine will be used to deaden the skin prior to needle insertion.  In most cases, a small amount of sedation can be given by IV prior to the numbing medicine.  Two small needles will be place near the celiac plexus and local anesthetic  and steroid will be injected.  The entire block usually last about 15-25 minutes.  Conditions which may be treated by celiac plexus block:   Acute and chronic pancreatitis  Pain from liver or pancreatic cancer  Pain from Crohn's disease  Other types of abdominal or flank pain  Preparation for the injection:   Do not eat any solid food or dairy products within 8 hours of your appointment.  You may drink clear liquids up to 3 hours before appointment.  Clear liquids include water, black coffee, juice or soda.  No milk or cream please.  You may take your regular medication, including pain medications, with a sip of water before your appointment.  Diabetics should hold regular insulin (if taken separately) and take 1/2 normal NPH dose in the morning of the procedure.  Carry some sugar containing items with you to your appointment.  A driver must accompany you and be prepared to drive you home after your procedure.  Bring all your current medications with you.  An IV may be inserted and sedation may be given at the discretion of the physician.  A blood pressure cuff, EKG, and other monitors will often be applied during the procedure.  Some patients may need to have extra oxygen administered for a short period.  You will be asked to provide medical information, including your allergies and medications, prior to the procedure.  We must know immediately if you are taking blood thinners (like Coumadin/Warfarin) or if you are allergic to IV iodine contrast (dye).  We must know if you could possible be pregnant.  Possible side-effects:   Bleeding from needle site or deeper  Infection (rarre, can require surgery)  Nerve injury (rare)  Numbness & tingling (temporary)  Collapsed lung (rare)  Spinal headache ( a headache worse with upright posture)  Light-headedness (temporary)  Pain at injection site (several days)  Decreased blood pressure (temporary)  Weakness in legs  (temporary)  Seizure or other drug reaction (rare)  Call if you experience:   Fever/chills associated with headache or increased back/neck pain  Headache worsened by an upright position  New onset weakness or numbness of an extremity below the injection site.  Hives or difficulty breathing (go to the emergency room)  Inflammation or drainage at the injection site.  New onset diarrhea lasting more than 2 weeks.  New symptoms which are concerning to you  Please note:  If effective, we will often do a series of 2-3 injections spaced 3-6 weeks apart to maximally decrease your pain.  If initial series is effective, you may be a candidate for a more permanent block of the celiac plexus. .  If you have questions, please call [phone] [facility]    Pain Management Discharge Instructions  General Discharge Instructions :  If you need to reach your doctor call: Monday-Friday 8:00 am - 4:00 pm at [phone] or toll free [phone].  After clinic hours [phone] to have operator reach doctor.  Bring all of your medication bottles to all your appointments in the pain clinic.  To cancel or reschedule your appointment with  Pain Management please remember to call 24 hours in advance to avoid a fee.  Refer to the educational materials which you have been given on: General Risks, I had my Procedure. Discharge Instructions, Post Sedation.  Post Procedure Instructions:  The drugs you were given will stay in your system until tomorrow, so for the next 24 hours you should not drive, make any legal decisions or drink any alcoholic beverages.  You may eat anything you prefer, but it is better to start with liquids then soups and crackers, and gradually work up to solid foods.  Please notify your doctor immediately if you have any unusual bleeding, trouble breathing or pain that is not related to your normal pain.  Depending on the type of  procedure that was done, some parts of your body may feel week and/or numb.  This usually clears up by tonight or the next day.  Walk with the use of an assistive device or accompanied by an adult for the 24 hours.  You may use ice on the affected area for the first 24 hours.  Put ice in a Ziploc bag and cover with a towel and place against area 15 minutes on 15 minutes off.  You may switch to heat after 24 hours.GENERAL RISKS AND COMPLICATIONS  What are the risk, side effects and possible complications? Generally speaking, most procedures are safe.  However, with any procedure there are risks, side effects, and the possibility of complications.  The risks and complications are dependent upon the sites that are lesioned, or the type of nerve block to be performed.  The closer the procedure is to the spine, the more serious the risks are.  Great care is taken when placing the radio frequency needles, block needles or lesioning probes, but sometimes complications can occur.  Infection: Any time there is an injection through the skin, there is a risk of infection.  This is why sterile conditions are used for these blocks.  There are four possible types of infection.  Localized skin infection.  Central Nervous System Infection-This can be in the form of Meningitis, which can be deadly.  Epidural Infections-This can be in the form of an epidural abscess, which can cause pressure inside of the spine, causing compression of the spinal cord with subsequent paralysis. This would require an emergency surgery to decompress, and there are no guarantees that the patient would recover from the paralysis.  Discitis-This is an infection of the intervertebral discs.  It occurs in about 1% of discography procedures.  It is difficult to treat and it may lead to surgery.        2. Pain: the needles have to go through skin and soft tissues, will cause soreness.       3. Damage to internal structures:  The nerves to  be lesioned may be near blood vessels or    other nerves which can be potentially damaged.       4. Bleeding: Bleeding is more common if the patient is taking blood thinners such as  aspirin, Coumadin, Ticiid, Plavix, etc., or if he/she have some genetic predisposition  such as hemophilia. Bleeding into the spinal canal can cause compression of the spinal  cord with subsequent paralysis.  This would require an emergency surgery to  decompress and there are no guarantees that the patient would recover from the  paralysis.       5. Pneumothorax:  Puncturing of a lung is a possibility, every time a needle is  introduced in  the area of the chest or upper back.  Pneumothorax refers to free air around the  collapsed lung(s), inside of the thoracic cavity (chest cavity).  Another two possible  complications related to a similar event would include: Hemothorax and Chylothorax.   These are variations of the Pneumothorax, where instead of air around the collapsed  lung(s), you may have blood or chyle, respectively.       6. Spinal headaches: They may occur with any procedures in the area of the spine.       7. Persistent CSF (Cerebro-Spinal Fluid) leakage: This is a rare problem, but may occur  with prolonged intrathecal or epidural catheters either due to the formation of a fistulous  track or a dural tear.       8. Nerve damage: By working so close to the spinal cord, there is always a possibility of  nerve damage, which could be as serious as a permanent spinal cord injury with  paralysis.       9. Death:  Although rare, severe deadly allergic reactions known as "Anaphylactic  reaction" can occur to any of the medications used.      10. Worsening of the symptoms:  We can always make thing worse.  What are the chances of something like this happening? Chances of any of this occuring are extremely low.  By statistics, you have more of a chance of getting killed in a motor vehicle accident: while driving to the  hospital than any of the above occurring .  Nevertheless, you should be aware that they are possibilities.  In general, it is similar to taking a shower.  Everybody knows that you can slip, hit your head and get killed.  Does that mean that you should not shower again?  Nevertheless always keep in mind that statistics do not mean anything if you happen to be on the wrong side of them.  Even if a procedure has a 1 (one) in a 1,000,000 (million) chance of going wrong, it you happen to be that one..Also, keep in mind that by statistics, you have more of a chance of having something go wrong when taking medications.  Who should not have this procedure? If you are on a blood thinning medication (e.g. Coumadin, Plavix, see list of "Blood Thinners"), or if you have an active infection going on, you should not have the procedure.  If you are taking any blood thinners, please inform your physician.  How should I prepare for this procedure?  Do not eat or drink anything at least six hours prior to the procedure.  Bring a driver with you .  It cannot be a taxi.  Come accompanied by an adult that can drive you back, and that is strong enough to help you if your legs get weak or numb from the local anesthetic.  Take all of your medicines the morning of the procedure with just enough water to swallow them.  If you have diabetes, make sure that you are scheduled to have your procedure done first thing in the morning, whenever possible.  If you have diabetes, take only half of your insulin dose and notify our nurse that you have done so as soon as you arrive at the clinic.  If you are diabetic, but only take blood sugar pills (oral hypoglycemic), then do not take them on the morning of your procedure.  You may take them after you have had the procedure.  Do not take  aspirin or any aspirin-containing medications, at least eleven (11) days prior to the procedure.  They may prolong bleeding.  Wear loose fitting  clothing that may be easy to take off and that you would not mind if it got stained with Betadine or blood.  Do not wear any jewelry or perfume  Remove any nail coloring.  It will interfere with some of our monitoring equipment.  NOTE: Remember that this is not meant to be interpreted as a complete list of all possible complications.  Unforeseen problems may occur.  BLOOD THINNERS The following drugs contain aspirin or other products, which can cause increased bleeding during surgery and should not be taken for 2 weeks prior to and 1 week after surgery.  If you should need take something for relief of minor pain, you may take acetaminophen which is found in Tylenol,m Datril, Anacin-3 and Panadol. It is not blood thinner. The products listed below are.  Do not take any of the products listed below in addition to any listed on your instruction sheet.  A.P.C or A.P.C with Codeine Codeine Phosphate Capsules #3 Ibuprofen Ridaura  ABC compound Congesprin Imuran rimadil  Advil Cope Indocin Robaxisal  Alka-Seltzer Effervescent Pain Reliever and Antacid Coricidin or Coricidin-D  Indomethacin Rufen  Alka-Seltzer plus Cold Medicine Cosprin Ketoprofen S-A-C Tablets  Anacin Analgesic Tablets or Capsules Coumadin Korlgesic Salflex  Anacin Extra Strength Analgesic tablets or capsules CP-2 Tablets Lanoril Salicylate  Anaprox Cuprimine Capsules Levenox Salocol  Anexsia-D Dalteparin Magan Salsalate  Anodynos Darvon compound Magnesium Salicylate Sine-off  Ansaid Dasin Capsules Magsal Sodium Salicylate  Anturane Depen Capsules Marnal Soma  APF Arthritis pain formula Dewitt's Pills Measurin Stanback  Argesic Dia-Gesic Meclofenamic Sulfinpyrazone  Arthritis Bayer Timed Release Aspirin Diclofenac Meclomen Sulindac  Arthritis pain formula Anacin Dicumarol Medipren Supac  Analgesic (Safety coated) Arthralgen Diffunasal Mefanamic Suprofen  Arthritis Strength Bufferin Dihydrocodeine Mepro Compound Suprol   Arthropan liquid Dopirydamole Methcarbomol with Aspirin Synalgos  ASA tablets/Enseals Disalcid Micrainin Tagament  Ascriptin Doan's Midol Talwin  Ascriptin A/D Dolene Mobidin Tanderil  Ascriptin Extra Strength Dolobid Moblgesic Ticlid  Ascriptin with Codeine Doloprin or Doloprin with Codeine Momentum Tolectin  Asperbuf Duoprin Mono-gesic Trendar  Aspergum Duradyne Motrin or Motrin IB Triminicin  Aspirin plain, buffered or enteric coated Durasal Myochrisine Trigesic  Aspirin Suppositories Easprin Nalfon Trillsate  Aspirin with Codeine Ecotrin Regular or Extra Strength Naprosyn Uracel  Atromid-S Efficin Naproxen Ursinus  Auranofin Capsules Elmiron Neocylate Vanquish  Axotal Emagrin Norgesic Verin  Azathioprine Empirin or Empirin with Codeine Normiflo Vitamin E  Azolid Emprazil Nuprin Voltaren  Bayer Aspirin plain, buffered or children's or timed BC Tablets or powders Encaprin Orgaran Warfarin Sodium  Buff-a-Comp Enoxaparin Orudis Zorpin  Buff-a-Comp with Codeine Equegesic Os-Cal-Gesic   Buffaprin Excedrin plain, buffered or Extra Strength Oxalid   Bufferin Arthritis Strength Feldene Oxphenbutazone   Bufferin plain or Extra Strength Feldene Capsules Oxycodone with Aspirin   Bufferin with Codeine Fenoprofen Fenoprofen Pabalate or Pabalate-SF   Buffets II Flogesic Panagesic   Buffinol plain or Extra Strength Florinal or Florinal with Codeine Panwarfarin   Buf-Tabs Flurbiprofen Penicillamine   Butalbital Compound Four-way cold tablets Penicillin   Butazolidin Fragmin Pepto-Bismol   Carbenicillin Geminisyn Percodan   Carna Arthritis Reliever Geopen Persantine   Carprofen Gold's salt Persistin   Chloramphenicol Goody's Phenylbutazone   Chloromycetin Haltrain Piroxlcam   Clmetidine heparin Plaquenil   Cllnoril Hyco-pap Ponstel   Clofibrate Hydroxy chloroquine Propoxyphen         Before stopping any of these  medications, be sure to consult the physician who ordered them.  Some, such as  Coumadin (Warfarin) are ordered to prevent or treat serious conditions such as "deep thrombosis", "pumonary embolisms", and other heart problems.  The amount of time that you may need off of the medication may also vary with the medication and the reason for which you were taking it.  If you are taking any of these medications, please make sure you notify your pain physician before you undergo any procedures.         Epidural Steroid Injection An epidural steroid injection is given to relieve pain in your neck, back, or legs that is caused by the irritation or swelling of a nerve root. This procedure involves injecting a steroid and numbing medicine (anesthetic) into the epidural space. The epidural space is the space between the outer covering of your spinal cord and the bones that form your backbone (vertebra).  LET [facility] CARE PROVIDER KNOW ABOUT:   Any allergies you have.  All medicines you are taking, including vitamins, herbs, eye drops, creams, and over-the-counter medicines such as aspirin.  Previous problems you or members of your family have had with the use of anesthetics.  Any blood disorders or blood clotting disorders you have.  Previous surgeries you have had.  Medical conditions you have. RISKS AND COMPLICATIONS Generally, this is a safe procedure. However, as with any procedure, complications can occur. Possible complications of epidural steroid injection include:  Headache.  Bleeding.  Infection.  Allergic reaction to the medicines.  Damage to your nerves. The response to this procedure depends on the underlying cause of the pain and its duration. People who have long-term (chronic) pain are less likely to benefit from epidural steroids than are those people whose pain comes on strong and suddenly. BEFORE THE PROCEDURE   Ask your health care provider about changing or stopping your regular medicines. You may be advised to stop taking blood-thinning  medicines a few days before the procedure.  You may be given medicines to reduce anxiety.  Arrange for someone to take you home after the procedure. PROCEDURE   You will remain awake during the procedure. You may receive medicine to make you relaxed.  You will be asked to lie on your stomach.  The injection site will be cleaned.  The injection site will be numbed with a medicine (local anesthetic).  A needle will be injected through your skin into the epidural space.  Your health care provider will use an X-ray machine to ensure that the steroid is delivered closest to the affected nerve. You may have minimal discomfort at this time.  Once the needle is in the right position, the local anesthetic and the steroid will be injected into the epidural space.  The needle will then be removed and a bandage will be applied to the injection site. AFTER THE PROCEDURE   You may be monitored for a short time before you go home.  You may feel weakness or numbness in your arm or leg, which disappears within hours.  You may be allowed to eat, drink, and take your regular medicine.  You may have soreness at the site of the injection.   This information is not intended to replace advice given to you by your health care provider. Make sure you discuss any questions you have with your health care provider.   Document Released: 02/13/2008 Document Revised: 07/09/2013 Document Reviewed: 04/25/2013 Elsevier Interactive Patient Education [employer].

## 2016-04-07 NOTE — Procedures (Signed)
Date of procedure:  04/07/2016  Preoperative Diagnosis:  1 chronic low back pain 2 lumbar degenerative disc disease Lumbar radiculopathy  Postoperative Diagnosis:  Same.  Procedure: 1. right Lumbar transforaminal epidural steroid injection at L4-L5 and S1. 2. Epidurogram with interpretation. 3. Fluoroscopic guidance.  Surgeon: Lance Bosch, MD  Anesthesia: MAC anesthesia by the nurse and staff under my direction  Informed consent was obtained and the patient appeared to accept and understand the risks of this procedure.  Pre procedure Comments:  None.  Description of Procedure:  The patient was taken to the operating room and placed in the prone position.  Intravenous sedation and MAC anesthesia was administered by the nurse and staff under my direction.  After appropriate sedation, the lumbosacral area was prepped with Betadine.  Then the patient was fully draped.  Fluoroscopic view of the lumbar spine was done and the pedicle on the right side was visualized.  The skin over the 6 o'clock position of the pedicle on the right was infiltrated with 3 cc of 1% Lidocaine using a 25 gauge needle.  Adequate visualization of the pedicle was obtained by using an oblique fluoroscopic view.  Then a 22 gauge 3 1/2 spinal needle was introduced through the skin and directed towards the 6 o'clock position of  The pedicles at L 4 and L5.  Then a lateral view was utilized to ensure optimal needle position and depth in the foramen.  L4 was done without any difficulty L5 was not done because every time the needle cord close to the pedicle of L5 the patient experience excruciating pain This was attempted 3 times with the same result of excruciating pain Therefore, L5 was abandoned  Epidurogram with Interpretation:  After negative aspiration, each needle was injected with 1 cc of Omnipaque 300 in the performance of independent epidurograms for the purposes of detailing the epidural anatomy  along with nerve root anatomy at the corresponding levels.  Further, the visualization of the contrast media was also to ensure adequate spread of the injectate into the presumed affected areas.  No intravascular or intrathecal spread was observed at any level and there was no corresponding paresthesias upon injection.  Epidurograms were visualized along with spread of contrast media into the exiting nerve root.  Transforaminal Epidural Steroid Injection:  Again aspirations were negative for blood, CSF or other body fluids.  Then 1.5 cc of 0.25% Bupivacaine and 25 mg of Kenalog was injected in the L4  foramen.  The right S1 foramen was visualized using an oblique fluoroscopic view.  The target area was the 10 o'clock/2 o'clock and that target area was used for local infiltration and for needle placement.  The target area was infiltrated with 3 cc of 1% Lidocaine using a 25 gauge needle.  Then a 3-inch 22 gauge needle was inserted into the right S1 foramen.  A lateral fluoroscopic view was used to confirm needle positioning and 1 cc of Omnipaque 300 was injected and a clear epidurogram with runoff of the S1 nerve root was visualized.  Bupivacaine 0.25% 1.5 cc and 60 mg of Kenalog were injected into the right S1.  The needle was removed and adequate hemostasis was established.  Additional Comments: This procedure was done using fluoroscopic control Fluoroscopic time was 1.7 minutes Number of fluoroscopic views were 4 MDY was 45.0  The patient tolerated the procedures quite well.  The needles were removed.  Adequate hemostasis was established and vital signs were stable.  The patient was taken to  the recovery room in satisfactory condition where the patient was observed and subsequently discharged home. Will follow up in the clinic in the next week or thereabout.  Lance Bosch M.D.

## 2016-04-07 NOTE — Progress Notes (Signed)
Safety precautions to be maintained throughout the outpatient stay will include: orient to surroundings, keep bed in low position, maintain call bell within reach at all times, provide assistance with transfer out of bed and ambulation.  

## 2016-04-10 ENCOUNTER — Telehealth: Payer: Self-pay | Admitting: *Deleted

## 2016-04-10 NOTE — Telephone Encounter (Signed)
No problems post procedure. 

## 2016-04-11 NOTE — Telephone Encounter (Signed)
No problems post procedure. 

## 2016-04-13 ENCOUNTER — Telehealth: Payer: Self-pay | Admitting: Family Medicine

## 2016-04-13 MED ORDER — ATORVASTATIN CALCIUM 40 MG PO TABS: 40.0000 mg | ORAL_TABLET | Freq: Every day | ORAL | Status: DC

## 2016-04-13 NOTE — Telephone Encounter (Signed)
I talked with patient about his labs; he has not been on any statins; he does not recall being offered statins; I recommended atorvastatin 40 mg and he'll start when he comes back from the beach; we'll recheck labs at f/u WBC up slightly; he has an upper respiratory infection and is on antibiotics right now; it's been mildly elevated for a while; will watch He had injection with pain clinic doctor; I read his note; withholding oral meds at this time per his comments; I am certainly willing to work with Dr. Idelia Salm if desired, but will respect his decision re: oral pain meds

## 2016-04-15 ENCOUNTER — Other Ambulatory Visit: Payer: Self-pay | Admitting: Family Medicine

## 2016-04-15 MED ORDER — SAXAGLIPTIN HCL 5 MG PO TABS: 5.0000 mg | ORAL_TABLET | Freq: Every day | ORAL | Status: DC

## 2016-04-19 ENCOUNTER — Telehealth: Payer: Self-pay | Admitting: *Deleted

## 2016-04-19 ENCOUNTER — Telehealth: Payer: Self-pay | Admitting: Family Medicine

## 2016-04-19 DIAGNOSIS — M5010 Cervical disc disorder with radiculopathy, unspecified cervical region: Secondary | ICD-10-CM

## 2016-04-19 NOTE — Telephone Encounter (Signed)
I spoke with patient; stinging sharp pain on the inside of the back of the calf and the heel; stabbed in the heel every five minutes; he lost a little bit of stool with coughing, not new, had surgery back there a few years ago; they cut him back there, did sphincterotomy; not new Increase gabapentin to 300 mg total daily; he did not need refill He has seen pain clinic before and they sent him to the ER Try turmeric; he took ibuprofen 200 mg x 4 this morning; gastritis vs ulcer I offered acupuncture / accupressure / dry needling; if his insurance will cover that, just call me for RX

## 2016-04-19 NOTE — Telephone Encounter (Signed)
Requesting return call [phone]

## 2016-04-19 NOTE — Telephone Encounter (Signed)
No vm to lvm.Marland KitchenMarland KitchenTD

## 2016-04-19 NOTE — Telephone Encounter (Signed)
PATIENT ASKING FOR YOU TO CALL HIM ABOUT THE PAIN MANAGEMENT DID A CORTIZONE SHOT HAS MADE THINGS WORSE AND IT FEELS LIKE SOMEONE IS STICKING A HOT NEEDLE IN HIS HEEL IN THE ANKLE. HE HAS CALLED THE DR THAT DID THIS AND THEY HAVE NOT RESPONDED TO HIM

## 2016-04-21 ENCOUNTER — Encounter: Payer: Self-pay | Admitting: Anesthesiology

## 2016-04-21 ENCOUNTER — Ambulatory Visit: Payer: BLUE CROSS/BLUE SHIELD | Attending: Anesthesiology | Admitting: Anesthesiology

## 2016-04-21 VITALS — BP 123/84 | HR 93 | Temp 99.2°F | Resp 18 | Ht 69.0 in | Wt 238.0 lb

## 2016-04-21 DIAGNOSIS — M47816 Spondylosis without myelopathy or radiculopathy, lumbar region: Secondary | ICD-10-CM

## 2016-04-21 DIAGNOSIS — M4726 Other spondylosis with radiculopathy, lumbar region: Secondary | ICD-10-CM

## 2016-04-21 DIAGNOSIS — G8929 Other chronic pain: Secondary | ICD-10-CM | POA: Insufficient documentation

## 2016-04-21 DIAGNOSIS — M5010 Cervical disc disorder with radiculopathy, unspecified cervical region: Secondary | ICD-10-CM

## 2016-04-21 DIAGNOSIS — M5116 Intervertebral disc disorders with radiculopathy, lumbar region: Secondary | ICD-10-CM | POA: Insufficient documentation

## 2016-04-21 DIAGNOSIS — G545 Neuralgic amyotrophy: Secondary | ICD-10-CM | POA: Insufficient documentation

## 2016-04-21 DIAGNOSIS — M545 Low back pain: Secondary | ICD-10-CM | POA: Diagnosis present

## 2016-04-21 MED ORDER — TRAMADOL HCL 50 MG PO TABS: 50.0000 mg | ORAL_TABLET | Freq: Three times a day (TID) | ORAL | Status: DC | PRN

## 2016-04-21 NOTE — Patient Instructions (Signed)
You were given a prescription for Tramadol today.

## 2016-04-21 NOTE — Progress Notes (Signed)
Safety precautions to be maintained throughout the outpatient stay will include: orient to surroundings, keep bed in low position, maintain call bell within reach at all times, provide assistance with transfer out of bed and ambulation.  

## 2016-04-25 NOTE — Progress Notes (Signed)
   Subjective:    Patient ID: Joel Page, male    DOB: [birth year], 45 y.o.   MRN: [id number]  HPI  This patient return to the clinic tod he got significant pain relief from the last intervention Today his subjective pain intensity rating is 60% He indicates that he does not want any nerve blocks He appears to be in no distress  Review of Systems  Constitutional: Negative.   HENT: Negative.   Eyes: Negative.   Respiratory: Negative.   Cardiovascular: Negative.   Gastrointestinal: Negative.   Endocrine: Negative.   Genitourinary: Negative.   Musculoskeletal: Negative.   Skin: Negative.   Allergic/Immunologic: Negative.   Neurological: Negative.   Hematological: Negative.   Psychiatric/Behavioral: Negative.        Objective:   Physical Exam  Cardiovascular:  This patient is in no distress His subjective pain intensity rating s 60% His vital signs are stable There are no new neurological nor musculo-skeletal findings  Nursing note and vitals reviewed.         Assessment & Plan:   Assessment 11 chronic low back pain 2 umbar degenerative disc disease 3 lumbar radiculopathy   Plan of management We'll continue the patient on tramadol 50 mg 3 times a day Follow-up with him in 6 weeks   Established patient        Level 2    [place].

## 2016-05-24 ENCOUNTER — Telehealth: Payer: Self-pay | Admitting: Family Medicine

## 2016-05-24 DIAGNOSIS — M5010 Cervical disc disorder with radiculopathy, unspecified cervical region: Secondary | ICD-10-CM

## 2016-05-24 DIAGNOSIS — M4716 Other spondylosis with myelopathy, lumbar region: Secondary | ICD-10-CM

## 2016-05-24 NOTE — Assessment & Plan Note (Signed)
Refer to pain clinic? 

## 2016-05-24 NOTE — Telephone Encounter (Signed)
Patient stated that his activity level keeps going down.  He wouldn't go into detail as to what the activity level was,but stated that Dr. Sanda Klein and him have discussed before and now he is wanting to do something else.  Please call patient to discuss.

## 2016-05-24 NOTE — Telephone Encounter (Signed)
I called patient He wants to find another pain management doctor He did give him a prescription for pain medicine but it's supposed to last until August He discussed radiculopathy; he does need neck surgery, but pt does not want surgery at this time I'll be glad to refer him to another pain doctor I offered referral back to surgeon and PT, he declined Be safe, don't use anything off the street

## 2016-06-21 ENCOUNTER — Telehealth: Payer: Self-pay | Admitting: Family Medicine

## 2016-06-21 NOTE — Telephone Encounter (Signed)
Sent referral to [facility] pain clinic

## 2016-06-29 ENCOUNTER — Encounter: Payer: Self-pay | Admitting: Family Medicine

## 2016-06-29 ENCOUNTER — Ambulatory Visit (INDEPENDENT_AMBULATORY_CARE_PROVIDER_SITE_OTHER): Payer: BLUE CROSS/BLUE SHIELD | Admitting: Family Medicine

## 2016-06-29 VITALS — BP 106/68 | HR 114 | Temp 98.2°F | Resp 18 | Ht 69.0 in | Wt 246.3 lb

## 2016-06-29 DIAGNOSIS — E669 Obesity, unspecified: Secondary | ICD-10-CM | POA: Insufficient documentation

## 2016-06-29 DIAGNOSIS — F17200 Nicotine dependence, unspecified, uncomplicated: Secondary | ICD-10-CM

## 2016-06-29 DIAGNOSIS — Z720 Tobacco use: Secondary | ICD-10-CM | POA: Diagnosis not present

## 2016-06-29 DIAGNOSIS — Z5181 Encounter for therapeutic drug level monitoring: Secondary | ICD-10-CM

## 2016-06-29 DIAGNOSIS — E1165 Type 2 diabetes mellitus with hyperglycemia: Secondary | ICD-10-CM

## 2016-06-29 DIAGNOSIS — IMO0001 Reserved for inherently not codable concepts without codable children: Secondary | ICD-10-CM

## 2016-06-29 DIAGNOSIS — M4716 Other spondylosis with myelopathy, lumbar region: Secondary | ICD-10-CM

## 2016-06-29 LAB — LIPID PANEL
Cholesterol: 241 mg/dL — ABNORMAL HIGH (ref 125–200)
HDL: 30 mg/dL — AB (ref >=40)
LDL CALC: 176 mg/dL — AB (ref <130)
TRIGLYCERIDES: 175 mg/dL — AB (ref <150)
Total CHOL/HDL Ratio: 8 Ratio — ABNORMAL HIGH (ref <=5.0)
VLDL: 35 mg/dL — AB (ref <30)

## 2016-06-29 LAB — CBC WITH DIFFERENTIAL/PLATELET
BASOS PCT: 0 %
Basophils Absolute: 0 cells/uL (ref 0–200)
EOS ABS: 133 {cells}/uL (ref 15–500)
EOS PCT: 1 %
HCT: 46.5 % (ref 38.5–50.0)
Hemoglobin: 15.6 g/dL (ref 13.2–17.1)
LYMPHS PCT: 17 %
Lymphs Abs: 2261 cells/uL (ref 850–3900)
MCH: 29.3 pg (ref 27.0–33.0)
MCHC: 33.5 g/dL (ref 32.0–36.0)
MCV: 87.2 fL (ref 80.0–100.0)
MONOS PCT: 5 %
MPV: 10.5 fL (ref 7.5–12.5)
Monocytes Absolute: 665 cells/uL (ref 200–950)
NEUTROS ABS: 10241 {cells}/uL — AB (ref 1500–7800)
Neutrophils Relative %: 77 %
PLATELETS: 196 10*3/uL (ref 140–400)
RBC: 5.33 MIL/uL (ref 4.20–5.80)
RDW: 14.5 % (ref 11.0–15.0)
WBC: 13.3 10*3/uL — AB (ref 3.8–10.8)

## 2016-06-29 LAB — COMPREHENSIVE METABOLIC PANEL
ALK PHOS: 87 U/L (ref 40–115)
ALT: 18 U/L (ref 9–46)
AST: 15 U/L (ref 10–40)
Albumin: 4.3 g/dL (ref 3.6–5.1)
BUN: 12 mg/dL (ref 7–25)
CO2: 26 mmol/L (ref 20–31)
CREATININE: 0.77 mg/dL (ref 0.60–1.35)
Calcium: 9.3 mg/dL (ref 8.6–10.3)
Chloride: 103 mmol/L (ref 98–110)
GLUCOSE: 192 mg/dL — AB (ref 65–99)
Potassium: 4.5 mmol/L (ref 3.5–5.3)
SODIUM: 136 mmol/L (ref 135–146)
TOTAL PROTEIN: 6.9 g/dL (ref 6.1–8.1)
Total Bilirubin: 0.4 mg/dL (ref 0.2–1.2)

## 2016-06-29 NOTE — Patient Instructions (Addendum)
I will recommend that you STOP clonazepam completely and safely discard of or destroy any remaining pills that you have; there is serious risk of harm and even death if combined with narcotics or opioids of any kind   Contact your pain clinic doctor if you are in need of pain medicine If you need something for pain, you can use Tylenol (acetaminophen) per package directions For now, do not take any non-steroidals (which include Aleve, ibuprofen, Advil, Motrin, and naproxen); excessive use of non-steroidals can cause kidney damage  I do encourage you to quit smoking Call [phone] to sign up for smoking cessation classes You can call 1-800-QUIT-NOW to talk with a smoking cessation coach  Please do see your eye doctor regularly, and have your eyes examined every year (or more often per his or her recommendation) Check your feet every night and let me know right away of any sores, infections, numbness, etc. Try to limit sweets, white bread, white rice, white potatoes It is okay with me for you to not check your fingerstick blood sugars (per [employer] Best Practices), unless you are interested and feel it would be helpful for you  Try turmeric as a natural anti-inflammatory (for pain and arthritis). It comes in capsules where you buy aspirin and fish oil, but also as a spice where you buy pepper and garlic powder.  I strongly recommend acupuncture for pain relief You can also talk with your pain clinic doctor about getting a TENS unit which may be helpful If your pain worsens or you develop bowel or bladder dysfunction, go to the emergency department  Check out the information at familydoctor.org entitled "Nutrition for Weight Loss: What You Need to Know about Fad Diets" Try to lose between 1-2 pounds per week by taking in fewer calories and burning off more calories You can succeed by limiting portions, limiting foods dense in calories and fat, becoming more active, and  drinking 8 glasses of water a day (64 ounces) Don't skip meals, especially breakfast, as skipping meals may alter your metabolism Do not use over-the-counter weight loss pills or gimmicks that claim rapid weight loss A healthy BMI (or body mass index) is between 18.5 and 24.9 You can calculate your ideal BMI at the [employer] website [employer]Mon[employer].fr   Back Pain, Adult Back pain is very common in adults.The cause of back pain is rarely dangerous and the pain often gets better over time.The cause of your back pain may not be known. Some common causes of back pain include:  Strain of the muscles or ligaments supporting the spine.  Wear and tear (degeneration) of the spinal disks.  Arthritis.  Direct injury to the back. For many people, back pain may return. Since back pain is rarely dangerous, most people can learn to manage this condition on their own. HOME CARE INSTRUCTIONS Watch your back pain for any changes. The following actions may help to lessen any discomfort you are feeling:  Remain active. It is stressful on your back to sit or stand in one place for long periods of time. Do not sit, drive, or stand in one place for more than 30 minutes at a time. Take short walks on even surfaces as soon as you are able.Try to increase the length of time you walk each day.  Exercise regularly as directed by your health care provider. Exercise helps your back heal faster. It also helps avoid future injury by keeping your muscles strong and flexible.  Do not stay  in bed.Resting more than 1-2 days can delay your recovery.  Pay attention to your body when you bend and lift. The most comfortable positions are those that put less stress on your recovering back. Always use proper lifting techniques, including:  Bending your knees.  Keeping the load close to your body.  Avoiding twisting.  Find a comfortable position to sleep. Use a firm mattress  and lie on your side with your knees slightly bent. If you lie on your back, put a pillow under your knees.  Avoid feeling anxious or stressed.Stress increases muscle tension and can worsen back pain.It is important to recognize when you are anxious or stressed and learn ways to manage it, such as with exercise.  Take medicines only as directed by your health care provider. Over-the-counter medicines to reduce pain and inflammation are often the most helpful.Your health care provider may prescribe muscle relaxant drugs.These medicines help dull your pain so you can more quickly return to your normal activities and healthy exercise.  Apply ice to the injured area:  Put ice in a plastic bag.  Place a towel between your skin and the bag.  Leave the ice on for 20 minutes, 2-3 times a day for the first 2-3 days. After that, ice and heat may be alternated to reduce pain and spasms.  Maintain a healthy weight. Excess weight puts extra stress on your back and makes it difficult to maintain good posture. SEEK MEDICAL CARE IF:  You have pain that is not relieved with rest or medicine.  You have increasing pain going down into the legs or buttocks.  You have pain that does not improve in one week.  You have night pain.  You lose weight.  You have a fever or chills. SEEK IMMEDIATE MEDICAL CARE IF:   You develop new bowel or bladder control problems.  You have unusual weakness or numbness in your arms or legs.  You develop nausea or vomiting.  You develop abdominal pain.  You feel faint.   This information is not intended to replace advice given to you by your health care provider. Make sure you discuss any questions you have with your health care provider.   Document Released: 11/06/2005 Document Revised: 11/27/2014 Document Reviewed: 03/10/2014 Elsevier Interactive Patient Education 2016 Elsevier Inc.  Chronic Back Pain  When back pain lasts longer than 3 months, it is called  chronic back pain.People with chronic back pain often go through certain periods that are more intense (flare-ups).  CAUSES Chronic back pain can be caused by wear and tear (degeneration) on different structures in your back. These structures include:  The bones of your spine (vertebrae) and the joints surrounding your spinal cord and nerve roots (facets).  The strong, fibrous tissues that connect your vertebrae (ligaments). Degeneration of these structures may result in pressure on your nerves. This can lead to constant pain. HOME CARE INSTRUCTIONS  Avoid bending, heavy lifting, prolonged sitting, and activities which make the problem worse.  Take brief periods of rest throughout the day to reduce your pain. Lying down or standing usually is better than sitting while you are resting.  Take over-the-counter or prescription medicines only as directed by your caregiver. SEEK IMMEDIATE MEDICAL CARE IF:   You have weakness or numbness in one of your legs or feet.  You have trouble controlling your bladder or bowels.  You have nausea, vomiting, abdominal pain, shortness of breath, or fainting.   This information is not intended to replace advice  given to you by your health care provider. Make sure you discuss any questions you have with your health care provider.   Document Released: 12/14/2004 Document Revised: 01/29/2012 Document Reviewed: 04/26/2015 Elsevier Interactive Patient Education [employer].

## 2016-06-29 NOTE — Progress Notes (Signed)
BP 106/68 (BP Location: Left Arm, Patient Position: Sitting, Cuff Size: Large)   Pulse (!) 114   Temp 98.2 F (36.8 C) (Oral)   Resp 18   Ht 5\' 9"  (1.753 m)   Wt 246 lb 4.8 oz (111.7 kg)   SpO2 97%   BMI 36.37 kg/m    Subjective:    Patient ID: Joel Page, male    DOB: [birth year], 45 y.o.   MRN: [id number]  HPI: Joel Page is a 45 y.o. male  Chief Complaint  Patient presents with  . Medication Refill    3 month F/U   . Diabetes    Not able to afford test strips due to them costing $96.   . Cervical Disc Disorder    Patient has an appointment at [facility] Sept. 12 at 2 p.m. and would like enough medication until then  . Hyperlipidemia   Patient is here for f/u He had a flare up of the back pain; was hanging targets; went to the ER last week; they did an examination, gave him eight vicodin and twenty tramadol; he has two tramadol left He was seeing pain clinic here locally and they were doing the steroid injections; has had six in the past; he says they never have worked; not long after the injection, he has a flare up; it happened here and happened in [place] with Dr. Letta Pate He has an appt with neurosurgeon, Dr. [name], of the neck; he wants to do surgery but patient is not eager to have surgery He is having foot drop on the right side, stumble around like he has been drunk, ongoing, since flare up; was seen in ER with this; no B/B dysfunction He says his last MRI showed disc dessication and bilateral bulges which were like ruptures He has to go back to work for financial reasons and needs me to release him back to no restrictions; he has been on sedentary restrictions for five years and he says I need to write him a letter saying he can go back to regular work without any restrictions He asked if I could prescribe pain medicine until he can get in to see the pain clinic Gabapentin caused chest pain Taking 1000 mg BID ibuprofen for the last two  weeks; he used to get 800 mg ibuprofen and said he has to take 5 of the 200 mg ibuprofen to get any relief (I immediately cautioned that that is TOO much ibuprofen at one time and can be harmful, can cause kidney damage, and not to take another NSAID period until I check his kidneys)  He is still on clonazepam; the last prescription was from February; he says he still has 20 or 30 left; the only time he takes them is when he can't sleep period  Type 2 diabetes mellitus; not checking sugars, strips are too expensive; he is taking metformin and onglyza; he has been drinking water; avoiding aspartame, avoiding soft drinks; wheat bread; avoid white potatoes and french fries; last eye exam was a year ago  High cholesterol; on atorvastatin, no hx of hepatitis or liver problems; he has trouble remembering to take regularly, just half the time, 3 eggs every 2 weeks; does eat bacon and sausage; loves broccoli and carrots and peas and cauliflower  He continues to smoke  Depression screen [facility] 2/9 06/29/2016 04/21/2016 04/07/2016 03/31/2016 03/30/2016  Decreased Interest 0 0 0 0 0  Down, Depressed, Hopeless 0 0 0 0 0  PHQ -  2 Score 0 0 0 0 0   Relevant past medical, surgical, family and social history reviewed Past Medical History:  Diagnosis Date  . Allergy   . DDD (degenerative disc disease)   . Diabetes mellitus   . Insomnia   . Lumbar stenosis   . 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 EXCISIONAL HEMORRHOIDECTOMY  2012  . ORIF FEMUR FRACTURE  1989  . SPHINCTEROTOMY    . TOOTH EXTRACTION     Family History  Problem Relation Age of Onset  . Lung cancer Mother   . Dementia Father   . COPD Father    Social History  Substance Use Topics  . Smoking status: Current Some Day Smoker    Packs/day: 1.00    Types: Cigarettes  . Smokeless tobacco: Former Systems developer  . Alcohol use 0.0 oz/week   Interim medical history since  last visit reviewed. Allergies and medications reviewed  Review of Systems Per HPI unless specifically indicated above     Objective:    BP 106/68 (BP Location: Left Arm, Patient Position: Sitting, Cuff Size: Large)   Pulse (!) 114   Temp 98.2 F (36.8 C) (Oral)   Resp 18   Ht 5\' 9"  (1.753 m)   Wt 246 lb 4.8 oz (111.7 kg)   SpO2 97%   BMI 36.37 kg/m   Wt Readings from Last 3 Encounters:  06/29/16 246 lb 4.8 oz (111.7 kg)  04/21/16 238 lb (108 kg)  04/07/16 240 lb (108.9 kg)    Physical Exam  Constitutional: He appears well-developed and well-nourished. No distress.  Obese; weight gain of 8 pounds over last 2+ months  HENT:  Head: Normocephalic and atraumatic.  Mouth/Throat: Oropharynx is clear and moist and mucous membranes are normal.  Eyes: EOM are normal. No scleral icterus.  Neck: No thyromegaly present.  Cardiovascular: Normal rate and regular rhythm.   Heart rate was under 100 during auscultation  Pulmonary/Chest: Effort normal and breath sounds normal.  Abdominal: Soft. Bowel sounds are normal. He exhibits no distension. There is no tenderness.  Musculoskeletal: He exhibits no edema.  Somewhat antalgic gait; slow to get up from chair to have labs drawn; ambulatory without assitance; able to lean down to put shoes on  Neurological: He is alert. Coordination normal.  Skin: Skin is warm and dry. No pallor.  Psychiatric: He has a normal mood and affect. His behavior is normal. Judgment and thought content normal. His mood appears not anxious. He does not exhibit a depressed mood.  He seemed quite focused on pain medicine and brought it up several times during the visit   Diabetic Foot Form - Detailed   Diabetic Foot Exam - detailed Diabetic Foot exam was performed with the following findings:  Yes 06/29/2016  4:30 PM  Visual Foot Exam completed.:  Yes  Are the toenails ingrown?:  No Normal Range of Motion:  Yes Pulse Foot Exam completed.:  Yes  Right Dorsalis  Pedis:  Present Left Dorsalis Pedis:  Present  Sensory Foot Exam Completed.:  Yes Swelling:  No Semmes-Weinstein Monofilament Test R Site 1-Great Toe:  Pos L Site 1-Great Toe:  Pos  R Site 4:  Neg L Site 4:  Neg  R Site 5:  Neg L Site 5:  Neg    Comments:  Reduced plantar surface       Assessment & Plan:   Problem List Items Addressed  This Visit      Endocrine   Diabetes mellitus type 2, uncontrolled ([place])    Check A1c today; avoid "whites" (potatoes, bread, sugar, rice) and work on weight loss; foot exam by MD today; some neuropathy noted, with no evidence of sores or infection; keep close eye on feet      Relevant Orders   Hemoglobin A1c (Completed)   Lipid panel (Completed)     Musculoskeletal and Integument   Degenerative joint disease (DJD) of lumbar spine    Patient was referred to the pain clinic and has current doctor there; patient voiced disappointed and displeasure in the small number of pills that the current pain clinic doctor provided; I explained that I am not going to write any pain medicine such as a narcotic, and advised him that any requests for pain medicine should be directed to his pain clinic doctor; he also asked me to write him a full release to return to work without restrictions, which I explained I will not do if he has just spent five years being on sedentary restrictions; I am not understanding the rationale of him reporting a "flare" of his back pain and needing escalating pain medicine, while at the same time asking me to write him a note saying his back has improved to the point where he no longer needs or warrants restrictions; he may direct any work notes to his back doctor; I encouraged him to consider acupuncture, turmeric, ice/heat; he can use NSAID once we get his creatinine back and verify his 1000 mg ibuprofen doses have not caused declined in GFR        Other   Obesity    Work on weight loss; explained to patient briefly the physics of  obesity on discs in the lower back; excessive weight creating pressure on the lower discs; see AVS      Medication monitoring encounter    Check sgpt      Relevant Orders   CBC with Differential/Platelet (Completed)   Comprehensive Metabolic Panel (CMET) (Completed)   Current smoker    Advised patient to quit smoking; explained that smoking cessation may actually improve his pain level and healing abilities       Other Visit Diagnoses   None.      Follow up plan: No Follow-up on file.  An after-visit summary was printed and given to the patient at [facility].  Please see the patient instructions which may contain other information and recommendations beyond what is mentioned above in the assessment and plan.  No orders of the defined types were placed in this encounter.   Orders Placed This Encounter  Procedures  . CBC with Differential/Platelet  . Comprehensive Metabolic Panel (CMET)  . Hemoglobin A1c  . Lipid panel

## 2016-06-29 NOTE — Assessment & Plan Note (Signed)
Check sgpt 

## 2016-06-29 NOTE — Assessment & Plan Note (Addendum)
Work on weight loss; explained to patient briefly the physics of obesity on discs in the lower back; excessive weight creating pressure on the lower discs; see AVS

## 2016-06-29 NOTE — Assessment & Plan Note (Addendum)
Advised patient to quit smoking; explained that smoking cessation may actually improve his pain level and healing abilities

## 2016-06-29 NOTE — Assessment & Plan Note (Addendum)
Check A1c today; avoid "whites" (potatoes, bread, sugar, rice) and work on weight loss; foot exam by MD today; some neuropathy noted, with no evidence of sores or infection; keep close eye on feet

## 2016-06-30 ENCOUNTER — Ambulatory Visit: Payer: BLUE CROSS/BLUE SHIELD | Admitting: Family Medicine

## 2016-06-30 LAB — HEMOGLOBIN A1C
HEMOGLOBIN A1C: 8.8 % — AB (ref <5.7)
Mean Plasma Glucose: 206 mg/dL

## 2016-06-30 NOTE — Assessment & Plan Note (Addendum)
Patient was referred to the pain clinic and has current doctor there; patient voiced disappointed and displeasure in the small number of pills that the current pain clinic doctor provided; I explained that I am not going to write any pain medicine such as a narcotic, and advised him that any requests for pain medicine should be directed to his pain clinic doctor; he also asked me to write him a full release to return to work without restrictions, which I explained I will not do if he has just spent five years being on sedentary restrictions; I am not understanding the rationale of him reporting a "flare" of his back pain and needing escalating pain medicine, while at the same time asking me to write him a note saying his back has improved to the point where he no longer needs or warrants restrictions; he may direct any work notes to his back doctor; I encouraged him to consider acupuncture, turmeric, ice/heat; he can use NSAID once we get his creatinine back and verify his 1000 mg ibuprofen doses have not caused declined in GFR

## 2016-07-04 ENCOUNTER — Other Ambulatory Visit: Payer: Self-pay | Admitting: Family Medicine

## 2016-07-04 DIAGNOSIS — E1165 Type 2 diabetes mellitus with hyperglycemia: Principal | ICD-10-CM

## 2016-07-04 DIAGNOSIS — Z5181 Encounter for therapeutic drug level monitoring: Secondary | ICD-10-CM

## 2016-07-04 DIAGNOSIS — IMO0001 Reserved for inherently not codable concepts without codable children: Secondary | ICD-10-CM

## 2016-07-04 DIAGNOSIS — E785 Hyperlipidemia, unspecified: Secondary | ICD-10-CM | POA: Insufficient documentation

## 2016-07-04 MED ORDER — EMPAGLIFLOZIN 10 MG PO TABS: 10.0000 mg | ORAL_TABLET | Freq: Every day | ORAL | 2 refills | Status: DC

## 2016-07-04 MED ORDER — METFORMIN HCL 500 MG PO TABS: 1000.0000 mg | ORAL_TABLET | Freq: Two times a day (BID) | ORAL | 2 refills | Status: DC

## 2016-07-04 MED ORDER — ATORVASTATIN CALCIUM 80 MG PO TABS: 80.0000 mg | ORAL_TABLET | Freq: Every day | ORAL | 1 refills | Status: DC

## 2016-07-04 NOTE — Assessment & Plan Note (Signed)
Check sgpt in 6 weeks 

## 2016-07-04 NOTE — Progress Notes (Signed)
Lipids uncontrolled; increase statin from 40 mg to 80 mg; recheck lipids and sgpt in 6 weeks A1c uncontrolled; increase metformin, add SGLT-2 inhibitor

## 2016-07-04 NOTE — Assessment & Plan Note (Signed)
Increase statin, recheck lipids in 6 weeks 

## 2016-07-28 ENCOUNTER — Encounter: Payer: Self-pay | Admitting: Emergency Medicine

## 2016-07-28 ENCOUNTER — Emergency Department
Admission: EM | Admit: 2016-07-28 | Discharge: 2016-07-28 | Disposition: A | Discharge status: Home / Self Care | Payer: BLUE CROSS/BLUE SHIELD | Attending: Emergency Medicine | Admitting: Emergency Medicine

## 2016-07-28 ENCOUNTER — Emergency Department: Payer: BLUE CROSS/BLUE SHIELD

## 2016-07-28 DIAGNOSIS — Z7984 Long term (current) use of oral hypoglycemic drugs: Secondary | ICD-10-CM | POA: Diagnosis not present

## 2016-07-28 DIAGNOSIS — J40 Bronchitis, not specified as acute or chronic: Secondary | ICD-10-CM

## 2016-07-28 DIAGNOSIS — F17210 Nicotine dependence, cigarettes, uncomplicated: Secondary | ICD-10-CM | POA: Diagnosis not present

## 2016-07-28 DIAGNOSIS — R42 Dizziness and giddiness: Secondary | ICD-10-CM | POA: Diagnosis present

## 2016-07-28 DIAGNOSIS — J449 Chronic obstructive pulmonary disease, unspecified: Secondary | ICD-10-CM | POA: Diagnosis not present

## 2016-07-28 DIAGNOSIS — Z79899 Other long term (current) drug therapy: Secondary | ICD-10-CM | POA: Insufficient documentation

## 2016-07-28 DIAGNOSIS — E119 Type 2 diabetes mellitus without complications: Secondary | ICD-10-CM | POA: Insufficient documentation

## 2016-07-28 DIAGNOSIS — Z5181 Encounter for therapeutic drug level monitoring: Secondary | ICD-10-CM | POA: Diagnosis not present

## 2016-07-28 LAB — COMPREHENSIVE METABOLIC PANEL
ALBUMIN: 4 g/dL (ref 3.5–5.0)
ALK PHOS: 74 U/L (ref 38–126)
ALT: 18 U/L (ref 17–63)
AST: 20 U/L (ref 15–41)
Anion gap: 7 (ref 5–15)
BILIRUBIN TOTAL: 0.4 mg/dL (ref 0.3–1.2)
BUN: 19 mg/dL (ref 6–20)
CALCIUM: 8.5 mg/dL — AB (ref 8.9–10.3)
CO2: 24 mmol/L (ref 22–32)
CREATININE: 1.05 mg/dL (ref 0.61–1.24)
Chloride: 106 mmol/L (ref 101–111)
GFR calc Af Amer: >60 mL/min (ref >60)
GLUCOSE: 195 mg/dL — AB (ref 65–99)
Potassium: 3.7 mmol/L (ref 3.5–5.1)
Sodium: 137 mmol/L (ref 135–145)
TOTAL PROTEIN: 7.3 g/dL (ref 6.5–8.1)

## 2016-07-28 LAB — PROTIME-INR
INR: 0.92
Prothrombin Time: 12.4 seconds (ref 11.4–15.2)

## 2016-07-28 LAB — DIFFERENTIAL
BASOS ABS: 0.1 10*3/uL (ref 0–0.1)
Basophils Relative: 1 %
Eosinophils Absolute: 0.2 10*3/uL (ref 0–0.7)
Eosinophils Relative: 1 %
LYMPHS ABS: 2.8 10*3/uL (ref 1.0–3.6)
LYMPHS PCT: 23 %
MONOS PCT: 6 %
Monocytes Absolute: 0.7 10*3/uL (ref 0.2–1.0)
NEUTROS PCT: 69 %
Neutro Abs: 8.7 10*3/uL — ABNORMAL HIGH (ref 1.4–6.5)

## 2016-07-28 LAB — TROPONIN I

## 2016-07-28 LAB — CBC
HEMATOCRIT: 44.4 % (ref 40.0–52.0)
HEMOGLOBIN: 15.1 g/dL (ref 13.0–18.0)
MCH: 29.4 pg (ref 26.0–34.0)
MCHC: 34.1 g/dL (ref 32.0–36.0)
MCV: 86.4 fL (ref 80.0–100.0)
Platelets: 183 10*3/uL (ref 150–440)
RBC: 5.14 MIL/uL (ref 4.40–5.90)
RDW: 14.8 % — ABNORMAL HIGH (ref 11.5–14.5)
WBC: 12.5 10*3/uL — ABNORMAL HIGH (ref 3.8–10.6)

## 2016-07-28 LAB — APTT: APTT: 36 s (ref 24–36)

## 2016-07-28 MED ORDER — BENZONATATE 100 MG PO CAPS: 100.0000 mg | ORAL_CAPSULE | Freq: Three times a day (TID) | ORAL | 0 refills | Status: DC | PRN

## 2016-07-28 MED ORDER — AZITHROMYCIN 500 MG PO TABS: 500.0000 mg | ORAL_TABLET | Freq: Every day | ORAL | 0 refills | Status: AC

## 2016-07-28 NOTE — ED Provider Notes (Signed)
[facility] Emergency Department Provider Note  Time seen: 3:15 AM  I have reviewed the triage vital signs and the nursing notes.   HISTORY  Chief Complaint Dizziness   HPI Joel Page is a 45 y.o. male presents with acute onset of diaphoresis, feeling cold, dizziness and left arm numbness one hour before presentation. Patient admits to three-day history of nonproductive cough and congestion. Patient admits to a one pack-a-day cigarette use. Patient denies any cardiac history. Is any lower extremity pain or swelling. Patient denies any history of DVT or PE. Of note patient's father had a myocardial infarction in his "55s".   Past Medical History:  Diagnosis Date  . Allergy   . DDD (degenerative disc disease)   . Diabetes mellitus   . Insomnia   . Lumbar stenosis   . 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 Hyperlipidemia LDL goal <100 07/04/2016  . Obesity 06/29/2016  . Back pain at L4-L5 level 04/07/2016  . Cough 03/30/2016  . Medication monitoring encounter 03/30/2016  . Long term prescription benzodiazepine use 03/12/2016  . COPD suggested by initial evaluation ([place]) 12/22/2015  . 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  . Diabetes mellitus type 2, uncontrolled ([place]) 05/11/2015  . Current smoker 05/11/2015  . Hemorrhoids, internal 05/11/2015  . Arthralgia of hip 05/11/2015  . Adiposity 05/11/2015  . 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  . Restless leg 05/11/2015  . Trochanteric bursitis of right hip 06/17/2013  . Sciatica 03/19/2012  . Lumbar degenerative disc disease 03/19/2012    Past Surgical History:  Procedure Laterality Date  . EXCISIONAL HEMORRHOIDECTOMY  2012  . ORIF FEMUR FRACTURE  1989  . SPHINCTEROTOMY    . TOOTH EXTRACTION      Prior to Admission medications   Medication Sig Start Date End Date  Taking? Authorizing Provider  albuterol (PROVENTIL HFA) 108 (90 Base) MCG/ACT inhaler Inhale 1-2 puffs into the lungs every 6 (six) hours as needed. 12/22/15  Yes Bobetta Lime, MD  atorvastatin (LIPITOR) 80 MG tablet Take 1 tablet (80 mg total) by mouth at bedtime. 07/04/16  Yes Arnetha Courser, MD  cyclobenzaprine (FLEXERIL) 10 MG tablet Take 1 tablet (10 mg total) by mouth 3 (three) times daily as needed. 12/22/15  Yes Bobetta Lime, MD  empagliflozin (JARDIANCE) 10 MG TABS tablet Take 10 mg by mouth daily. 07/04/16  Yes Arnetha Courser, MD  gabapentin (NEURONTIN) 100 MG capsule Take 100 mg by mouth at bedtime.   Yes Historical Provider, MD  metFORMIN (GLUCOPHAGE) 500 MG tablet Take 2 tablets (1,000 mg total) by mouth 2 (two) times daily with a meal. 07/04/16  Yes Arnetha Courser, MD  Blood Glucose Monitoring Suppl (BAYER CONTOUR NEXT MONITOR) w/Device KIT 1 Device by Does not apply route 1 day or 1 dose. DX: DM E11.5 12/22/15   Bobetta Lime, MD  clonazePAM (KLONOPIN) 1 MG tablet Take 1 tablet (1 mg total) by mouth at bedtime. Patient not taking: Reported on 07/28/2016 12/22/15   Bobetta Lime, MD  saxagliptin HCl (ONGLYZA) 5 MG TABS tablet Take 1 tablet (5 mg total) by mouth daily. This takes the place of Tradjenta (not covered by insurance) Patient not taking: Reported on 07/28/2016 04/15/16   Arnetha Courser, MD  traMADol (ULTRAM) 50 MG tablet Take 1 tablet (50 mg total) by mouth 3 (three) times daily with  meals as needed. Patient not taking: Reported on 07/28/2016 04/21/16   Lance Bosch, MD    Allergies Topiramate; Trazodone; Baclofen; and Diclofenac sodium  Family History  Problem Relation Age of Onset  . Lung cancer Mother   . Dementia Father   . COPD Father     Social History Social History  Substance Use Topics  . Smoking status: Current Some Day Smoker    Packs/day: 1.00    Types: Cigarettes  . Smokeless tobacco: Former Systems developer  . Alcohol use 0.0 oz/week    Review of  Systems Constitutional: Positive for chills Eyes: No visual changes. ENT: No sore throat. Cardiovascular: Positive for chest pain. Respiratory: Denies shortness of breath. Gastrointestinal: No abdominal pain.  No nausea, no vomiting.  No diarrhea.  No constipation. Genitourinary: Negative for dysuria. Musculoskeletal: Negative for back pain. Skin: Negative for rash. Neurological: Negative for headaches, focal weakness or numbness.  10-point ROS otherwise negative.  ____________________________________________   PHYSICAL EXAM:  VITAL SIGNS: ED Triage Vitals  Enc Vitals Group     BP 07/28/16 0310 133/77     Pulse Rate 07/28/16 0310 97     Resp 07/28/16 0310 15     Temp 07/28/16 0310 98 F (36.7 C)     Temp Source 07/28/16 0310 Oral     SpO2 07/28/16 0310 98 %     Weight 07/28/16 0301 246 lb (111.6 kg)     Height 07/28/16 0301 _0  (1.753 m)     Head Circumference --      Peak Flow --      Pain Score --      Pain Loc --      Pain Edu? --      Excl. in [place]? --     Constitutional: Alert and oriented. Well appearing and in no acute distress. Eyes: Conjunctivae are normal. PERRL. EOMI. Head: Atraumatic. Ears:  Healthy appearing ear canals and TMs bilaterally Nose: No congestion/rhinnorhea. Mouth/Throat: Mucous membranes are moist.  Oropharynx non-erythematous. Neck: No stridor.  No meningeal signs.   Cardiovascular: Normal rate, regular rhythm. Good peripheral circulation. Grossly normal heart sounds. Respiratory: Normal respiratory effort.  No retractions. Right lower lobe rhonchi Gastrointestinal: Soft and nontender. No distention.   Musculoskeletal: No lower extremity tenderness nor edema. No gross deformities of extremities. Neurologic:  Normal speech and language. No gross focal neurologic deficits are appreciated.  Skin:  Skin is warm, dry and intact. No rash noted. Psychiatric: Mood and affect are normal. Speech and behavior are  normal.  ____________________________________________   LABS (all labs ordered are listed, but only abnormal results are displayed)  Labs Reviewed  COMPREHENSIVE METABOLIC PANEL - Abnormal; Notable for the following:       Result Value   Glucose, Bld 195 (*)    Calcium 8.5 (*)    All other components within normal limits  PROTIME-INR  APTT  TROPONIN I  CBC  DIFFERENTIAL  CBG MONITORING, ED   ____________________________________________  EKG  ED ECG REPORT I, [place] Shaliah Wann, the attending physician, personally viewed and interpreted this ECG.   Date: 07/28/2016  EKG Time: 3:07 AM  Rate: 91  Rhythm: Normal sinus rhythm  Axis: Normal  Intervals: Normal  ST&T Change: None   RADIOLOGY I, [place] Amazin Pincock, personally viewed and evaluated these images (plain radiographs) as part of my medical decision making, as well as reviewing the written report by the radiologist.  Dg Chest Portable 1 View  Result Date: 07/28/2016 CLINICAL DATA:  45 year old male with cough and dyspnea. EXAM: PORTABLE CHEST 1 VIEW COMPARISON:  Chest radiograph dated 03/30/2016 FINDINGS: The heart size and mediastinal contours are within normal limits. Both lungs are clear. The visualized skeletal structures are unremarkable. IMPRESSION: No active disease. Electronically Signed   By: Anner Crete M.D.   On: 07/28/2016 04:08   Ct Head Code Stroke W/o Cm  Result Date: 07/28/2016 CLINICAL DATA:  Code stroke.  Left arm numbness for 1 hour EXAM: CT HEAD WITHOUT CONTRAST TECHNIQUE: Contiguous axial images were obtained from the base of the skull through the vertex without intravenous contrast. COMPARISON:  Head CT 06/27/2013 FINDINGS: Brain: No mass lesion, intraparenchymal hemorrhage or extra-axial collection. No evidence of acute cortical infarct. Brain parenchyma and CSF-containing spaces are normal for age. Vascular: No hyperdense vessel or atherosclerotic calcification. Skull: Normal visualized skull base,  calvarium and extracranial soft tissues. Sinuses/Orbits: No sinus fluid levels or advanced mucosal thickening. No mastoid effusion. Normal orbits. ASPECTS [facility] Stroke Program Early CT Score) - Ganglionic level infarction (caudate, lentiform nuclei, internal capsule, insula, M1-M3 cortex): 7 - Supraganglionic infarction (M4-M6 cortex): 3 Total score (0-10 with 10 being normal): 10 IMPRESSION: 1. Normal head CT. 2. ASPECTS is 10. These results were called by telephone at the time of interpretation on 07/28/2016 at 3:51 am to Dr. Marjean Donna , who verbally acknowledged these results. Electronically Signed   By: Ulyses Jarred M.D.   On: 07/28/2016 03:51    ____________________________________________   PROCEDURES  Procedure(s) performed:   Procedures    INITIAL IMPRESSION / ASSESSMENT AND PLAN / ED COURSE  Pertinent labs & imaging results that were available during my care of the patient were reviewed by me and considered in my medical decision making (see chart for details).  EKG revealed no ST segment elevation or depression. Initial troponin negative, patient refused repeat troponin level. Patient requesting discharge stating that he feels better.   Clinical Course    ____________________________________________  FINAL CLINICAL IMPRESSION(S) / ED DIAGNOSES  Final diagnoses:  Bronchitis     MEDICATIONS GIVEN DURING THIS VISIT:  Medications - No data to display   NEW OUTPATIENT MEDICATIONS STARTED DURING THIS VISIT:  New Prescriptions   No medications on file    Modified Medications   No medications on file    Discontinued Medications   BUPROPION (WELLBUTRIN SR) 150 MG 12 HR TABLET    Take 1 tablet by mouth 2 (two) times daily. Reported on 03/31/2016     Note:  This document was prepared using Dragon voice recognition software and may include unintentional dictation errors.    Gregor Hams, MD 07/29/16 [phone]

## 2016-07-28 NOTE — ED Notes (Signed)
EDP at bedside, pt refused 2nd trop, EDP aware

## 2016-07-28 NOTE — ED Triage Notes (Signed)
Patient ambulatory to triage with steady gait, without difficulty or distress noted; pt reports diaphoresis, dizziness, left arm numbness x hour

## 2016-08-15 DIAGNOSIS — M4802 Spinal stenosis, cervical region: Secondary | ICD-10-CM | POA: Insufficient documentation

## 2016-08-21 ENCOUNTER — Ambulatory Visit: Payer: Self-pay | Admitting: Anesthesiology

## 2017-02-09 ENCOUNTER — Encounter: Payer: Self-pay | Admitting: Emergency Medicine

## 2017-02-09 ENCOUNTER — Emergency Department
Admission: EM | Admit: 2017-02-09 | Discharge: 2017-02-09 | Disposition: A | Discharge status: Home / Self Care | Payer: BLUE CROSS/BLUE SHIELD | Attending: Emergency Medicine | Admitting: Emergency Medicine

## 2017-02-09 DIAGNOSIS — J449 Chronic obstructive pulmonary disease, unspecified: Secondary | ICD-10-CM | POA: Insufficient documentation

## 2017-02-09 DIAGNOSIS — Z7984 Long term (current) use of oral hypoglycemic drugs: Secondary | ICD-10-CM | POA: Insufficient documentation

## 2017-02-09 DIAGNOSIS — E1165 Type 2 diabetes mellitus with hyperglycemia: Secondary | ICD-10-CM

## 2017-02-09 DIAGNOSIS — Z9119 Patient's noncompliance with other medical treatment and regimen: Secondary | ICD-10-CM | POA: Insufficient documentation

## 2017-02-09 DIAGNOSIS — Z79899 Other long term (current) drug therapy: Secondary | ICD-10-CM | POA: Insufficient documentation

## 2017-02-09 DIAGNOSIS — F17210 Nicotine dependence, cigarettes, uncomplicated: Secondary | ICD-10-CM | POA: Insufficient documentation

## 2017-02-09 DIAGNOSIS — M545 Low back pain, unspecified: Secondary | ICD-10-CM

## 2017-02-09 DIAGNOSIS — Z9114 Patient's other noncompliance with medication regimen: Secondary | ICD-10-CM

## 2017-02-09 DIAGNOSIS — E119 Type 2 diabetes mellitus without complications: Secondary | ICD-10-CM | POA: Insufficient documentation

## 2017-02-09 DIAGNOSIS — IMO0001 Reserved for inherently not codable concepts without codable children: Secondary | ICD-10-CM

## 2017-02-09 LAB — CBC
HCT: 42.5 % (ref 40.0–52.0)
Hemoglobin: 14.4 g/dL (ref 13.0–18.0)
MCH: 28.6 pg (ref 26.0–34.0)
MCHC: 33.9 g/dL (ref 32.0–36.0)
MCV: 84.2 fL (ref 80.0–100.0)
Platelets: 176 K/uL (ref 150–440)
RBC: 5.05 MIL/uL (ref 4.40–5.90)
RDW: 15.4 % — ABNORMAL HIGH (ref 11.5–14.5)
WBC: 10.1 K/uL (ref 3.8–10.6)

## 2017-02-09 LAB — BASIC METABOLIC PANEL
Anion gap: 7 (ref 5–15)
BUN: 15 mg/dL (ref 6–20)
CHLORIDE: 104 mmol/L (ref 101–111)
CO2: 25 mmol/L (ref 22–32)
Calcium: 8.8 mg/dL — ABNORMAL LOW (ref 8.9–10.3)
Creatinine, Ser: 0.82 mg/dL (ref 0.61–1.24)
GFR calc Af Amer: >60 mL/min (ref >60)
GFR calc non Af Amer: >60 mL/min (ref >60)
Glucose, Bld: 261 mg/dL — ABNORMAL HIGH (ref 65–99)
POTASSIUM: 4.3 mmol/L (ref 3.5–5.1)
SODIUM: 136 mmol/L (ref 135–145)

## 2017-02-09 LAB — URINALYSIS, COMPLETE (UACMP) WITH MICROSCOPIC
Bacteria, UA: NONE SEEN
Bilirubin Urine: NEGATIVE
Glucose, UA: 50 mg/dL — AB
Hgb urine dipstick: NEGATIVE
Ketones, ur: NEGATIVE mg/dL
Leukocytes, UA: NEGATIVE
Nitrite: NEGATIVE
Protein, ur: NEGATIVE mg/dL
Specific Gravity, Urine: 1.016 (ref 1.005–1.030)
Squamous Epithelial / HPF: NONE SEEN
pH: 6 (ref 5.0–8.0)

## 2017-02-09 MED ORDER — METHOCARBAMOL 500 MG PO TABS: 500.0000 mg | ORAL_TABLET | Freq: Four times a day (QID) | ORAL | 0 refills | Status: DC | PRN

## 2017-02-09 MED ORDER — TRAMADOL HCL 50 MG PO TABS: 50.0000 mg | ORAL_TABLET | Freq: Four times a day (QID) | ORAL | 0 refills | Status: DC | PRN

## 2017-02-09 MED ORDER — IBUPROFEN 600 MG PO TABS: 600.0000 mg | ORAL_TABLET | Freq: Three times a day (TID) | ORAL | 0 refills | Status: DC | PRN

## 2017-02-09 NOTE — ED Notes (Signed)
Pt c/o lower back pain x1wk, States foul odor to urine and urine is darker than normal. Denies any dysuria or fever

## 2017-02-09 NOTE — ED Provider Notes (Signed)
[facility] Emergency Department Provider Note  ____________________________________________   First MD Initiated Contact with Patient 02/09/17 1152     (approximate)  I have reviewed the triage vital signs and the nursing notes.   HISTORY  Chief Complaint Back Pain    HPI Joel Page is a 46 y.o. male is here today with complaint of back pain for last week. Patient states that he is unaware of any injury. He states he has a history of back pain and spinal stenosis. He states that this is feeling much different than his normal back pain. He is unaware of any injuries. He has not been taking any over-the-counter medication for his pain. He also complains that his urine is dark and cloudy in appearance. He denies any previous urinary tract infections or kidney stones. Patient was seeing a pain management doctor for his chronic back pain but discontinued seeing him several months ago. Currently he is not taking any narcotics for his back pain. He denies any incontinence of bowel or bladder. He denies any prostate problems. He denies any There is no paresthesias to use to ambulate without assistance.   Past Medical History:  Diagnosis Date  . Allergy   . DDD (degenerative disc disease)   . Diabetes mellitus   . Insomnia   . Lumbar stenosis   . 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 Hyperlipidemia LDL goal <100 07/04/2016  . Obesity 06/29/2016  . Back pain at L4-L5 level 04/07/2016  . Cough 03/30/2016  . Medication monitoring encounter 03/30/2016  . Long term prescription benzodiazepine use 03/12/2016  . COPD suggested by initial evaluation ([place]) 12/22/2015  . 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  . Diabetes mellitus type 2, uncontrolled ([place]) 05/11/2015  . Current smoker 05/11/2015  . Hemorrhoids, internal 05/11/2015  . Arthralgia of hip  05/11/2015  . Adiposity 05/11/2015  . 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  . Restless leg 05/11/2015  . Trochanteric bursitis of right hip 06/17/2013  . Sciatica 03/19/2012  . Lumbar degenerative disc disease 03/19/2012    Past Surgical History:  Procedure Laterality Date  . EXCISIONAL HEMORRHOIDECTOMY  2012  . ORIF FEMUR FRACTURE  1989  . SPHINCTEROTOMY    . TOOTH EXTRACTION      Prior to Admission medications   Medication Sig Start Date End Date Taking? Authorizing Provider  albuterol (PROVENTIL HFA) 108 (90 Base) MCG/ACT inhaler Inhale 1-2 puffs into the lungs every 6 (six) hours as needed. 12/22/15   Bobetta Lime, MD  atorvastatin (LIPITOR) 80 MG tablet Take 1 tablet (80 mg total) by mouth at bedtime. 07/04/16   Arnetha Courser, MD  Blood Glucose Monitoring Suppl (BAYER CONTOUR NEXT MONITOR) w/Device KIT 1 Device by Does not apply route 1 day or 1 dose. DX: DM E11.5 12/22/15   Bobetta Lime, MD  gabapentin (NEURONTIN) 100 MG capsule Take 100 mg by mouth at bedtime.    Historical Provider, MD  ibuprofen (ADVIL,MOTRIN) 600 MG tablet Take 1 tablet (600 mg total) by mouth every 8 (eight) hours as needed. 02/09/17   Johnn Hai, PA-C  metFORMIN (GLUCOPHAGE) 500 MG tablet Take 2 tablets (1,000 mg total) by mouth 2 (two) times daily with a meal. 07/04/16   Arnetha Courser, MD  methocarbamol (ROBAXIN) 500 MG tablet Take 1 tablet (500 mg total) by mouth every 6 (six) hours  as needed for muscle spasms. 02/09/17   Tommi Rumps, PA-C  traMADol (ULTRAM) 50 MG tablet Take 1 tablet (50 mg total) by mouth every 6 (six) hours as needed. 02/09/17   Tommi Rumps, PA-C    Allergies Topiramate; Trazodone; Baclofen; and Diclofenac sodium  Family History  Problem Relation Age of Onset  . Lung cancer Mother   . Dementia Father   . COPD Father     Social History Social History  Substance Use Topics  . Smoking status: Current Some Day Smoker    Packs/day: 1.00     Types: Cigarettes  . Smokeless tobacco: Former Neurosurgeon  . Alcohol use 0.0 oz/week    Review of Systems Constitutional: No fever/chills Cardiovascular: Denies chest pain. Respiratory: Denies shortness of breath. Gastrointestinal: No abdominal pain.  No nausea, no vomiting.   Genitourinary: Negative for dysuria. Musculoskeletal: Positive for both acute and chronic back pain. Skin: Negative for rash. Neurological: Negative for headaches, focal weakness or numbness. Psychiatric: Endocrine:Positive diabetes type 2.   10-point ROS otherwise negative.  ____________________________________________   PHYSICAL EXAM:  VITAL SIGNS: ED Triage Vitals  Enc Vitals Group     BP 02/09/17 1100 128/70     Pulse Rate 02/09/17 1100 87     Resp 02/09/17 1100 16     Temp 02/09/17 1100 98.4 F (36.9 C)     Temp Source 02/09/17 1100 Oral     SpO2 02/09/17 1100 97 %     Weight 02/09/17 1057 246 lb (111.6 kg)     Height --      Head Circumference --      Peak Flow --      Pain Score 02/09/17 1058 9     Pain Loc --      Pain Edu? --      Excl. in [place]? --     Constitutional: Alert and oriented. Well appearing and in no acute distress. Eyes: Conjunctivae are normal. PERRL. EOMI. Head: Atraumatic. Nose: No congestion/rhinnorhea. Neck: No stridor.  Cardiovascular: Normal rate, regular rhythm. Grossly normal heart sounds.  Good peripheral circulation. Respiratory: Normal respiratory effort.  No retractions. Lungs CTAB. Gastrointestinal: Soft and nontender. No distention. No CVA tenderness is appreciated. Musculoskeletal: Examination of the back there is no gross deformity noted. There is no active muscle spasm seen. Patient points to the sacral area when pointing to the area that is painful. There is no point tenderness on palpation of the vertebral bodies. There is tenderness bilateral paravertebral muscles in that area. Patient has limited range of motion secondary to discomfort. Straight leg  raises were approximately 40 bilaterally with pain in lower back. Neurologic:  Normal speech and language. No gross focal neurologic deficits are appreciated. Reflexes 1+ bilaterally. No gait instability. Skin:  Skin is warm, dry and intact. No rash noted. Psychiatric: Mood and affect are normal. Speech and behavior are normal.  ____________________________________________   LABS (all labs ordered are listed, but only abnormal results are displayed)  Labs Reviewed  URINALYSIS, COMPLETE (UACMP) WITH MICROSCOPIC - Abnormal; Notable for the following:       Result Value   Color, Urine YELLOW (*)    APPearance CLEAR (*)    Glucose, UA 50 (*)    All other components within normal limits  BASIC METABOLIC PANEL - Abnormal; Notable for the following:    Glucose, Bld 261 (*)    Calcium 8.8 (*)    All other components within normal limits  CBC - Abnormal; Notable  for the following:    RDW 15.4 (*)    All other components within normal limits    PROCEDURES  Procedure(s) performed: None  Procedures  Critical Care performed: No  ____________________________________________   INITIAL IMPRESSION / ASSESSMENT AND PLAN / ED COURSE  Pertinent labs & imaging results that were available during my care of the patient were reviewed by me and considered in my medical decision making (see chart for details).  In talking with patient patient has discontinued taking his diabetes medicine. He also has infrequently taking his gabapentin for his back pain. Patient states that he does not feel like either one was working. Patient was made aware that his blood sugar in the emergency room was 261 and that he did have glucose in his urine. Her analysis did not show any evidence suspicious for kidney stone or infection. We discussed restarting his metformin and also his gabapentin. He is to follow-up with his PCP for better control of his blood sugar and also to reevaluate his back pain.       ____________________________________________   FINAL CLINICAL IMPRESSION(S) / ED DIAGNOSES  Final diagnoses:  Acute bilateral low back pain without sciatica  Uncontrolled type 2 diabetes mellitus without complication, without long-term current use of insulin (HCC)  Non compliance w medication regimen      NEW MEDICATIONS STARTED DURING THIS VISIT:  Discharge Medication List as of 02/09/2017 12:23 PM    START taking these medications   Details  ibuprofen (ADVIL,MOTRIN) 600 MG tablet Take 1 tablet (600 mg total) by mouth every 8 (eight) hours as needed., Starting Fri 02/09/2017, Print    methocarbamol (ROBAXIN) 500 MG tablet Take 1 tablet (500 mg total) by mouth every 6 (six) hours as needed for muscle spasms., Starting Fri 02/09/2017, Print         Note:  This document was prepared using Dragon voice recognition software and may include unintentional dictation errors.    Johnn Hai, PA-C 02/09/17 1726    Orbie Pyo, MD 02/12/17 [id number]

## 2017-02-09 NOTE — ED Triage Notes (Signed)
Pt to ED via POV, pt states that for the past week he has been having left flank pain, pain is now on both sides. Pt states that it hurts to sit. Pt states that he feels like he is not urinating as much as he normally does. Pt states that his urine has been dark and cloudy. Pt in NAD in triage.

## 2017-02-09 NOTE — Discharge Instructions (Signed)
Follow-up to primary care physician. Call and make an appointment. Today your blood sugar was 261. You should begin taking your diabetes medicine on a daily basis. Today you are given a prescription for ibuprofen 600 mg 3 times a day with food. Robaxin 500 mg every 6 hours as needed for muscle spasms for 3 days. Tramadol as needed for pain. Beware that he cannot drive while taking this medication as it may cause drowsiness. Use to follow-up with your doctor for your chronic back problems if any continued problems. Continue your gabapentin on a daily basis.  Also use heat or ice to your back as needed for pain and inflammation.

## 2017-07-05 ENCOUNTER — Emergency Department
Admission: EM | Admit: 2017-07-05 | Discharge: 2017-07-05 | Disposition: A | Discharge status: Home / Self Care | Payer: BLUE CROSS/BLUE SHIELD | Attending: Emergency Medicine | Admitting: Emergency Medicine

## 2017-07-05 ENCOUNTER — Encounter: Payer: Self-pay | Admitting: Emergency Medicine

## 2017-07-05 DIAGNOSIS — Y929 Unspecified place or not applicable: Secondary | ICD-10-CM | POA: Insufficient documentation

## 2017-07-05 DIAGNOSIS — E119 Type 2 diabetes mellitus without complications: Secondary | ICD-10-CM | POA: Insufficient documentation

## 2017-07-05 DIAGNOSIS — Y939 Activity, unspecified: Secondary | ICD-10-CM | POA: Insufficient documentation

## 2017-07-05 DIAGNOSIS — T161XXA Foreign body in right ear, initial encounter: Secondary | ICD-10-CM | POA: Insufficient documentation

## 2017-07-05 DIAGNOSIS — X58XXXA Exposure to other specified factors, initial encounter: Secondary | ICD-10-CM | POA: Insufficient documentation

## 2017-07-05 DIAGNOSIS — Z79899 Other long term (current) drug therapy: Secondary | ICD-10-CM | POA: Insufficient documentation

## 2017-07-05 DIAGNOSIS — Y999 Unspecified external cause status: Secondary | ICD-10-CM | POA: Insufficient documentation

## 2017-07-05 DIAGNOSIS — S00411A Abrasion of right ear, initial encounter: Secondary | ICD-10-CM | POA: Insufficient documentation

## 2017-07-05 DIAGNOSIS — F17210 Nicotine dependence, cigarettes, uncomplicated: Secondary | ICD-10-CM | POA: Insufficient documentation

## 2017-07-05 DIAGNOSIS — Z7984 Long term (current) use of oral hypoglycemic drugs: Secondary | ICD-10-CM | POA: Insufficient documentation

## 2017-07-05 MED ORDER — CIPROFLOXACIN-DEXAMETHASONE 0.3-0.1 % OT SUSP: 4.0000 [drp] | Freq: Two times a day (BID) | OTIC | 0 refills | Status: AC

## 2017-07-05 NOTE — ED Provider Notes (Signed)
 [facility] Emergency Department Provider Note  ____________________________________________   First MD Initiated Contact with Patient 07/05/17 0709     (approximate)  I have reviewed the triage vital signs and the nursing notes.   HISTORY  Chief Complaint Ear Drainage    HPI Joel Page is a 46 y.o. male who comes to the emergency department with bloody discharge from his right ear that he noted this morning when he got up to use the restroom. He said before going to bed last night his ears felt sweaty so he used a Q-tip to dry them out. He noted no pain. When he awoke this morning he noted decreased hearing in his right ear looked down and saw blood stains on his pillow. His symptoms are insidious in onset. He feels like they have improved somewhat. He's had no antecedent URI symptoms or allergies. He is currently in no pain.   Past Medical History:  Diagnosis Date  . Allergy   . DDD (degenerative disc disease)   . Diabetes mellitus   . Insomnia   . Lumbar stenosis   . 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 Hyperlipidemia LDL goal <100 07/04/2016  . Obesity 06/29/2016  . Back pain at L4-L5 level 04/07/2016  . Cough 03/30/2016  . Medication monitoring encounter 03/30/2016  . Long term prescription benzodiazepine use 03/12/2016  . COPD suggested by initial evaluation (HCC) 12/22/2015  . 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  . Diabetes mellitus type 2, uncontrolled (HCC) 05/11/2015  . Current smoker 05/11/2015  . Hemorrhoids, internal 05/11/2015  . Arthralgia of hip 05/11/2015  . Adiposity 05/11/2015  . 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  . Restless leg 05/11/2015  . Trochanteric bursitis of right hip 06/17/2013  . Sciatica 03/19/2012  . Lumbar degenerative disc disease 03/19/2012    Past Surgical History:    Procedure Laterality Date  . EXCISIONAL HEMORRHOIDECTOMY  2012  . ORIF FEMUR FRACTURE  1989  . SPHINCTEROTOMY    . TOOTH EXTRACTION      Prior to Admission medications   Medication Sig Start Date End Date Taking? Authorizing Provider  albuterol (PROVENTIL HFA) 108 (90 Base) MCG/ACT inhaler Inhale 1-2 puffs into the lungs every 6 (six) hours as needed. 12/22/15   Sundaram, Ashany, MD  atorvastatin (LIPITOR) 80 MG tablet Take 1 tablet (80 mg total) by mouth at bedtime. 07/04/16   Lada, Melinda P, MD  Blood Glucose Monitoring Suppl (BAYER CONTOUR NEXT MONITOR) w/Device KIT 1 Device by Does not apply route 1 day or 1 dose. DX: DM E11.5 12/22/15   Sundaram, Ashany, MD  ciprofloxacin-dexamethasone (CIPRODEX) OTIC suspension Place 4 drops into the right ear 2 (two) times daily. 07/05/17 07/12/17  Rifenbark, Neil, MD  gabapentin (NEURONTIN) 100 MG capsule Take 100 mg by mouth at bedtime.    [provider]  ibuprofen (ADVIL,MOTRIN) 600 MG tablet Take 1 tablet (600 mg total) by mouth every 8 (eight) hours as needed. 02/09/17   Summers, Rhonda L, PA-C  metFORMIN (GLUCOPHAGE) 500 MG tablet Take 2 tablets (1,000 mg total) by mouth 2 (two) times daily with a meal. 07/04/16   Lada, Melinda P, MD  methocarbamol (ROBAXIN) 500 MG tablet Take 1 tablet (500 mg total) by mouth every 6 (six) hours as needed for muscle spasms. 02/09/17   Summers, Rhonda L, PA-C  traMADol (ULTRAM) 50 MG   tablet Take 1 tablet (50 mg total) by mouth every 6 (six) hours as needed. 02/09/17   Johnn Hai, PA-C    Allergies Topiramate; Trazodone; Baclofen; and Diclofenac sodium  Family History  Problem Relation Age of Onset  . Lung cancer Mother   . Dementia Father   . COPD Father     Social History Social History  Substance Use Topics  . Smoking status: Current Some Day Smoker    Packs/day: 1.00    Types: Cigarettes  . Smokeless tobacco: Former Systems developer  . Alcohol use 0.0 oz/week    Review of Systems Constitutional:  No fever/chills ENT: No sore throat. Cardiovascular: Denies chest pain. Respiratory: Denies shortness of breath. Gastrointestinal: No abdominal pain.  No nausea, no vomiting.  No diarrhea.  No constipation. Musculoskeletal: Negative for back pain. Neurological: Negative for headaches   ____________________________________________   PHYSICAL EXAM:  VITAL SIGNS: ED Triage Vitals  Enc Vitals Group     BP 07/05/17 0559 138/88     Pulse Rate 07/05/17 0559 88     Resp 07/05/17 0559 16     Temp 07/05/17 0559 97.7 F (36.5 C)     Temp Source 07/05/17 0559 Oral     SpO2 07/05/17 0559 99 %     Weight 07/05/17 0559 238 lb (108 kg)     Height --      Head Circumference --      Peak Flow --      Pain Score 07/05/17 0629 5     Pain Loc --      Pain Edu? --      Excl. in [place]? --     Constitutional: Alert and oriented 4 pleasant cooperative speaks full clear sentences Diaphoresis Head: Atraumatic. Nose: No congestion/rhinnorhea. Mouth/Throat: Left tympanic membrane normal right tympanic membrane normal no perforation there are abrasions in the external aspect of the right auditory canal with blood clot which was easily removed Neck: No stridor.   Cardiovascular: Regular rate and rhythm Respiratory: Normal respiratory effort.  No retractions. Neurologic:  Normal speech and language. No gross focal neurologic deficits are appreciated.  Skin:  Skin is warm, dry and intact. No rash noted.    ____________________________________________  LABS (all labs ordered are listed, but only abnormal results are displayed)  Labs Reviewed - No data to display   __________________________________________  EKG   ____________________________________________  RADIOLOGY   ____________________________________________   PROCEDURES  Procedure(s) performed: no  Procedures  Critical Care performed: no  Observation: no ____________________________________________   INITIAL  IMPRESSION / ASSESSMENT AND PLAN / ED COURSE  Pertinent labs & imaging results that were available during my care of the patient were reviewed by me and considered in my medical decision making (see chart for details).  The patient arrives hemodynamically stable very well-appearing. I was unable to initially visualize his right tympanic membrane secondary to blood clot however was easily removed and afterwards was able to fully visualize his tympanic membrane with no perforation noted. No evidence of infection. He does have abrasions throughout the inner aspect of his right auditory canal consistent with trauma. Given the risk of canal narrowing HIM Ciprodex primarily for the dexamethasone as well as given ENT follow-up. Discharged home in improved condition.      ____________________________________________   FINAL CLINICAL IMPRESSION(S) / ED DIAGNOSES  Final diagnoses:  Ear canal abrasion, right, initial encounter  Foreign body of right ear, initial encounter      NEW MEDICATIONS STARTED DURING THIS VISIT:  New Prescriptions   CIPROFLOXACIN-DEXAMETHASONE (CIPRODEX) OTIC SUSPENSION    Place 4 drops into the right ear 2 (two) times daily.     Note:  This document was prepared using Dragon voice recognition software and may include unintentional dictation errors.      Darel Hong, MD 07/05/17 [id number]

## 2017-07-05 NOTE — ED Triage Notes (Signed)
Pt reports he woke this AM with his right ear bleeding. Pt denies injury, recent water sport and denies pain to area. Pt has dry blood in ear canal, no bleeding at this time.

## 2017-07-05 NOTE — Discharge Instructions (Signed)
Please use your eardrops twice a day every day and do not use any Q-tips inside your ear for the next month. Make an appointment to follow-up with the ear nose and throat specialist in one week if her symptoms are not improving. Return to the emergency department for any concerns.  It was a pleasure to take care of you today, and thank you for coming to our emergency department.  If you have any questions or concerns before leaving please ask the nurse to grab me and I'm more than happy to go through your aftercare instructions again.  If you were prescribed any opioid pain medication today such as Norco, Vicodin, Percocet, morphine, hydrocodone, or oxycodone please make sure you do not drive when you are taking this medication as it can alter your ability to drive safely.  If you have any concerns once you are home that you are not improving or are in fact getting worse before you can make it to your follow-up appointment, please do not hesitate to call 911 and come back for further evaluation.  Darel Hong, MD

## 2017-07-19 ENCOUNTER — Other Ambulatory Visit: Payer: Self-pay | Admitting: Family Medicine

## 2017-07-19 DIAGNOSIS — E1165 Type 2 diabetes mellitus with hyperglycemia: Principal | ICD-10-CM

## 2017-07-19 DIAGNOSIS — IMO0001 Reserved for inherently not codable concepts without codable children: Secondary | ICD-10-CM

## 2017-07-19 NOTE — Telephone Encounter (Signed)
Patient has not been seen in over a year He may have transferred care If so, please remove my name as PCP If he still considers [place] his medical home, he really needs to be seen for his diabetes and to get labs and refills Thank you

## 2017-07-19 NOTE — Telephone Encounter (Signed)
Called pt and explained he needed to come in for a visit for refills,pt states he does not have insurance right now and cannot afford to come in. [facility]

## 2018-07-09 ENCOUNTER — Encounter: Payer: Self-pay | Admitting: Family Medicine

## 2018-07-09 ENCOUNTER — Ambulatory Visit: Payer: Self-pay | Admitting: Family Medicine

## 2018-07-09 VITALS — BP 116/68 | HR 87 | Temp 98.3°F | Ht 69.0 in | Wt 226.3 lb

## 2018-07-09 DIAGNOSIS — Z5181 Encounter for therapeutic drug level monitoring: Secondary | ICD-10-CM

## 2018-07-09 DIAGNOSIS — E6609 Other obesity due to excess calories: Secondary | ICD-10-CM

## 2018-07-09 DIAGNOSIS — F17200 Nicotine dependence, unspecified, uncomplicated: Secondary | ICD-10-CM

## 2018-07-09 DIAGNOSIS — E1165 Type 2 diabetes mellitus with hyperglycemia: Secondary | ICD-10-CM

## 2018-07-09 DIAGNOSIS — E785 Hyperlipidemia, unspecified: Secondary | ICD-10-CM

## 2018-07-09 DIAGNOSIS — R1011 Right upper quadrant pain: Secondary | ICD-10-CM

## 2018-07-09 DIAGNOSIS — R195 Other fecal abnormalities: Secondary | ICD-10-CM

## 2018-07-09 DIAGNOSIS — Z6833 Body mass index (BMI) 33.0-33.9, adult: Secondary | ICD-10-CM

## 2018-07-09 LAB — POCT GLYCOSYLATED HEMOGLOBIN (HGB A1C)
HbA1c POC (<> result, manual entry): 6.7 % (ref 4.0–5.6)
HbA1c, POC (controlled diabetic range): 0 % (ref 0.0–7.0)
HbA1c, POC (prediabetic range): 0 % — AB (ref 5.7–6.4)
Hemoglobin A1C: 6.7 % — AB (ref 4.0–5.6)

## 2018-07-09 LAB — GLUCOSE, POCT (MANUAL RESULT ENTRY): POC GLUCOSE: 205 mg/dL — AB (ref 70–99)

## 2018-07-09 NOTE — Assessment & Plan Note (Signed)
Encouraged smoking cessation; explained that smoking + diabetes = heart attack; he is putting himself at greatly increased risk of having a heart attack if he continues to smoke; other risks discussed such as stroke, erectile dysfunction, kidney damage, esophageal and gastric cancers, etc.; see AVS

## 2018-07-09 NOTE — Assessment & Plan Note (Signed)
Check lipids; statin indicated, but will see what LDL is and what LFTs are before prescribing; limit saturated fats; work on weight loss

## 2018-07-09 NOTE — Assessment & Plan Note (Addendum)
Foot exam by MD today; check urine, blood work; off of metformin for quite some time; patient wants to try natural products and diet and weight loss rather thanmetformin

## 2018-07-09 NOTE — Progress Notes (Signed)
BP 116/68   Pulse 87   Temp 98.3 F (36.8 C)   Ht 5\' 9"  (1.753 m)   Wt 226 lb 4.8 oz (102.6 kg)   SpO2 96%   BMI 33.42 kg/m    Subjective:    Patient ID: Joel Page, male    DOB: 16-Jan-1971, 47 y.o.   MRN: [id number]  HPI: Joel Page is a 47 y.o. male  Chief Complaint  Patient presents with  . Follow-up    HPI Patient is here for follow-up after long absence; he has not been seen since August 2017; he declined the flu shot today  He has diabetes; type 2; trying to control with diet alone; dry mouth, always drinking something; has a large drink from [employer] with him today, Coke zero but had a bagel and hostess cake; no blurred vision; weight loss is purposeful, he says; down from 265 pounds over the last two years; off of metformin for about two years; grandmother and brother, both had DM (both deceased); GM died from liver cancer; brother got a scratch on his leg, got gangrene, 2023-10-30 in hospital, dead on 2023-10-31, sepsis; cirrhosis of liver, pancreatic cancer; using cinnamon for his diabetes; eye exam recommended but he does not have insurance  He says he is not depressed, but is concerned about his liver; he told the CMA; he told her that he lost his job and had a DUI, not working; [place] had liver cancer; licensed facilities have done all tattoos; no IVDU; stools will look abnormal, orange and oily; not sure if taking fish oil was the cause; does not happen all the time; did notice abd pain during exam, but hadn't really noticed that before    Office Visit from 07/09/2018 in [facility]  AUDIT-C Score  3     Worried about his fingers; getting "all tore up"; picks at calluses and edges along nails; healthy eater  Taking any of his medicines, just fish oil, cinnamon, echinacea; supposed to be as good as metformin  High cholesterol; taking fish oil; eats bacon occasionally; hot dogs occasionally; lots of red meats, that is his  staple  Depression screen [facility] 2/9 07/09/2018 06/29/2016 04/21/2016 04/07/2016 03/31/2016  Decreased Interest 0 0 0 0 0  Down, Depressed, Hopeless 0 0 0 0 0  PHQ - 2 Score 0 0 0 0 0    Relevant past medical, surgical, family and social history reviewed Past Medical History:  Diagnosis Date  . Allergy   . DDD (degenerative disc disease)   . Diabetes mellitus   . Insomnia   . Lumbar stenosis   . Retinopathy due to secondary diabetes ([place]) 06/24/12   both eyes   Past Surgical History:  Procedure Laterality Date  . EXCISIONAL HEMORRHOIDECTOMY  2012  . ORIF FEMUR FRACTURE  1989  . SPHINCTEROTOMY    . TOOTH EXTRACTION     Family History  Problem Relation Age of Onset  . Lung cancer Mother   . Diabetes Mother   . Dementia Father   . COPD Father   . Diabetes Brother    Social History   Tobacco Use  . Smoking status: Current Some Day Smoker    Packs/day: 1.50    Types: Cigarettes  . Smokeless tobacco: Current User  Substance Use Topics  . Alcohol use: Yes    Alcohol/week: 0.0 standard drinks  . 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 Allergies and medications reviewed  Review of Systems Per HPI unless specifically indicated above     Objective:    BP 116/68   Pulse 87   Temp 98.3 F (36.8 C)   Ht 5\' 9"  (1.753 m)   Wt 226 lb 4.8 oz (102.6 kg)   SpO2 96%   BMI 33.42 kg/m   Wt Readings from Last 3 Encounters:  07/09/18 226 lb 4.8 oz (102.6 kg)  07/05/17 238 lb (108 kg)  02/09/17 246  lb (111.6 kg)    Physical Exam  Constitutional: He appears well-developed and well-nourished. No distress.  Obese, but weight down 20 pounds over last 17 months  HENT:  Head: Normocephalic and atraumatic.  Eyes: EOM are normal. No scleral icterus.  Neck: No thyromegaly present.  Cardiovascular: Normal rate and regular rhythm.  Pulmonary/Chest: Effort normal and breath sounds normal.  Abdominal: Soft. Bowel sounds are normal. He exhibits no distension. There is no hepatosplenomegaly. There is tenderness in the right upper quadrant.  Musculoskeletal: He exhibits no edema.  Neurological: Coordination normal.  Skin: Skin is warm and dry. No pallor.  Evidence of cuticle picking at edge of nails; nothing to suggest scleroderma at fingertips  Psychiatric: He has a normal mood and affect. His behavior is normal. Judgment and thought content normal. His mood appears not anxious. He does not exhibit a depressed mood.   Diabetic Foot Form - Detailed   Diabetic Foot Exam - detailed Diabetic Foot exam was performed with the following findings:  Yes 07/09/2018 12:21 PM  Visual Foot Exam completed.:  Yes  Pulse Foot Exam completed.:  Yes  Right Dorsalis Pedis:  Present Left Dorsalis Pedis:  Present  Sensory Foot Exam Completed.:  Yes Semmes-Weinstein Monofilament Test R Site 1-Great Toe:  Pos L Site 1-Great Toe:  Pos         Results for orders placed or performed in visit on 07/09/18  POCT HgB A1C  Result Value Ref Range   Hemoglobin A1C 6.7 (A) 4.0 - 5.6 %   HbA1c POC (<> result, manual entry) 6.7 4.0 - 5.6 %   HbA1c, POC (prediabetic range) 0 (A) 5.7 - 6.4 %   HbA1c, POC (controlled diabetic range) 0.0 0.0 - 7.0 %  POCT Glucose (CBG)  Result Value Ref Range   POC Glucose 205 (A) 70 - 99 mg/dl      Assessment & Plan:   Problem List Items Addressed This Visit      Endocrine   Diabetes mellitus type 2, uncontrolled ([place]) - Primary    Foot exam by MD today; check urine, blood work;  off of metformin for quite some time; patient wants to try natural products and diet and weight loss rather thanmetformin      Relevant Orders   Microalbumin / creatinine urine ratio   Lipid panel   COMPLETE METABOLIC PANEL WITH GFR   POCT HgB A1C (Completed)   POCT Glucose (CBG)     Other   Obesity  Encouraged weight loss, goal BMI is less than 30 for our next step      Medication monitoring encounter    Check liver and kidneys      Relevant Orders   COMPLETE METABOLIC PANEL WITH GFR   Hyperlipidemia LDL goal <100    Check lipids; statin indicated, but will see what LDL is and what LFTs are before prescribing; limit saturated fats; work on weight loss      Relevant Orders   Lipid panel   Current smoker    Encouraged smoking cessation; explained that smoking + diabetes = heart attack; he is putting himself at greatly increased risk of having a heart attack if he continues to smoke; other risks discussed such as stroke, erectile dysfunction, kidney damage, esophageal and gastric cancers, etc.; see AVS       Other Visit Diagnoses    Abdominal pain, RUQ       pt worried about his liver; discomfort on exam RUQ;w ill get labs; [place]; refer to GI   Relevant Orders   US ABDOMEN LIMITED RUQ   Gamma GT   Ambulatory referral to Gastroenterology   Abnormal stools       refer to GI; pt does not think related to fish oil, intermittent; he is worried about his liver   Relevant Orders   Ambulatory referral to Gastroenterology       Follow up plan: Return in about 4 weeks (around 08/06/2018) for follow-up visit with Dr. Sanda Klein or Benjamine Mola.  An after-visit summary was printed and given to the patient at [facility].  Please see the patient instructions which may contain other information and recommendations beyond what is mentioned above in the assessment and plan.  No orders of the defined types were placed in this encounter.   Orders Placed This Encounter  Procedures  . US ABDOMEN  LIMITED RUQ  . Microalbumin / creatinine urine ratio  . Lipid panel  . COMPLETE METABOLIC PANEL WITH GFR  . Gamma GT  . Ambulatory referral to Gastroenterology  . POCT HgB A1C  . POCT Glucose (CBG)

## 2018-07-09 NOTE — Assessment & Plan Note (Signed)
Check liver and kidneys 

## 2018-07-09 NOTE — Assessment & Plan Note (Signed)
Encouraged weight loss, goal BMI is less than 30 for our next step

## 2018-07-09 NOTE — Patient Instructions (Addendum)
I do encourage you to quit smoking Call [phone] to sign up for smoking cessation classes You can call 1-800-QUIT-NOW to talk with a smoking cessation coach  Let's get labs today We'll have you see liver and colon specialist (GI doctor) If you have not heard anything from my staff in a week about any orders/referrals/studies from today, please contact us here to follow-up [phone]  Check out the information at [email or web address] entitled "Nutrition for Weight Loss: What You Need to Know about Fad Diets" Try to lose between 1-2 pounds per week by taking in fewer calories and burning off more calories You can succeed by limiting portions, limiting foods dense in calories and fat, becoming more active, and drinking 8 glasses of water a day (64 ounces) Don't skip meals, especially breakfast, as skipping meals may alter your metabolism Do not use over-the-counter weight loss pills or gimmicks that claim rapid weight loss A healthy BMI (or body mass index) is between 18.5 and 24.9 You can calculate your ideal BMI at the [place] website ClubMonetize.fr    Steps to Quit Smoking Smoking tobacco can be bad for your health. It can also affect almost every organ in your body. Smoking puts you and people around you at risk for many serious long-lasting (chronic) diseases. Quitting smoking is hard, but it is one of the best things that you can do for your health. It is never too late to quit. What are the benefits of quitting smoking? When you quit smoking, you lower your risk for getting serious diseases and conditions. They can include:  Lung cancer or lung disease.  Heart disease.  Stroke.  Heart attack.  Not being able to have children (infertility).  Weak bones (osteoporosis) and broken bones (fractures).  If you have coughing, wheezing, and shortness of breath, those symptoms may get better when you quit. You may also get sick less  often. If you are pregnant, quitting smoking can help to lower your chances of having a baby of low birth weight. What can I do to help me quit smoking? Talk with your doctor about what can help you quit smoking. Some things you can do (strategies) include:  Quitting smoking totally, instead of slowly cutting back how much you smoke over a period of time.  Going to in-person counseling. You are more likely to quit if you go to many counseling sessions.  Using resources and support systems, such as: ? Database administrator with a Social worker. ? Phone quitlines. ? Careers information officer. ? Support groups or group counseling. ? Text messaging programs. ? Mobile phone apps or applications.  Taking medicines. Some of these medicines may have nicotine in them. If you are pregnant or breastfeeding, do not take any medicines to quit smoking unless your doctor says it is okay. Talk with your doctor about counseling or other things that can help you.  Talk with your doctor about using more than one strategy at the same time, such as taking medicines while you are also going to in-person counseling. This can help make quitting easier. What things can I do to make it easier to quit? Quitting smoking might feel very hard at first, but there is a lot that you can do to make it easier. Take these steps:  Talk to your family and friends. Ask them to support and encourage you.  Call phone quitlines, reach out to support groups, or work with a Social worker.  Ask people who smoke to not smoke around you.  Avoid  places that make you want (trigger) to smoke, such as: ? Bars. ? Parties. ? Smoke-break areas at work.  Spend time with people who do not smoke.  Lower the stress in your life. Stress can make you want to smoke. Try these things to help your stress: ? Getting regular exercise. ? Deep-breathing exercises. ? Yoga. ? Meditating. ? Doing a body scan. To do this, close your eyes, focus on one area of  your body at a time from head to toe, and notice which parts of your body are tense. Try to relax the muscles in those areas.  Download or buy apps on your mobile phone or tablet that can help you stick to your quit plan. There are many free apps, such as QuitGuide from the [employer] Office manager for Disease Control and Prevention). You can find more support from smokefree.gov and other websites.  This information is not intended to replace advice given to you by your health care provider. Make sure you discuss any questions you have with your health care provider. Document Released: 09/02/2009 Document Revised: 07/04/2016 Document Reviewed: 03/23/2015 Elsevier Interactive Patient Education  2018 [place] Risks of Smoking Smoking cigarettes is very bad for your health. Tobacco smoke has over 200 known poisons in it. It contains the poisonous gases nitrogen oxide and carbon monoxide. There are over 60 chemicals in tobacco smoke that cause cancer. Smoking is difficult to quit because a chemical in tobacco, called nicotine, causes addiction or dependence. When you smoke and inhale, nicotine is absorbed rapidly into the bloodstream through your lungs. Both inhaled and non-inhaled nicotine may be addictive. What are the risks of cigarette smoke? Cigarette smokers have an increased risk of many serious medical problems, including:  Lung cancer.  Lung disease, such as pneumonia, bronchitis, and emphysema.  Chest pain (angina) and heart attack because the heart is not getting enough oxygen.  Heart disease and peripheral blood vessel disease.  High blood pressure (hypertension).  Stroke.  Oral cancer, including cancer of the lip, mouth, or voice box.  Bladder cancer.  Pancreatic cancer.  Cervical cancer.  Pregnancy complications, including premature birth.  Stillbirths and smaller newborn babies, birth defects, and genetic damage to sperm.  Early menopause.  Lower estrogen level  for women.  Infertility.  Facial wrinkles.  Blindness.  Increased risk of broken bones (fractures).  Senile dementia.  Stomach ulcers and internal bleeding.  Delayed wound healing and increased risk of complications during surgery.  Even smoking lightly shortens your life expectancy by several years.  Because of secondhand smoke exposure, children of smokers have an increased risk of the following:  Sudden infant death syndrome (SIDS).  Respiratory infections.  Lung cancer.  Heart disease.  Ear infections.  What are the benefits of quitting? There are many health benefits of quitting smoking. Here are some of them:  Within days of quitting smoking, your risk of having a heart attack decreases, your blood flow improves, and your lung capacity improves. Blood pressure, pulse rate, and breathing patterns start returning to normal soon after quitting.  Within months, your lungs may clear up completely.  Quitting for 10 years reduces your risk of developing lung cancer and heart disease to almost that of a nonsmoker.  People who quit may see an improvement in their overall quality of life.  How do I quit smoking? Smoking is an addiction with both physical and psychological effects, and longtime habits can be hard to change. Your health care provider can recommend:  Programs and community resources, which may include group support, education, or talk therapy.  Prescription medicines to help reduce cravings.  Nicotine replacement products, such as patches, gum, and nasal sprays. Use these products only as directed. Do not replace cigarette smoking with electronic cigarettes, which are commonly called e-cigarettes. The safety of e-cigarettes is not known, and some may contain harmful chemicals.  A combination of two or more of these methods.  Where to find more information:  American Lung Association: www.lung.org  American Cancer Society:  www.cancer.org Summary  Smoking cigarettes is very bad for your health. Cigarette smokers have an increased risk of many serious medical problems, including several cancers, heart disease, and stroke.  Smoking is an addiction with both physical and psychological effects, and longtime habits can be hard to change.  By stopping right away, you can greatly reduce the risk of medical problems for you and your family.  To help you quit smoking, your health care provider can recommend programs, community resources, prescription medicines, and nicotine replacement products such as patches, gum, and nasal sprays. This information is not intended to replace advice given to you by your health care provider. Make sure you discuss any questions you have with your health care provider. Document Released: 12/14/2004 Document Revised: 11/10/2016 Document Reviewed: 11/10/2016 Elsevier Interactive Patient Education  2017 Else[employer] Inc[employer]  Preventing Unhealthy [employer], Adult Staying at a healthy weight is important. When fat builds up in your body, you may become overweight or obese. These conditions put you at greater risk for developing certain health problems, such as heart disease, diabetes, sleeping problems, joint problems, and some cancers. Unhealthy weight gain is often the result of making unhealthy choices in what you eat. It is also a result of not getting enough exercise. You can make changes to your lifestyle to prevent obesity and stay as healthy as possible. What nutrition changes can be made? To maintain a healthy weight and prevent obesity:  Eat only as much as your body needs. To do this: ? Pay attention to signs that you are hungry or full. Stop eating as soon as you feel full. ? If you feel hungry, try drinking water first. Drink enough water so your urine is clear or pale yellow. ? Eat smaller portions. ? Look at serving sizes on food labels. Most foods contain more than one serving per  container. ? Eat the recommended amount of calories for your gender and activity level. While most active people should eat around 2,000 calories per day, if you are trying to lose weight or are not very active, you main need to eat less calories. Talk to your health care provider or dietitian about how many calories you should eat each day.  Choose healthy foods, such as: ? Fruits and vegetables. Try to fill at least half of your plate at each meal with fruits and vegetables. ? Whole grains, such as whole wheat bread, brown rice, and quinoa. ? Lean meats, such as chicken or fish. ? Other healthy proteins, such as beans, eggs, or tofu. ? Healthy fats, such as nuts, seeds, fatty fish, and olive oil. ? Low-fat or fat-free dairy.  Check food labels and avoid food and drinks that: ? Are high in calories. ? Have added sugar. ? Are high in sodium. ? Have saturated fats or trans fats.  Limit how much you eat of the following foods: ? Prepackaged meals. ? Fast food. ? Fried foods. ? Processed meat, such as bacon, sausage, and deli  meats. ? Fatty cuts of red meat and poultry with skin.  Cook foods in healthier ways, such as by baking, broiling, or grilling.  When grocery shopping, try to shop around the outside of the store. This helps you buy mostly fresh foods and avoid canned and prepackaged foods.  What lifestyle changes can be made?  Exercise at least 30 minutes 5 or more days each week. Exercising includes brisk walking, yard work, biking, running, swimming, and team sports like basketball and soccer. Ask your health care provider which exercises are safe for you.  Do not use any products that contain nicotine or tobacco, such as cigarettes and e-cigarettes. If you need help quitting, ask your health care provider.  Limit alcohol intake to no more than 1 drink a day for nonpregnant women and 2 drinks a day for men. One drink equals 12 oz of beer, 5 oz of wine, or 1 oz of hard  liquor.  Try to get 7-9 hours of sleep each night. What other changes can be made?  Keep a food and activity journal to keep track of: ? What you ate and how many calories you had. Remember to count sauces, dressings, and side dishes. ? Whether you were active, and what exercises you did. ? Your calorie, weight, and activity goals.  Check your weight regularly. Track any changes. If you notice you have gained weight, make changes to your diet or activity routine.  Avoid taking weight-loss medicines or supplements. Talk to your health care provider before starting any new medicine or supplement.  Talk to your health care provider before trying any new diet or exercise plan. Why are these changes important? Eating healthy, staying active, and having healthy habits not only help prevent obesity, they also:  Help you to manage stress and emotions.  Help you to connect with friends and family.  Improve your self-esteem.  Improve your sleep.  Prevent long-term health problems.  What can happen if changes are not made? Being obese or overweight can cause you to develop joint or bone problems, which can make it hard for you to stay active or do activities you enjoy. Being obese or overweight also puts stress on your heart and lungs and can lead to health problems like diabetes, heart disease, and some cancers. Where to find more information: Talk with your health care provider or a dietitian about healthy eating and healthy lifestyle choices. You may also find other information through these resources:  [place] Department of Agriculture MyPlate: FormerBoss.no  American Heart Association: www.heart.org  Centers for Disease Control and Prevention: http://www.wolf.info/  Summary  Staying at a healthy weight is important. It helps prevent certain diseases and health problems, such as heart disease, diabetes, joint problems, sleep disorders, and some cancers.  Being obese or overweight can  cause you to develop joint or bone problems, which can make it hard for you to stay active or do activities you enjoy.  You can prevent unhealthy weight gain by eating a healthy diet, exercising regularly, not smoking, limiting alcohol, and getting enough sleep.  Talk with your health care provider or a dietitian for guidance about healthy eating and healthy lifestyle choices. This information is not intended to replace advice given to you by your health care provider. Make sure you discuss any questions you have with your health care provider. Document Released: 11/07/2016 Document Revised: 12/13/2016 Document Reviewed: 12/13/2016 Elsevier Interactive Patient Education  [name].

## 2018-07-12 ENCOUNTER — Telehealth: Payer: Self-pay | Admitting: Family Medicine

## 2018-07-12 ENCOUNTER — Ambulatory Visit: Payer: Self-pay

## 2018-07-12 ENCOUNTER — Encounter: Payer: Self-pay | Admitting: Family Medicine

## 2018-07-12 LAB — COMPLETE METABOLIC PANEL WITH GFR
AG Ratio: 1.9 (calc) (ref 1.0–2.5)
ALT: 12 U/L (ref 9–46)
AST: 11 U/L (ref 10–40)
Albumin: 4.1 g/dL (ref 3.6–5.1)
Alkaline phosphatase (APISO): 84 U/L (ref 40–115)
BUN: 11 mg/dL (ref 7–25)
CO2: 25 mmol/L (ref 20–32)
Calcium: 8.5 mg/dL — ABNORMAL LOW (ref 8.6–10.3)
Chloride: 107 mmol/L (ref 98–110)
Creat: 0.88 mg/dL (ref 0.60–1.35)
GFR, Est African American: 118 mL/min/{1.73_m2} (ref >=60)
GFR, Est Non African American: 102 mL/min/{1.73_m2} (ref >=60)
Globulin: 2.2 g/dL (calc) (ref 1.9–3.7)
Glucose, Bld: 154 mg/dL — ABNORMAL HIGH (ref 65–139)
Potassium: 4.2 mmol/L (ref 3.5–5.3)
Sodium: 140 mmol/L (ref 135–146)
Total Bilirubin: 0.3 mg/dL (ref 0.2–1.2)
Total Protein: 6.3 g/dL (ref 6.1–8.1)

## 2018-07-12 LAB — LIPID PANEL
CHOL/HDL RATIO: 5.6 (calc) — AB (ref <5.0)
Cholesterol: 224 mg/dL — ABNORMAL HIGH (ref <200)
HDL: 40 mg/dL — ABNORMAL LOW (ref >40)
LDL CHOLESTEROL (CALC): 165 mg/dL — AB
Non-HDL Cholesterol (Calc): 184 mg/dL (calc) — ABNORMAL HIGH (ref <130)
Triglycerides: 84 mg/dL (ref <150)

## 2018-07-12 LAB — MICROALBUMIN / CREATININE URINE RATIO
CREATININE, URINE: 93 mg/dL (ref 20–320)
Microalb Creat Ratio: 4 mcg/mg creat (ref <30)
Microalb, Ur: 0.4 mg/dL

## 2018-07-12 LAB — GAMMA GT: GGT: 22 U/L (ref 3–95)

## 2018-07-12 MED ORDER — METFORMIN HCL ER 500 MG PO TB24: ORAL_TABLET | ORAL | 0 refills | Status: DC

## 2018-07-12 MED ORDER — ATORVASTATIN CALCIUM 20 MG PO TABS: 20.0000 mg | ORAL_TABLET | Freq: Every day | ORAL | 1 refills | Status: DC

## 2018-07-12 NOTE — Telephone Encounter (Signed)
I do not have any labs back yet Please check on these Thank you

## 2018-07-12 NOTE — Telephone Encounter (Signed)
Pt.notified

## 2018-07-12 NOTE — Telephone Encounter (Signed)
Pt called to check status; contact when ready

## 2018-07-12 NOTE — Telephone Encounter (Signed)
Labs still have not crossed over Thank you for getting a paper copy to my desk Please let the patient know that his urine microalbumin to creatinine ratio is normal His cholesterol is quite high; I'm going to recommend starting a statin, in addition to encouraging him to lose weight and eat better His liver enzymes are normal; I see no evidence of liver dysfunction Start back on metformin

## 2018-07-15 ENCOUNTER — Ambulatory Visit: Payer: Self-pay

## 2018-07-23 ENCOUNTER — Other Ambulatory Visit: Payer: Self-pay

## 2018-07-23 ENCOUNTER — Emergency Department: Payer: Self-pay

## 2018-07-23 ENCOUNTER — Encounter: Payer: Self-pay | Admitting: Emergency Medicine

## 2018-07-23 ENCOUNTER — Emergency Department
Admission: EM | Admit: 2018-07-23 | Discharge: 2018-07-23 | Disposition: A | Discharge status: Home / Self Care | Payer: Self-pay | Attending: Emergency Medicine | Admitting: Emergency Medicine

## 2018-07-23 DIAGNOSIS — J40 Bronchitis, not specified as acute or chronic: Secondary | ICD-10-CM | POA: Insufficient documentation

## 2018-07-23 DIAGNOSIS — Z7984 Long term (current) use of oral hypoglycemic drugs: Secondary | ICD-10-CM | POA: Insufficient documentation

## 2018-07-23 DIAGNOSIS — R059 Cough, unspecified: Secondary | ICD-10-CM

## 2018-07-23 DIAGNOSIS — Z79899 Other long term (current) drug therapy: Secondary | ICD-10-CM | POA: Insufficient documentation

## 2018-07-23 DIAGNOSIS — J449 Chronic obstructive pulmonary disease, unspecified: Secondary | ICD-10-CM | POA: Insufficient documentation

## 2018-07-23 DIAGNOSIS — F17210 Nicotine dependence, cigarettes, uncomplicated: Secondary | ICD-10-CM | POA: Insufficient documentation

## 2018-07-23 DIAGNOSIS — E119 Type 2 diabetes mellitus without complications: Secondary | ICD-10-CM | POA: Insufficient documentation

## 2018-07-23 DIAGNOSIS — R05 Cough: Secondary | ICD-10-CM

## 2018-07-23 LAB — COMPREHENSIVE METABOLIC PANEL
ALBUMIN: 4 g/dL (ref 3.5–5.0)
ALK PHOS: 77 U/L (ref 38–126)
ALT: 16 U/L (ref 0–44)
AST: 18 U/L (ref 15–41)
Anion gap: 5 (ref 5–15)
BILIRUBIN TOTAL: 0.4 mg/dL (ref 0.3–1.2)
BUN: 20 mg/dL (ref 6–20)
CO2: 29 mmol/L (ref 22–32)
Calcium: 8.7 mg/dL — ABNORMAL LOW (ref 8.9–10.3)
Chloride: 109 mmol/L (ref 98–111)
Creatinine, Ser: 0.99 mg/dL (ref 0.61–1.24)
GFR calc Af Amer: >60 mL/min (ref >60)
GFR calc non Af Amer: >60 mL/min (ref >60)
GLUCOSE: 210 mg/dL — AB (ref 70–99)
POTASSIUM: 3.9 mmol/L (ref 3.5–5.1)
SODIUM: 143 mmol/L (ref 135–145)
TOTAL PROTEIN: 7 g/dL (ref 6.5–8.1)

## 2018-07-23 LAB — CBC WITH DIFFERENTIAL/PLATELET
BASOS PCT: 1 %
Basophils Absolute: 0.1 10*3/uL (ref 0–0.1)
Eosinophils Absolute: 0.1 10*3/uL (ref 0–0.7)
Eosinophils Relative: 1 %
HEMATOCRIT: 42.8 % (ref 40.0–52.0)
HEMOGLOBIN: 14.4 g/dL (ref 13.0–18.0)
LYMPHS PCT: 15 %
Lymphs Abs: 2.2 10*3/uL (ref 1.0–3.6)
MCH: 29 pg (ref 26.0–34.0)
MCHC: 33.7 g/dL (ref 32.0–36.0)
MCV: 86 fL (ref 80.0–100.0)
MONO ABS: 1.1 10*3/uL — AB (ref 0.2–1.0)
MONOS PCT: 7 %
NEUTROS ABS: 11.1 10*3/uL — AB (ref 1.4–6.5)
NEUTROS PCT: 76 %
Platelets: 179 10*3/uL (ref 150–440)
RBC: 4.98 MIL/uL (ref 4.40–5.90)
RDW: 15.6 % — AB (ref 11.5–14.5)
WBC: 14.6 10*3/uL — ABNORMAL HIGH (ref 3.8–10.6)

## 2018-07-23 LAB — LACTIC ACID, PLASMA: LACTIC ACID, VENOUS: 1.9 mmol/L (ref 0.5–1.9)

## 2018-07-23 LAB — TROPONIN I: Troponin I: <0.03 ng/mL (ref <0.03)

## 2018-07-23 LAB — LIPASE, BLOOD: Lipase: 34 U/L (ref 11–51)

## 2018-07-23 LAB — BRAIN NATRIURETIC PEPTIDE: B Natriuretic Peptide: 26 pg/mL (ref 0.0–100.0)

## 2018-07-23 MED ORDER — DEXAMETHASONE SODIUM PHOSPHATE 10 MG/ML IJ SOLN
10.0000 mg | Freq: Once | INTRAMUSCULAR | Status: AC
  Administered 2018-07-23: 10 mg via INTRAVENOUS
  Filled 2018-07-23: qty 1

## 2018-07-23 MED ORDER — ALBUTEROL SULFATE HFA 108 (90 BASE) MCG/ACT IN AERS: INHALATION_SPRAY | RESPIRATORY_TRACT | 1 refills | Status: DC

## 2018-07-23 MED ORDER — HYDROCODONE-ACETAMINOPHEN 5-325 MG PO TABS
2.0000 | ORAL_TABLET | Freq: Once | ORAL | Status: AC
  Administered 2018-07-23: 2 via ORAL
  Filled 2018-07-23: qty 2

## 2018-07-23 MED ORDER — IPRATROPIUM-ALBUTEROL 0.5-2.5 (3) MG/3ML IN SOLN
3.0000 mL | Freq: Once | RESPIRATORY_TRACT | Status: AC
  Administered 2018-07-23: 3 mL via RESPIRATORY_TRACT
  Filled 2018-07-23: qty 3

## 2018-07-23 MED ORDER — IPRATROPIUM-ALBUTEROL 0.5-2.5 (3) MG/3ML IN SOLN
RESPIRATORY_TRACT | Status: AC
  Administered 2018-07-23: 3 mL
  Filled 2018-07-23: qty 3

## 2018-07-23 MED ORDER — ONDANSETRON HCL 4 MG/2ML IJ SOLN
4.0000 mg | INTRAMUSCULAR | Status: AC
  Administered 2018-07-23: 4 mg via INTRAVENOUS
  Filled 2018-07-23: qty 2

## 2018-07-23 NOTE — ED Provider Notes (Signed)
[facility] Emergency Department Provider Note  ____________________________________________   First MD Initiated Contact with Patient 07/23/18 [phone]     (approximate)  I have reviewed the triage vital signs and the nursing notes.   HISTORY  Chief Complaint Headache; Emesis; and Cough    HPI Joel Page is a 47 y.o. male with medical history as listed below which includes the strong possibility of COPD without a definitive diagnosis.  He presents by private vehicle, ambulatory without difficulty, for evaluation of cough, shortness of breath, rapid heart rate, and general malaise.  He states that he started feeling ill earlier today and then developed a cough in the evening which has persisted and been severe.  He does state that sometimes when he drinks, he thinks that he inhales a little bit of alcohol and he will have a coughing episode, but tonight is much more severe.  The cough is occasionally productive of yellowish sputum.  He denies recent fever/chills but he has had some nasal congestion and runny nose recently.  He has shortness of breath associated with a cough.  He denies chest pain and abdominal pain although his muscles are starting to become sore from the persistent coughing.  His symptoms are severe and nothing particular makes them better or worse.  He has a "rescue inhaler" at home but has not used it and does not typically use it.  He continues to smoke daily.  Past Medical History:  Diagnosis Date  . Allergy   . DDD (degenerative disc disease)   . Diabetes mellitus   . Insomnia   . Lumbar stenosis   . 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 Hyperlipidemia LDL goal <100 07/04/2016  . Obesity 06/29/2016  . Back pain at L4-L5 level 04/07/2016  . Cough 03/30/2016  . Medication monitoring encounter 03/30/2016  . COPD suggested by initial evaluation  ([place]) 12/22/2015  . 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  . Diabetes mellitus type 2, uncontrolled ([place]) 05/11/2015  . Current smoker 05/11/2015  . Hemorrhoids, internal 05/11/2015  . Arthralgia of hip 05/11/2015  . 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  . Restless leg 05/11/2015  . Trochanteric bursitis of right hip 06/17/2013  . Sciatica 03/19/2012  . Lumbar degenerative disc disease 03/19/2012    Past Surgical History:  Procedure Laterality Date  . EXCISIONAL HEMORRHOIDECTOMY  2012  . ORIF FEMUR FRACTURE  1989  . SPHINCTEROTOMY    . TOOTH EXTRACTION      Prior to Admission medications   Medication Sig Start Date End Date Taking? Authorizing Provider  albuterol (PROVENTIL HFA;VENTOLIN HFA) 108 (90 Base) MCG/ACT inhaler Inhale 2-4 puffs by mouth every 4 hours as needed for wheezing, cough, and/or shortness of breath 07/23/18   Hinda Kehr, MD  atorvastatin (LIPITOR) 20 MG tablet Take 1 tablet (20 mg total) by mouth at bedtime. 07/12/18   Arnetha Courser, MD  Blood Glucose Monitoring Suppl (BAYER CONTOUR NEXT MONITOR) w/Device KIT 1 Device by Does not apply route 1 day or 1 dose. DX: DM E11.5 12/22/15   Bobetta Lime, MD  Cinnamon 500 MG capsule Take 1,000 mg by mouth daily.    [provider]  Echinacea 400 MG CAPS Take 2 capsules by mouth.    [provider]  metFORMIN (GLUCOPHAGE XR) 500 MG 24 hr tablet One by mouth daily for one week,  then two daily 07/12/18   Lada, Satira Anis, MD  Omega-3 Fatty Acids (FISH OIL) 1000 MG CAPS Take by mouth.    [provider]    Allergies Topiramate; Trazodone; Baclofen; and Diclofenac sodium  Family History  Problem Relation Age of Onset  . Lung cancer Mother   . Diabetes Mother   . Dementia Father   . COPD Father   . Diabetes Brother     Social History Social History   Tobacco Use  . Smoking status: Current Some Day Smoker    Packs/day: 1.50    Types: Cigarettes  .  Smokeless tobacco: Current User  Substance Use Topics  . Alcohol use: Yes    Alcohol/week: 0.0 standard drinks  . Drug use: Not on file    Review of Systems Constitutional: No fever/chills.  General malaise Eyes: No visual changes. ENT: Some nasal congestion/runny nose.  No sore throat. Cardiovascular: Denies chest pain. Respiratory: Productive cough with some associated shortness of breath as described above Gastrointestinal: No abdominal pain.  No nausea, no vomiting.  No diarrhea.  No constipation. Genitourinary: Negative for dysuria. Musculoskeletal: Negative for neck pain.  Negative for back pain. Integumentary: Negative for rash. Neurological: Negative for headaches, focal weakness or numbness.   ____________________________________________   PHYSICAL EXAM:  VITAL SIGNS: ED Triage Vitals  Enc Vitals Group     BP 07/23/18 0207 127/75     Pulse Rate 07/23/18 0207 (!) 112     Resp 07/23/18 0207 (!) 22     Temp 07/23/18 0207 98.7 F (37.1 C)     Temp Source 07/23/18 0207 Oral     SpO2 07/23/18 0207 97 %     Weight 07/23/18 0207 102.5 kg (226 lb)     Height 07/23/18 0207 1.753 m (5' 9" )     Head Circumference --      Peak Flow --      Pain Score 07/23/18 0205 8     Pain Loc --      Pain Edu? --      Excl. in [place]? --     Constitutional: Alert and oriented.  Generally well-appearing, initially in respiratory distress with cough but it has resolved after nebulizer treatment. Eyes: Conjunctivae are normal.  Head: Atraumatic. Nose: No congestion/rhinnorhea. Mouth/Throat: Mucous membranes are moist. Neck: No stridor.  No meningeal signs.   Cardiovascular: Normal rate, regular rhythm. Good peripheral circulation. Grossly normal heart sounds. Respiratory: Normal respiratory effort.  No retractions.  Mild expiratory wheezing most notable in the right upper lung field. Gastrointestinal: Soft and nontender. No distention.  Musculoskeletal: No lower extremity tenderness nor  edema. No gross deformities of extremities. Neurologic:  Normal speech and language. No gross focal neurologic deficits are appreciated.  Skin:  Skin is warm, dry and intact. No rash noted. Psychiatric: Mood and affect are normal. Speech and behavior are normal.  ____________________________________________   LABS (all labs ordered are listed, but only abnormal results are displayed)  Labs Reviewed  CBC WITH DIFFERENTIAL/PLATELET - Abnormal; Notable for the following components:      Result Value   WBC 14.6 (*)    RDW 15.6 (*)    Neutro Abs 11.1 (*)    Monocytes Absolute 1.1 (*)    All other components within normal limits  COMPREHENSIVE METABOLIC PANEL - Abnormal; Notable for the following components:   Glucose, Bld 210 (*)    Calcium 8.7 (*)    All other components within normal limits  LIPASE, BLOOD  TROPONIN I  BRAIN NATRIURETIC PEPTIDE  LACTIC ACID, PLASMA  LACTIC ACID, PLASMA   ____________________________________________  EKG  ED ECG REPORT I, Hinda Kehr, the attending physician, personally viewed and interpreted this ECG.  Date: 07/23/2018 EKG Time: 2:07 AM Rate: 104 Rhythm: Sinus tachycardia QRS Axis: Left axis deviation Intervals: normal ST/T Wave abnormalities: Non-specific ST segment / T-wave changes, but no evidence of acute ischemia. Narrative Interpretation: no evidence of acute ischemia   ____________________________________________  RADIOLOGY I, Hinda Kehr, personally viewed and evaluated these images (plain radiographs) as part of my medical decision making, as well as reviewing the written report by the radiologist.  ED MD interpretation: No acute infection or evidence of pulmonary edema on chest x-ray  Official radiology report(s): Dg Chest 2 View  Result Date: 07/23/2018 CLINICAL DATA:  47 year old male with chest pain. EXAM: CHEST - 2 VIEW COMPARISON:  Chest radiograph dated 07/28/2016 FINDINGS: The heart size and mediastinal contours  are within normal limits. Both lungs are clear. The visualized skeletal structures are unremarkable. IMPRESSION: No active cardiopulmonary disease. Electronically Signed   By: Anner Crete M.D.   On: 07/23/2018 02:20    ____________________________________________   PROCEDURES  Critical Care performed: No   Procedure(s) performed:   Procedures   ____________________________________________   INITIAL IMPRESSION / ASSESSMENT AND PLAN / ED COURSE  As part of my medical decision making, I reviewed the following data within the [place] notes reviewed and incorporated, Labs reviewed , EKG interpreted , Old chart reviewed, Radiograph reviewed  and Notes from prior ED visits    Differential diagnosis includes, but is not limited to, COPD/reactive airway disease exacerbation bronchitis, pneumonia, aspiration.  Initially the patient was coughing heavily and extensively but the cough resolved after a breathing treatment.  I provided Decadron 10 mg IV and a second breathing treatment.  The patient has been asymptomatic for a couple of hours.  He still reports some upper respiratory type symptoms such as nasal congestion, watery eyes, etc. but he is feeling much better than he did previously.  Vital signs been stable and his tachycardia has resolved.  No evidence of acute ischemia on EKG.  No acute finding on chest x-ray and all his lab work is within normal limits except for a mild leukocytosis.  Patient is comfortable with the plan for discharge and outpatient follow-up.  I gave him a prescription for another albuterol inhaler and encourage close follow-up and gave my usual customary return precautions.     ____________________________________________  FINAL CLINICAL IMPRESSION(S) / ED DIAGNOSES  Final diagnoses:  Cough  Bronchitis     MEDICATIONS GIVEN DURING THIS VISIT:  Medications  ondansetron (ZOFRAN) injection 4 mg (4 mg Intravenous Given 07/23/18  0343)  HYDROcodone-acetaminophen (NORCO/VICODIN) 5-325 MG per tablet 2 tablet (2 tablets Oral Given 07/23/18 0327)  ipratropium-albuterol (DUONEB) 0.5-2.5 (3) MG/3ML nebulizer solution (3 mLs  Given 07/23/18 0327)  dexamethasone (DECADRON) injection 10 mg (10 mg Intravenous Given 07/23/18 0433)  ipratropium-albuterol (DUONEB) 0.5-2.5 (3) MG/3ML nebulizer solution 3 mL (3 mLs Nebulization Given 07/23/18 0432)     ED Discharge Orders         Ordered    albuterol (PROVENTIL HFA;VENTOLIN HFA) 108 (90 Base) MCG/ACT inhaler     07/23/18 0544           Note:  This document was prepared using Dragon voice recognition software and may include unintentional dictation errors.    Hinda Kehr, MD 07/23/18 [phone]

## 2018-07-23 NOTE — Discharge Instructions (Addendum)

## 2018-07-23 NOTE — ED Triage Notes (Signed)
Patient ambulatory to triage with steady gait, without difficulty or distress noted; pt reports tonight having N/V, heart pounding, prod cough yellow sputum, generalized HA

## 2018-08-21 ENCOUNTER — Emergency Department: Payer: Self-pay

## 2018-08-21 ENCOUNTER — Emergency Department
Admission: EM | Admit: 2018-08-21 | Discharge: 2018-08-21 | Disposition: A | Discharge status: Home / Self Care | Payer: Self-pay | Attending: Emergency Medicine | Admitting: Emergency Medicine

## 2018-08-21 ENCOUNTER — Telehealth: Payer: Self-pay | Admitting: Family Medicine

## 2018-08-21 ENCOUNTER — Encounter: Payer: Self-pay | Admitting: Emergency Medicine

## 2018-08-21 ENCOUNTER — Other Ambulatory Visit: Payer: Self-pay

## 2018-08-21 DIAGNOSIS — Z7984 Long term (current) use of oral hypoglycemic drugs: Secondary | ICD-10-CM | POA: Insufficient documentation

## 2018-08-21 DIAGNOSIS — M791 Myalgia, unspecified site: Secondary | ICD-10-CM | POA: Insufficient documentation

## 2018-08-21 DIAGNOSIS — M7918 Myalgia, other site: Secondary | ICD-10-CM

## 2018-08-21 DIAGNOSIS — E119 Type 2 diabetes mellitus without complications: Secondary | ICD-10-CM | POA: Insufficient documentation

## 2018-08-21 DIAGNOSIS — S0340XA Sprain of jaw, unspecified side, initial encounter: Secondary | ICD-10-CM

## 2018-08-21 DIAGNOSIS — Z79899 Other long term (current) drug therapy: Secondary | ICD-10-CM | POA: Insufficient documentation

## 2018-08-21 DIAGNOSIS — Y9389 Activity, other specified: Secondary | ICD-10-CM | POA: Insufficient documentation

## 2018-08-21 DIAGNOSIS — S0341XA Sprain of jaw, right side, initial encounter: Secondary | ICD-10-CM | POA: Insufficient documentation

## 2018-08-21 DIAGNOSIS — X58XXXA Exposure to other specified factors, initial encounter: Secondary | ICD-10-CM | POA: Insufficient documentation

## 2018-08-21 DIAGNOSIS — Y929 Unspecified place or not applicable: Secondary | ICD-10-CM | POA: Insufficient documentation

## 2018-08-21 DIAGNOSIS — F17210 Nicotine dependence, cigarettes, uncomplicated: Secondary | ICD-10-CM | POA: Insufficient documentation

## 2018-08-21 DIAGNOSIS — Y999 Unspecified external cause status: Secondary | ICD-10-CM | POA: Insufficient documentation

## 2018-08-21 LAB — CBC WITH DIFFERENTIAL/PLATELET
BASOS ABS: 0.1 10*3/uL (ref 0–0.1)
Basophils Relative: 0 %
Eosinophils Absolute: 0.1 10*3/uL (ref 0–0.7)
Eosinophils Relative: 1 %
HEMATOCRIT: 41.9 % (ref 40.0–52.0)
Hemoglobin: 14.4 g/dL (ref 13.0–18.0)
LYMPHS PCT: 12 %
Lymphs Abs: 1.5 10*3/uL (ref 1.0–3.6)
MCH: 29.9 pg (ref 26.0–34.0)
MCHC: 34.4 g/dL (ref 32.0–36.0)
MCV: 86.9 fL (ref 80.0–100.0)
MONO ABS: 0.8 10*3/uL (ref 0.2–1.0)
MONOS PCT: 6 %
NEUTROS ABS: 10.4 10*3/uL — AB (ref 1.4–6.5)
Neutrophils Relative %: 81 %
Platelets: 172 10*3/uL (ref 150–440)
RBC: 4.82 MIL/uL (ref 4.40–5.90)
RDW: 15.4 % — AB (ref 11.5–14.5)
WBC: 12.9 10*3/uL — ABNORMAL HIGH (ref 3.8–10.6)

## 2018-08-21 LAB — URINALYSIS, COMPLETE (UACMP) WITH MICROSCOPIC
Bacteria, UA: NONE SEEN
Bilirubin Urine: NEGATIVE
Glucose, UA: 50 mg/dL — AB
Hgb urine dipstick: NEGATIVE
KETONES UR: NEGATIVE mg/dL
LEUKOCYTES UA: NEGATIVE
Nitrite: NEGATIVE
PH: 6 (ref 5.0–8.0)
Protein, ur: NEGATIVE mg/dL
Specific Gravity, Urine: 1.016 (ref 1.005–1.030)

## 2018-08-21 LAB — BASIC METABOLIC PANEL
ANION GAP: 9 (ref 5–15)
BUN: 14 mg/dL (ref 6–20)
CO2: 23 mmol/L (ref 22–32)
Calcium: 8.3 mg/dL — ABNORMAL LOW (ref 8.9–10.3)
Chloride: 105 mmol/L (ref 98–111)
Creatinine, Ser: 0.8 mg/dL (ref 0.61–1.24)
GFR calc Af Amer: >60 mL/min (ref >60)
GFR calc non Af Amer: >60 mL/min (ref >60)
GLUCOSE: 273 mg/dL — AB (ref 70–99)
POTASSIUM: 4.5 mmol/L (ref 3.5–5.1)
Sodium: 137 mmol/L (ref 135–145)

## 2018-08-21 LAB — GLUCOSE, CAPILLARY: Glucose-Capillary: 287 mg/dL — ABNORMAL HIGH (ref 70–99)

## 2018-08-21 MED ORDER — CYCLOBENZAPRINE HCL 10 MG PO TABS
10.0000 mg | ORAL_TABLET | Freq: Once | ORAL | Status: AC
  Administered 2018-08-21: 10 mg via ORAL
  Filled 2018-08-21: qty 1

## 2018-08-21 MED ORDER — OXYCODONE-ACETAMINOPHEN 5-325 MG PO TABS
1.0000 | ORAL_TABLET | Freq: Once | ORAL | Status: AC
  Administered 2018-08-21: 1 via ORAL
  Filled 2018-08-21: qty 1

## 2018-08-21 MED ORDER — BENZONATATE 100 MG PO CAPS: 200.0000 mg | ORAL_CAPSULE | Freq: Three times a day (TID) | ORAL | 0 refills | Status: DC | PRN

## 2018-08-21 MED ORDER — CYCLOBENZAPRINE HCL 10 MG PO TABS: 10.0000 mg | ORAL_TABLET | Freq: Three times a day (TID) | ORAL | 0 refills | Status: DC | PRN

## 2018-08-21 MED ORDER — OXYCODONE-ACETAMINOPHEN 7.5-325 MG PO TABS: 1.0000 | ORAL_TABLET | Freq: Four times a day (QID) | ORAL | 0 refills | Status: DC | PRN

## 2018-08-21 NOTE — ED Notes (Signed)
See triage note  States he had a sore throat a few days ago  Now having pain to right side of jaw into right ear   States pain  Seems to be shooting into back  Denies any trauma, fever or dental pain

## 2018-08-21 NOTE — Discharge Instructions (Signed)
Follow discharge care instructions.  Reestablish care with your PCP for your chronic conditions of diabetes and DJD.  Restart metformin today.  Discussed your TMJ complaint with your family doctor to consider consult to maxillofacial surgeon.

## 2018-08-21 NOTE — ED Triage Notes (Signed)
Pt c/o of facial/jaw pain and swelling started at 0530. Pt states pain radiates to lower back. Pt denies taking any medications for back pain.

## 2018-08-21 NOTE — Telephone Encounter (Signed)
Copied from [place] [id number]. Topic: Quick Communication - See Telephone Encounter >> Aug 21, 2018 11:30 AM Rutherford Nail, NT wrote: CRM for notification. See Telephone encounter for: 08/21/18. Patient requesting to speak with Crystal regarding a follow up appointment. Crystal unavailable at this time. Offered patient first available (08/23/18 at 9am). Unable to do that at this time. Would have to check with employer to see when he could get time off to come. Will call back with that information.

## 2018-08-21 NOTE — ED Provider Notes (Signed)
[facility] Emergency Department Provider Note   ____________________________________________   First MD Initiated Contact with Patient 08/21/18 [phone]     (approximate)  I have reviewed the triage vital signs and the nursing notes.   HISTORY  Chief Complaint Facial Pain    HPI Joel Page is a 47 y.o. male patient presents with multiple complaints today.  Patient awakened with facial/jaw pain and swelling this morning.  Patient has a history of grinding his teeth.   Patient also states he has trouble tolerating solid foods.  Patient state swallow complaint has persisted over a month.  Patient state nonproductive cough for 4 days.  Patient relates a history of heavy tobacco use.  Patient also type II diabetic with noncompliance of medication for over 2 months.  Patient stated noncompliance is secondary to lack of insurance.  Patient state he has insurance and will pick up medication today.  Patient also complained of urinary frequency and left flank pain.  Patient said he has a family history of kidney stones.  Patient rates his jaw pain as a 6/10.  Patient described pain is "achy".  No palliative measure for complaint.  Past Medical History:  Diagnosis Date  . Allergy   . DDD (degenerative disc disease)   . Diabetes mellitus   . Insomnia   . Lumbar stenosis   . Retinopathy due to secondary diabetes ([place]) 06/24/12   both eyes    Patient Active Problem List   Diagnosis Date Noted  . Hyperlipidemia LDL goal <100 07/04/2016  . Obesity 06/29/2016  . Back pain at L4-L5 level 04/07/2016  . Cough 03/30/2016  . Medication monitoring encounter 03/30/2016  . COPD suggested by initial evaluation ([place]) 12/22/2015  . 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  . Diabetes mellitus type 2, uncontrolled ([place]) 05/11/2015  . Current smoker 05/11/2015  . Hemorrhoids, internal 05/11/2015  . Arthralgia of hip 05/11/2015  . Degenerative joint disease  (DJD) of lumbar spine 05/11/2015  . Restless leg 05/11/2015  . Trochanteric bursitis of right hip 06/17/2013  . Sciatica 03/19/2012  . Lumbar degenerative disc disease 03/19/2012    Past Surgical History:  Procedure Laterality Date  . EXCISIONAL HEMORRHOIDECTOMY  2012  . ORIF FEMUR FRACTURE  1989  . SPHINCTEROTOMY    . TOOTH EXTRACTION      Prior to Admission medications   Medication Sig Start Date End Date Taking? Authorizing Provider  albuterol (PROVENTIL HFA;VENTOLIN HFA) 108 (90 Base) MCG/ACT inhaler Inhale 2-4 puffs by mouth every 4 hours as needed for wheezing, cough, and/or shortness of breath 07/23/18   Hinda Kehr, MD  atorvastatin (LIPITOR) 20 MG tablet Take 1 tablet (20 mg total) by mouth at bedtime. 07/12/18   Lada, Satira Anis, MD  benzonatate (TESSALON PERLES) 100 MG capsule Take 2 capsules (200 mg total) by mouth 3 (three) times daily as needed. 08/21/18 08/21/19  Sable Feil, PA-C  Blood Glucose Monitoring Suppl (BAYER CONTOUR NEXT MONITOR) w/Device KIT 1 Device by Does not apply route 1 day or 1 dose. DX: DM E11.5 12/22/15   Bobetta Lime, MD  Cinnamon 500 MG capsule Take 1,000 mg by mouth daily.    [provider]  cyclobenzaprine (FLEXERIL) 10 MG tablet Take 1 tablet (10 mg total) by mouth 3 (three) times daily as needed. 08/21/18   Sable Feil, PA-C  Echinacea 400 MG CAPS Take 2 capsules by mouth.    [provider]  metFORMIN (GLUCOPHAGE XR) 500 MG 24 hr  tablet One by mouth daily for one week, then two daily 07/12/18   Lada, Satira Anis, MD  Omega-3 Fatty Acids (FISH OIL) 1000 MG CAPS Take by mouth.    [provider]  oxyCODONE-acetaminophen (PERCOCET) 7.5-325 MG tablet Take 1 tablet by mouth every 6 (six) hours as needed. 08/21/18   Sable Feil, PA-C    Allergies Topiramate; Trazodone; Baclofen; and Diclofenac sodium  Family History  Problem Relation Age of Onset  . Lung cancer Mother   . Diabetes Mother   . Dementia Father   .  COPD Father   . Diabetes Brother     Social History Social History   Tobacco Use  . Smoking status: Current Some Day Smoker    Packs/day: 1.50    Types: Cigarettes  . Smokeless tobacco: Current User  Substance Use Topics  . Alcohol use: Yes    Alcohol/week: 0.0 standard drinks  . Drug use: Not on file    Review of Systems Constitutional: No fever/chills Eyes: No visual changes. ENT: No sore throat.  TMJ pain. Cardiovascular: Denies chest pain. Respiratory: Denies shortness of breath. Gastrointestinal: Trouble swallowing solid foods.  No abdominal pain.  No nausea, no vomiting.  No diarrhea.  No constipation. Genitourinary: Negative for dysuria. Musculoskeletal: Back and neck pain. Skin: Negative for rash. Neurological: Negative for headaches, focal weakness or numbness. Endocrine:Diabetes and hyperlipidemia. Hematological/Lymphatic: Allergic/Immunilogical: The medication list. ____________________________________________   PHYSICAL EXAM:  VITAL SIGNS: ED Triage Vitals  Enc Vitals Group     BP 08/21/18 0721 124/76     Pulse Rate 08/21/18 0721 90     Resp --      Temp 08/21/18 0721 98.5 F (36.9 C)     Temp Source 08/21/18 0721 Oral     SpO2 08/21/18 0721 96 %     Weight 08/21/18 0723 226 lb (102.5 kg)     Height 08/21/18 0723 5' 9"  (1.753 m)     Head Circumference --      Peak Flow --      Pain Score 08/21/18 0721 6     Pain Loc --      Pain Edu? --      Excl. in [place]? --    Constitutional: Alert and oriented. Well appearing and in no acute distress. Eyes: Conjunctivae are normal. PERRL. EOMI. Head: Atraumatic. Nose: No congestion/rhinnorhea. Mouth/Throat: Mucous membranes are moist.  Oropharynx non-erythematous.  Decreased range of motion and opening of the mouth. Neck: No stridor.  No cervical spine tenderness to palpation. Hematological/Lymphatic/Immunilogical: No cervical lymphadenopathy. Cardiovascular: Normal rate, regular rhythm. Grossly normal  heart sounds.  Good peripheral circulation. Respiratory: Normal respiratory effort.  No retractions. Lungs inspiratory rales.Gastrointestinal: Soft and nontender. No distention. No abdominal bruits. No CVA tenderness. Musculoskeletal: No lower extremity tenderness nor edema.  No joint effusions. Neurologic:  Normal speech and language. No gross focal neurologic deficits are appreciated. No gait instability. Skin:  Skin is warm, dry and intact. No rash noted. Psychiatric: Mood and affect are normal. Speech and behavior are normal.  ____________________________________________   LABS (all labs ordered are listed, but only abnormal results are displayed)  Labs Reviewed  GLUCOSE, CAPILLARY - Abnormal; Notable for the following components:      Result Value   Glucose-Capillary 287 (*)    All other components within normal limits  BASIC METABOLIC PANEL - Abnormal; Notable for the following components:   Glucose, Bld 273 (*)    Calcium 8.3 (*)    All other  components within normal limits  CBC WITH DIFFERENTIAL/PLATELET - Abnormal; Notable for the following components:   WBC 12.9 (*)    RDW 15.4 (*)    Neutro Abs 10.4 (*)    All other components within normal limits  URINALYSIS, COMPLETE (UACMP) WITH MICROSCOPIC - Abnormal; Notable for the following components:   Color, Urine YELLOW (*)    APPearance CLEAR (*)    Glucose, UA 50 (*)    All other components within normal limits  CBG MONITORING, ED   ____________________________________________  EKG   ____________________________________________  RADIOLOGY  ED MD interpretation:    Official radiology report(s): Dg Neck Soft Tissue  Result Date: 08/21/2018 CLINICAL DATA:  47 year old male with sore throat for several days. Right side jaw pain now radiating to the ear. Denies dental pain. EXAM: NECK SOFT TISSUES - 1+ VIEW COMPARISON:  Head CTs without contrast 07/28/2016 and earlier. FINDINGS: Normal prevertebral soft tissue  contour. Normal pharyngeal soft tissue contours. Normal epiglottis. Visualized tracheal air column is within normal limits. Straightening of cervical lordosis with widespread cervical endplate degeneration. Negative visible upper chest. IMPRESSION: Normal pharyngeal and neck soft tissue contours. Electronically Signed   By: Genevie Ann M.D.   On: 08/21/2018 08:55   Dg Chest 2 View  Result Date: 08/21/2018 CLINICAL DATA:  Right jaw pain.  Sore throat EXAM: CHEST - 2 VIEW COMPARISON:  07/23/2018 FINDINGS: Mild peribronchial thickening. Heart and mediastinal contours are within normal limits. No focal opacities or effusions. No acute bony abnormality. IMPRESSION: Mild bronchitic changes. Electronically Signed   By: Rolm Baptise M.D.   On: 08/21/2018 08:53    ____________________________________________   PROCEDURES  Procedure(s) performed:   Procedures  Critical Care performed:   ____________________________________________   INITIAL IMPRESSION / ASSESSMENT AND PLAN / ED COURSE  As part of my medical decision making, I reviewed the following data within the [facility]    Patient presents with right TMJ pain and dysphagia.  Patient also complaining of a nonproductive cough, neck and back pain.  Discussed negative soft tissue x-rays of the neck.  Discussed chest x-ray findings consistent with bronchitis.  Patient given discharge care instructions and advised to follow-up PCP for continued care.  Patient advised to restart metformin today.      ____________________________________________   FINAL CLINICAL IMPRESSION(S) / ED DIAGNOSES  Final diagnoses:  TMJ (sprain of temporomandibular joint), initial encounter  Musculoskeletal pain     ED Discharge Orders         Ordered    oxyCODONE-acetaminophen (PERCOCET) 7.5-325 MG tablet  Every 6 hours PRN     08/21/18 0941    cyclobenzaprine (FLEXERIL) 10 MG tablet  3 times daily PRN     08/21/18 0941    benzonatate  (TESSALON PERLES) 100 MG capsule  3 times daily PRN     08/21/18 0951           Note:  This document was prepared using Dragon voice recognition software and may include unintentional dictation errors.    Sable Feil, PA-C 08/21/18 3557    Carrie Mew, MD 08/24/18 [phone]

## 2018-08-22 ENCOUNTER — Ambulatory Visit: Payer: Self-pay | Admitting: Gastroenterology

## 2018-08-22 ENCOUNTER — Encounter (INDEPENDENT_AMBULATORY_CARE_PROVIDER_SITE_OTHER): Payer: Self-pay

## 2018-10-31 ENCOUNTER — Telehealth: Payer: Self-pay | Admitting: Family Medicine

## 2018-10-31 NOTE — Telephone Encounter (Signed)
Pt would like for the medicine to go to  [id number] - [place], [place] - [address] [phone] (Phone) [phone] (Fax)   And not to [name].

## 2018-11-02 NOTE — Telephone Encounter (Signed)
Patient cannot have been compliant with medicine; this metformin was a one month taper up supply from August I'll approve one a day since I know he cannot be taking two a day He needs an appointment please ASAP Overdue for visit

## 2018-11-04 NOTE — Telephone Encounter (Signed)
appt made for 12.24.19

## 2018-11-12 ENCOUNTER — Ambulatory Visit: Payer: Self-pay | Admitting: Family Medicine

## 2018-12-05 ENCOUNTER — Ambulatory Visit: Payer: Self-pay | Admitting: Family Medicine

## 2018-12-05 ENCOUNTER — Encounter: Payer: Self-pay | Admitting: Family Medicine

## 2018-12-05 ENCOUNTER — Ambulatory Visit (INDEPENDENT_AMBULATORY_CARE_PROVIDER_SITE_OTHER): Payer: PRIVATE HEALTH INSURANCE | Admitting: Family Medicine

## 2018-12-05 VITALS — BP 122/84 | HR 85 | Temp 97.7°F | Resp 18 | Ht 69.0 in | Wt 238.0 lb

## 2018-12-05 DIAGNOSIS — E785 Hyperlipidemia, unspecified: Secondary | ICD-10-CM

## 2018-12-05 DIAGNOSIS — J441 Chronic obstructive pulmonary disease with (acute) exacerbation: Secondary | ICD-10-CM

## 2018-12-05 DIAGNOSIS — Z6833 Body mass index (BMI) 33.0-33.9, adult: Secondary | ICD-10-CM

## 2018-12-05 DIAGNOSIS — E1165 Type 2 diabetes mellitus with hyperglycemia: Secondary | ICD-10-CM

## 2018-12-05 DIAGNOSIS — E6609 Other obesity due to excess calories: Secondary | ICD-10-CM

## 2018-12-05 DIAGNOSIS — R059 Cough, unspecified: Secondary | ICD-10-CM

## 2018-12-05 DIAGNOSIS — F17200 Nicotine dependence, unspecified, uncomplicated: Secondary | ICD-10-CM

## 2018-12-05 DIAGNOSIS — R05 Cough: Secondary | ICD-10-CM

## 2018-12-05 MED ORDER — METFORMIN HCL ER 500 MG PO TB24: ORAL_TABLET | ORAL | 0 refills | Status: DC

## 2018-12-05 MED ORDER — ALBUTEROL SULFATE HFA 108 (90 BASE) MCG/ACT IN AERS: INHALATION_SPRAY | RESPIRATORY_TRACT | 1 refills | Status: DC

## 2018-12-05 MED ORDER — ATORVASTATIN CALCIUM 20 MG PO TABS: 20.0000 mg | ORAL_TABLET | Freq: Every day | ORAL | 1 refills | Status: DC

## 2018-12-05 MED ORDER — AZITHROMYCIN 250 MG PO TABS: ORAL_TABLET | ORAL | 0 refills | Status: DC

## 2018-12-05 NOTE — Addendum Note (Signed)
Addended by: Saunders Glance A on: 12/05/2018 01:53 PM   Modules accepted: Orders

## 2018-12-05 NOTE — Progress Notes (Signed)
Name: Joel Page   MRN: [id number]    DOB: 12/20/1970   Date:12/05/2018       Progress Note  Subjective  Chief Complaint  Chief Complaint  Patient presents with  . Diabetes    follow up, medication refills  . Hyperlipidemia  . Cough    til he almost passed out, congested for 2 weeks    HPI  Obesity: Body mass index is 35.15 kg/m. 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 Diet: Balanced, taking supplements Exercise: moderately active and not exercising outside of work Co-Morbid Conditions: diabetes mellitus, dyslipidemias and hypertension; 2 or more of these conditions combined with BMI >30 is considered morbid obesity; is this diagnosis appropriate and/or added to patient's problem list? Yes  Diabetes mellitus type 2 Checking sugars?  yes How often? Daily  Range (low to high) over last two weeks:  Never above 130 Does patient feel additional teaching/training would be helpful?  no  Have they attended Diabetes education classes? no  Trying to limit white bread, white rice, white potatoes, sweets?  yes Trying to limit sweetened drinks like iced tea, soft drinks, sports drinks, fruit juices?  yes Checking feet every day/night?  no - education is provided.  Last eye exam:  Due for one this year.  Wants to check on his insurance for this.  Denies: Polyuria, polydipsia, polyphagia, vision changes, or neuropathy. Most recent A1C:  Lab Results  Component Value Date   HGBA1C 6.7 (A) 07/09/2018   HGBA1C 6.7 07/09/2018   HGBA1C 0 (A) 07/09/2018   HGBA1C 0.0 07/09/2018    We will recheck today. Last CMP Results : is not due for repeat today Urine Micro UTD? Yes Current Medication Management: Diabetic Medications: Metformin 546m XR - ran out, and states it was expensive without insurance ACEI/ARB: Not Indicated Statin: No, due to cost Aspirin therapy: Yes, when he remembers to  take.  Hyperlipidemia: Current Medication Regimen: [facility] we will print coupon from [facility]: Lab Results  Component Value Date   CHOL 224 (H) 07/09/2018   HDL 40 (L) 07/09/2018   LDLCALC 165 (H) 07/09/2018   TRIG 84 07/09/2018   CHOLHDL 5.6 (H) 07/09/2018   - Denies: Chest pain, myalgias.  Endorses shortness of breath - recent URI, has cough - see below.  Cough/COPD Exacerbation/Smoker: Worse when he lays down - cough has been productive and has caused bronchospasm a few times over the last 2 weeks. Has albuterol inhaler, but only uses seldomly due to cost.   Does endorse some wheezing intermittently.  He does feel like he is improving with his cough.  Current smoker - trying to quit - down from 2ppd to 1ppd - he is using the patch to help him quit (uses when he's at work).  Patient Active Problem List   Diagnosis Date Noted  . Hyperlipidemia LDL goal <100 07/04/2016  . Obesity 06/29/2016  . Back pain at L4-L5 level 04/07/2016  . Cough 03/30/2016  . Medication monitoring encounter 03/30/2016  . [place] ([place] 12/22/2015  . 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  . Diabetes mellitus type 2, uncontrolled ([place] 05/11/2015  . Current smoker 05/11/2015  . Hemorrhoids, internal 05/11/2015  . Arthralgia of hip 05/11/2015  . 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  . Restless leg 05/11/2015  .  Trochanteric bursitis of right hip 06/17/2013  . Sciatica 03/19/2012  . Lumbar degenerative disc disease 03/19/2012    Past Surgical History:  Procedure Laterality Date  . EXCISIONAL HEMORRHOIDECTOMY  2012  . ORIF FEMUR FRACTURE  1989  . SPHINCTEROTOMY    . TOOTH EXTRACTION      Family History  Problem Relation Age of Onset  . Lung cancer Mother   . Diabetes Mother   . Dementia Father   . COPD Father   . Diabetes Brother     Social  History   Socioeconomic History  . Marital status: Single    Spouse name: Not on file  . Number of children: Not on file  . Years of education: Not on file  . Highest education level: Not on file  Occupational History    Employer: [employer]  Social Needs  . Financial resource strain: Not on file  . Food insecurity:    Worry: Not on file    Inability: Not on file  . Transportation needs:    Medical: Not on file    Non-medical: Not on file  Tobacco Use  . Smoking status: Current Some Day Smoker    Packs/day: 1.50    Types: Cigarettes  . Smokeless tobacco: Current User  Substance and Sexual Activity  . Alcohol use: Yes    Alcohol/week: 0.0 standard drinks  . Drug use: Not on file  . Sexual activity: Yes  Lifestyle  . Physical activity:    Days per week: Not on file    Minutes per session: Not on file  . Stress: Not on file  Relationships  . Social connections:    Talks on phone: Not on file    Gets together: Not on file    Attends religious service: Not on file    Active member of club or organization: Not on file    Attends meetings of clubs or organizations: Not on file    Relationship status: Not on file  . Intimate partner violence:    Fear of current or ex partner: Not on file    Emotionally abused: Not on file    Physically abused: Not on file    Forced sexual activity: Not on file  Other Topics Concern  . Not on file  Social History Narrative  . Not on file     Current Outpatient Medications:  .  albuterol (PROVENTIL HFA;VENTOLIN HFA) 108 (90 Base) MCG/ACT inhaler, Inhale 2-4 puffs by mouth every 4 hours as needed for wheezing, cough, and/or shortness of breath, Disp: 1 Inhaler, Rfl: 1 .  Blood Glucose Monitoring Suppl (BAYER CONTOUR NEXT MONITOR) w/Device KIT, 1 Device by Does not apply route 1 day or 1 dose. DX: DM E11.5, Disp: 1 kit, Rfl: 0 .  Cinnamon 500 MG capsule, Take 1,000 mg by mouth daily., Disp: , Rfl:  .  Echinacea 400 MG CAPS, Take  2 capsules by mouth., Disp: , Rfl:  .  metFORMIN (GLUCOPHAGE-XR) 500 MG 24 hr tablet, One by mouth daily; appointment needed, Disp: 30 tablet, Rfl: 0 .  Omega-3 Fatty Acids (FISH OIL) 1000 MG CAPS, Take by mouth., Disp: , Rfl:  .  atorvastatin (LIPITOR) 20 MG tablet, Take 1 tablet (20 mg total) by mouth at bedtime. (Patient not taking: Reported on 12/05/2018), Disp: 30 tablet, Rfl: 1 .  benzonatate (TESSALON PERLES) 100 MG capsule, Take 2 capsules (200 mg total) by mouth 3 (three) times daily as needed. (Patient not taking: Reported on 12/05/2018),  Disp: 30 capsule, Rfl: 0 .  cyclobenzaprine (FLEXERIL) 10 MG tablet, Take 1 tablet (10 mg total) by mouth 3 (three) times daily as needed. (Patient not taking: Reported on 12/05/2018), Disp: 15 tablet, Rfl: 0 .  oxyCODONE-acetaminophen (PERCOCET) 7.5-325 MG tablet, Take 1 tablet by mouth every 6 (six) hours as needed. (Patient not taking: Reported on 12/05/2018), Disp: 20 tablet, Rfl: 0  Allergies  Allergen Reactions  . Topiramate Other (See Comments)    White light in peripheral vision  . Trazodone Other (See Comments)    Makes tingling & numbness in hands & arms worse; causes an erection.  . Baclofen Itching  . Diclofenac Sodium Other (See Comments)    Raised blood sugar     I personally reviewed active problem list, medication list, allergies, notes from last encounter, lab results with the patient/caregiver today.   ROS Constitutional: Negative for fever or weight change.  Respiratory: See above  Cardiovascular: Negative for chest pain or palpitations.  Gastrointestinal: Negative for abdominal pain, no bowel changes.  Musculoskeletal: Negative for gait problem or joint swelling.  Skin: Negative for rash.  Neurological: Negative for dizziness or headache.  No other specific complaints in a complete review of systems (except as listed in HPI above).  Objective  Vitals:   12/05/18 1308  BP: 122/84  Pulse: 85  Resp: 18  Temp: 97.7 F  (36.5 C)  TempSrc: Oral  SpO2: 97%  Weight: 238 lb (108 kg)  Height: 5' 9"  (1.753 m)   Body mass index is 35.15 kg/m.  Physical Exam  Constitutional: Patient appears well-developed and well-nourished. No distress.  HENT: Head: Normocephalic and atraumatic.  Eyes: Conjunctivae and EOM are normal. No scleral icterus.  Neck: Normal range of motion. Neck supple. No JVD present. No thyromegaly present.  Cardiovascular: Normal rate, regular rhythm and normal heart sounds.  No murmur heard. No BLE edema. Pulmonary/Chest: Effort normal and breath sounds clear except for inspiratory and expiratory wheezing to the LUL, slight crackles to the LLL. No respiratory distress. Musculoskeletal: Normal range of motion, no joint effusions. No gross deformities Neurological: Pt is alert and oriented to person, place, and time. No cranial nerve deficit. Coordination, balance, strength, speech and gait are normal.  Skin: Skin is warm and dry. No rash noted. No erythema.  Psychiatric: Patient has a normal mood and affect. behavior is normal. Judgment and thought content normal.  No results found for this or any previous visit (from the past 72 hour(s)).  PHQ2/9: Depression screen [facility] 2/9 12/05/2018 07/09/2018 06/29/2016 04/21/2016 04/07/2016  Decreased Interest 0 0 0 0 0  Down, Depressed, Hopeless 0 0 0 0 0  PHQ - 2 Score 0 0 0 0 0  Altered sleeping 0 - - - -  Tired, decreased energy 0 - - - -  Change in appetite 0 - - - -  Feeling bad or failure about yourself  0 - - - -  Trouble concentrating 0 - - - -  Moving slowly or fidgety/restless 0 - - - -  Suicidal thoughts 0 - - - -  PHQ-9 Score 0 - - - -  Difficult doing work/chores Not difficult at all - - - -   Fall Risk: Fall Risk  12/05/2018 07/09/2018 06/29/2016 04/21/2016 04/07/2016  Falls in the past year? 0 Yes No No No  Number falls in past yr: 0 1 - - -  Injury with Fall? 0 Yes - - -  Risk for fall due to : Other (  Comment) - - - -    Assessment &  Plan  1. Uncontrolled type 2 diabetes mellitus with hyperglycemia (HCC) - Hemoglobin A1c - Lipid panel - metFORMIN (GLUCOPHAGE-XR) 500 MG 24 hr tablet; One by mouth daily; appointment needed  Dispense: 180 tablet; Refill: 0 - atorvastatin (LIPITOR) 20 MG tablet; Take 1 tablet (20 mg total) by mouth at bedtime.  Dispense: 90 tablet; Refill: 1  2. Class 1 obesity due to excess calories without serious comorbidity with body mass index (BMI) of 33.0 to 33.9 in adult - Discussed importance of 150 minutes of physical activity weekly, eat two servings of fish weekly, eat one serving of tree nuts ( cashews, pistachios, pecans, almonds.Marland Kitchen) every other day, eat 6 servings of fruit/vegetables daily and drink plenty of water and avoid sweet beverages.  - atorvastatin (LIPITOR) 20 MG tablet; Take 1 tablet (20 mg total) by mouth at bedtime.  Dispense: 90 tablet; Refill: 1  3. Morbid obesity ([place]) - See above  4. Hyperlipidemia LDL goal <100 - Lipid panel - atorvastatin (LIPITOR) 20 MG tablet; Take 1 tablet (20 mg total) by mouth at bedtime.  Dispense: 90 tablet; Refill: 1  5. COPD with acute exacerbation ([place]) - Will avoid prednisone due to DM. - azithromycin (ZITHROMAX) 250 MG tablet; Day1: Take 2 tabs; Days 2-5: Take 1 tab daily  Dispense: 6 tablet; Refill: 0 - albuterol (PROVENTIL HFA;VENTOLIN HFA) 108 (90 Base) MCG/ACT inhaler; Inhale 2-4 puffs by mouth every 4 hours as needed for wheezing, cough, and/or shortness of breath  Dispense: 1 Inhaler; Refill: 1  6. Current smoker - Advised Cessation - he is down to 1ppd (was smoking 2ppd.)  7. Cough - Albuterol; has tessalon left, will take PRN.

## 2018-12-06 LAB — LIPID PANEL
CHOL/HDL RATIO: 6.8 (calc) — AB (ref <5.0)
Cholesterol: 218 mg/dL — ABNORMAL HIGH (ref <200)
HDL: 32 mg/dL — AB (ref >40)
LDL Cholesterol (Calc): 144 mg/dL (calc) — ABNORMAL HIGH
NON-HDL CHOLESTEROL (CALC): 186 mg/dL — AB (ref <130)
TRIGLYCERIDES: 270 mg/dL — AB (ref <150)

## 2018-12-06 LAB — HEMOGLOBIN A1C
HEMOGLOBIN A1C: 7.5 %{Hb} — AB (ref <5.7)
Mean Plasma Glucose: 169 (calc)
eAG (mmol/L): 9.3 (calc)

## 2018-12-25 ENCOUNTER — Encounter: Payer: Self-pay | Admitting: Emergency Medicine

## 2018-12-25 ENCOUNTER — Emergency Department: Payer: No Typology Code available for payment source

## 2018-12-25 ENCOUNTER — Other Ambulatory Visit: Payer: Self-pay

## 2018-12-25 DIAGNOSIS — Z5321 Procedure and treatment not carried out due to patient leaving prior to being seen by health care provider: Secondary | ICD-10-CM | POA: Insufficient documentation

## 2018-12-25 DIAGNOSIS — R079 Chest pain, unspecified: Secondary | ICD-10-CM | POA: Insufficient documentation

## 2018-12-25 LAB — BASIC METABOLIC PANEL
Anion gap: 7 (ref 5–15)
BUN: 12 mg/dL (ref 6–20)
CO2: 24 mmol/L (ref 22–32)
Calcium: 8.1 mg/dL — ABNORMAL LOW (ref 8.9–10.3)
Chloride: 105 mmol/L (ref 98–111)
Creatinine, Ser: 0.92 mg/dL (ref 0.61–1.24)
GFR calc Af Amer: >60 mL/min (ref >60)
Glucose, Bld: 303 mg/dL — ABNORMAL HIGH (ref 70–99)
Potassium: 3.7 mmol/L (ref 3.5–5.1)
Sodium: 136 mmol/L (ref 135–145)

## 2018-12-25 LAB — TROPONIN I: Troponin I: <0.03 ng/mL (ref <0.03)

## 2018-12-25 LAB — CBC
HCT: 43.4 % (ref 39.0–52.0)
Hemoglobin: 14 g/dL (ref 13.0–17.0)
MCH: 28.5 pg (ref 26.0–34.0)
MCHC: 32.3 g/dL (ref 30.0–36.0)
MCV: 88.2 fL (ref 80.0–100.0)
Platelets: 177 10*3/uL (ref 150–400)
RBC: 4.92 MIL/uL (ref 4.22–5.81)
RDW: 14.4 % (ref 11.5–15.5)
WBC: 13.5 10*3/uL — ABNORMAL HIGH (ref 4.0–10.5)
nRBC: 0 % (ref 0.0–0.2)

## 2018-12-25 NOTE — ED Triage Notes (Signed)
Pt to triage via w/c, appears uncomfortable; Pt reports right lower CP radiating into back accomp by [facility] "all day"

## 2018-12-26 ENCOUNTER — Emergency Department
Admission: EM | Admit: 2018-12-26 | Discharge: 2018-12-26 | Discharge status: Against Medical Advice | Payer: No Typology Code available for payment source | Attending: Emergency Medicine | Admitting: Emergency Medicine

## 2018-12-26 ENCOUNTER — Telehealth: Payer: Self-pay | Admitting: Emergency Medicine

## 2018-12-26 NOTE — ED Notes (Signed)
Pt has not returned to ED lobby 

## 2018-12-26 NOTE — Telephone Encounter (Signed)
Called pt to see , how he was feeling and to see if he had sought out treatment for his complaint.   Pt did not answer , left message.

## 2018-12-26 NOTE — ED Notes (Signed)
Pt noted leaving ED lobby with SO after inquiring over wait time

## 2018-12-26 NOTE — ED Notes (Signed)
Pt has not returned to lobby 

## 2019-01-08 IMAGING — CR DG NECK SOFT TISSUE
1 series · 2 of 2 positions shown · non-contrast
Comparison: Head CTs without contrast 07/28/2016 and earlier.

CLINICAL DATA: 46-year-old male with sore throat for several days.
Right side jaw pain now radiating to the ear. Denies dental pain.

EXAM:
NECK SOFT TISSUES - 1+ VIEW

[Series 1: dg neck soft tissue · 0.14mm/px · 2 of 2 slices shown]
[im 1/2]
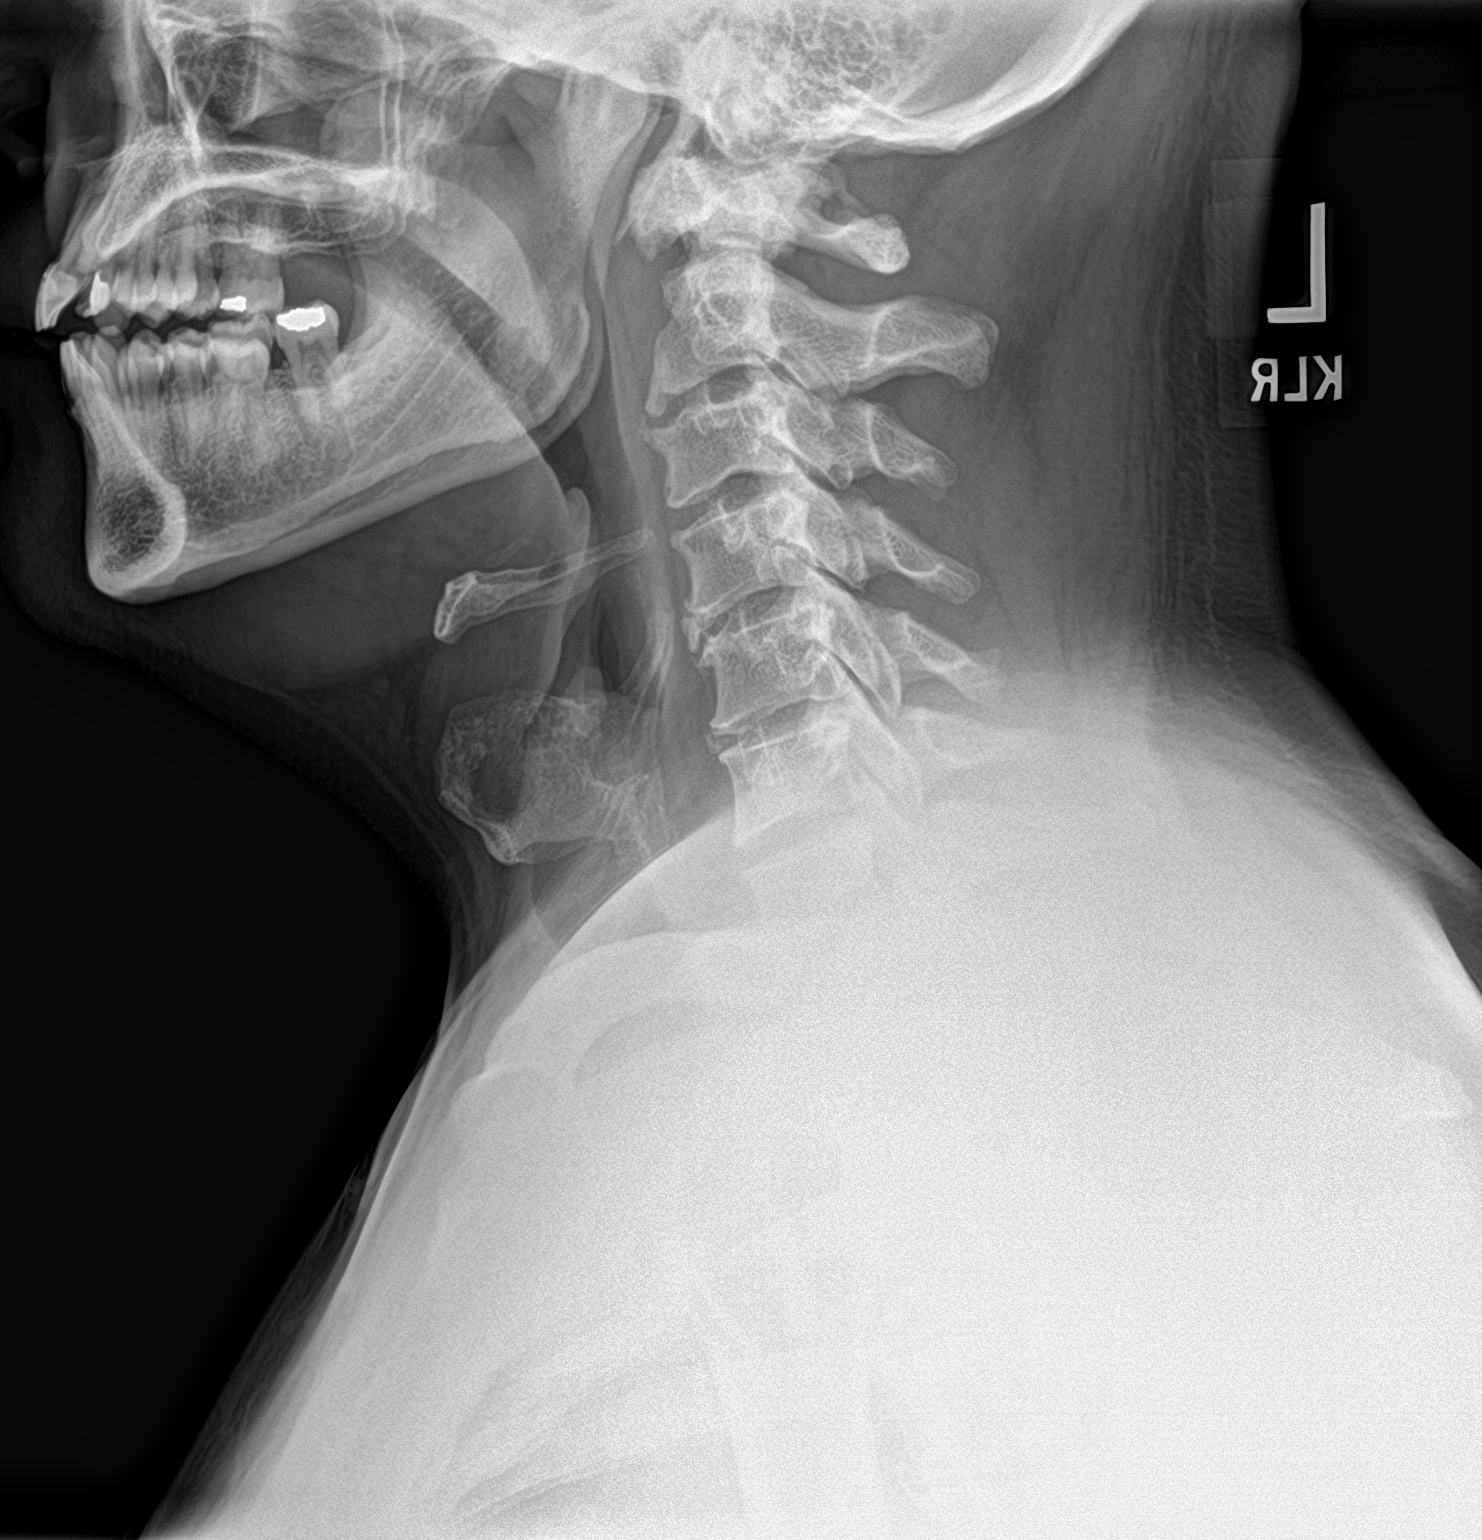
[im 2/2]
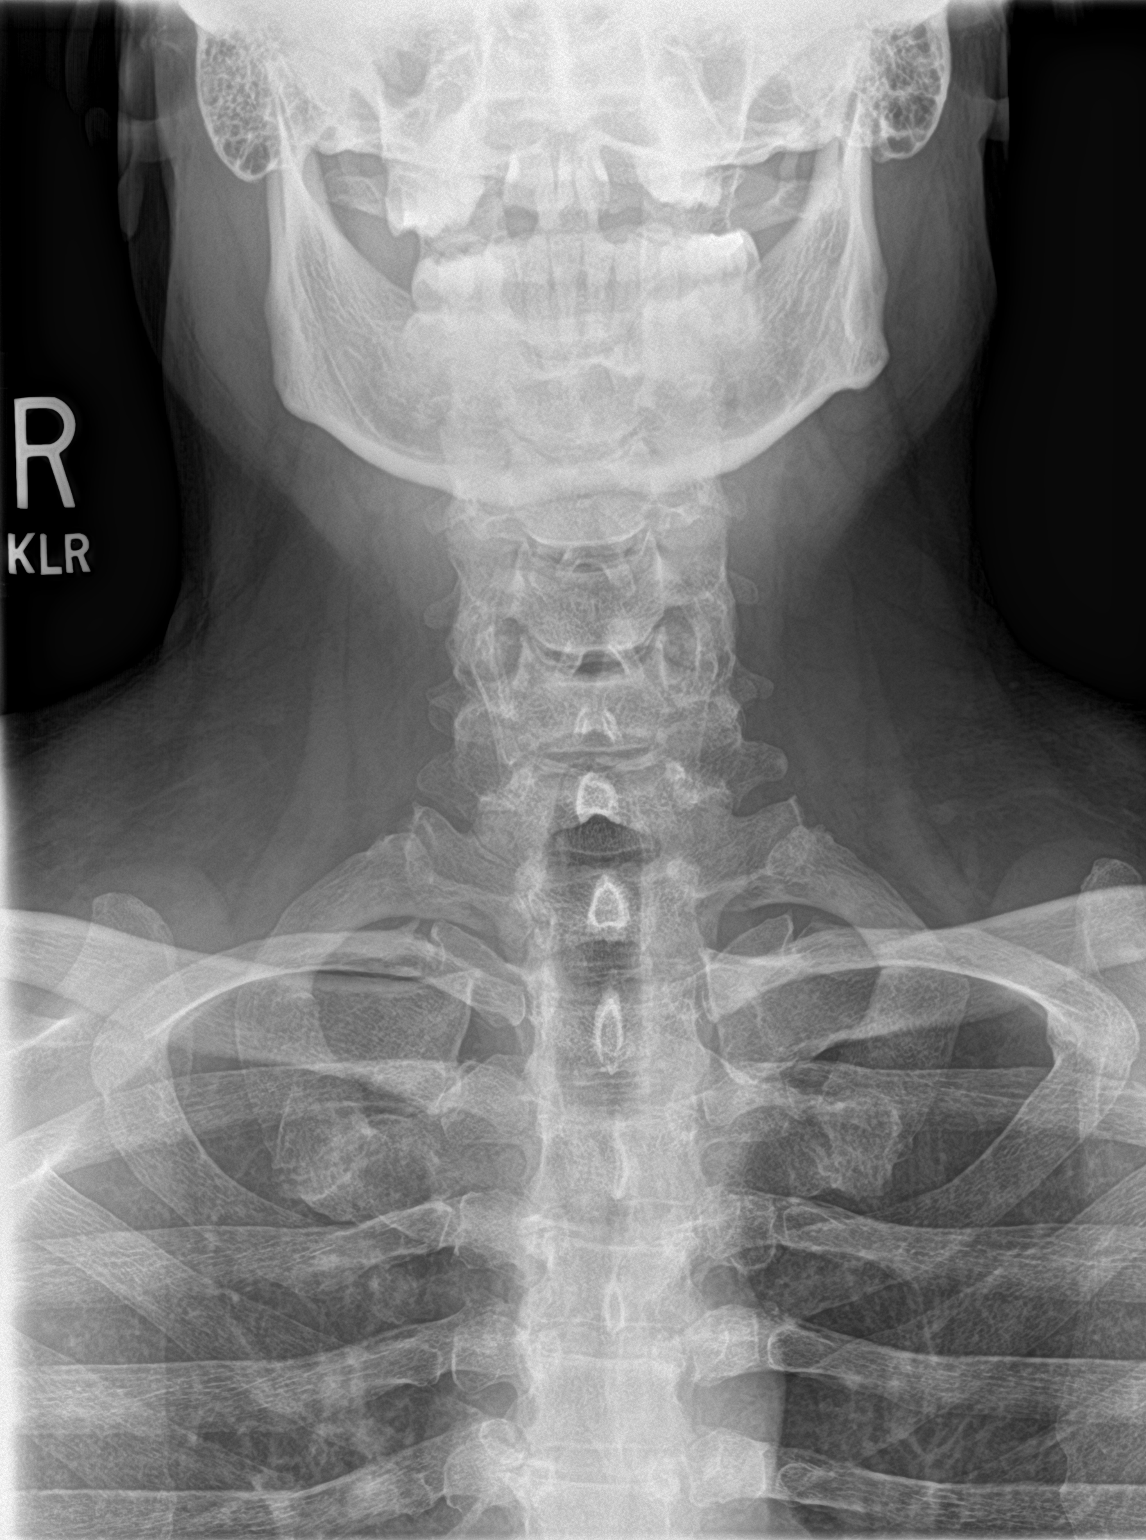

[2 of 2 positions shown; findings below may reference images not displayed]

FINDINGS: Normal prevertebral soft tissue contour. Normal pharyngeal soft
tissue contours. Normal epiglottis. Visualized tracheal air column
is within normal limits.

Straightening of cervical lordosis with widespread cervical endplate
degeneration. Negative visible upper chest.
IMPRESSION: Normal pharyngeal and neck soft tissue contours.

## 2019-01-08 IMAGING — CR DG CHEST 2V
1 series · 2 of 2 positions shown · non-contrast
Comparison: 07/23/2018

CLINICAL DATA: Right jaw pain.  Sore throat

EXAM:
CHEST - 2 VIEW

[Series 1: dg chest 2 view · 0.14mm/px · 2 of 2 slices shown]
[im 1/2]
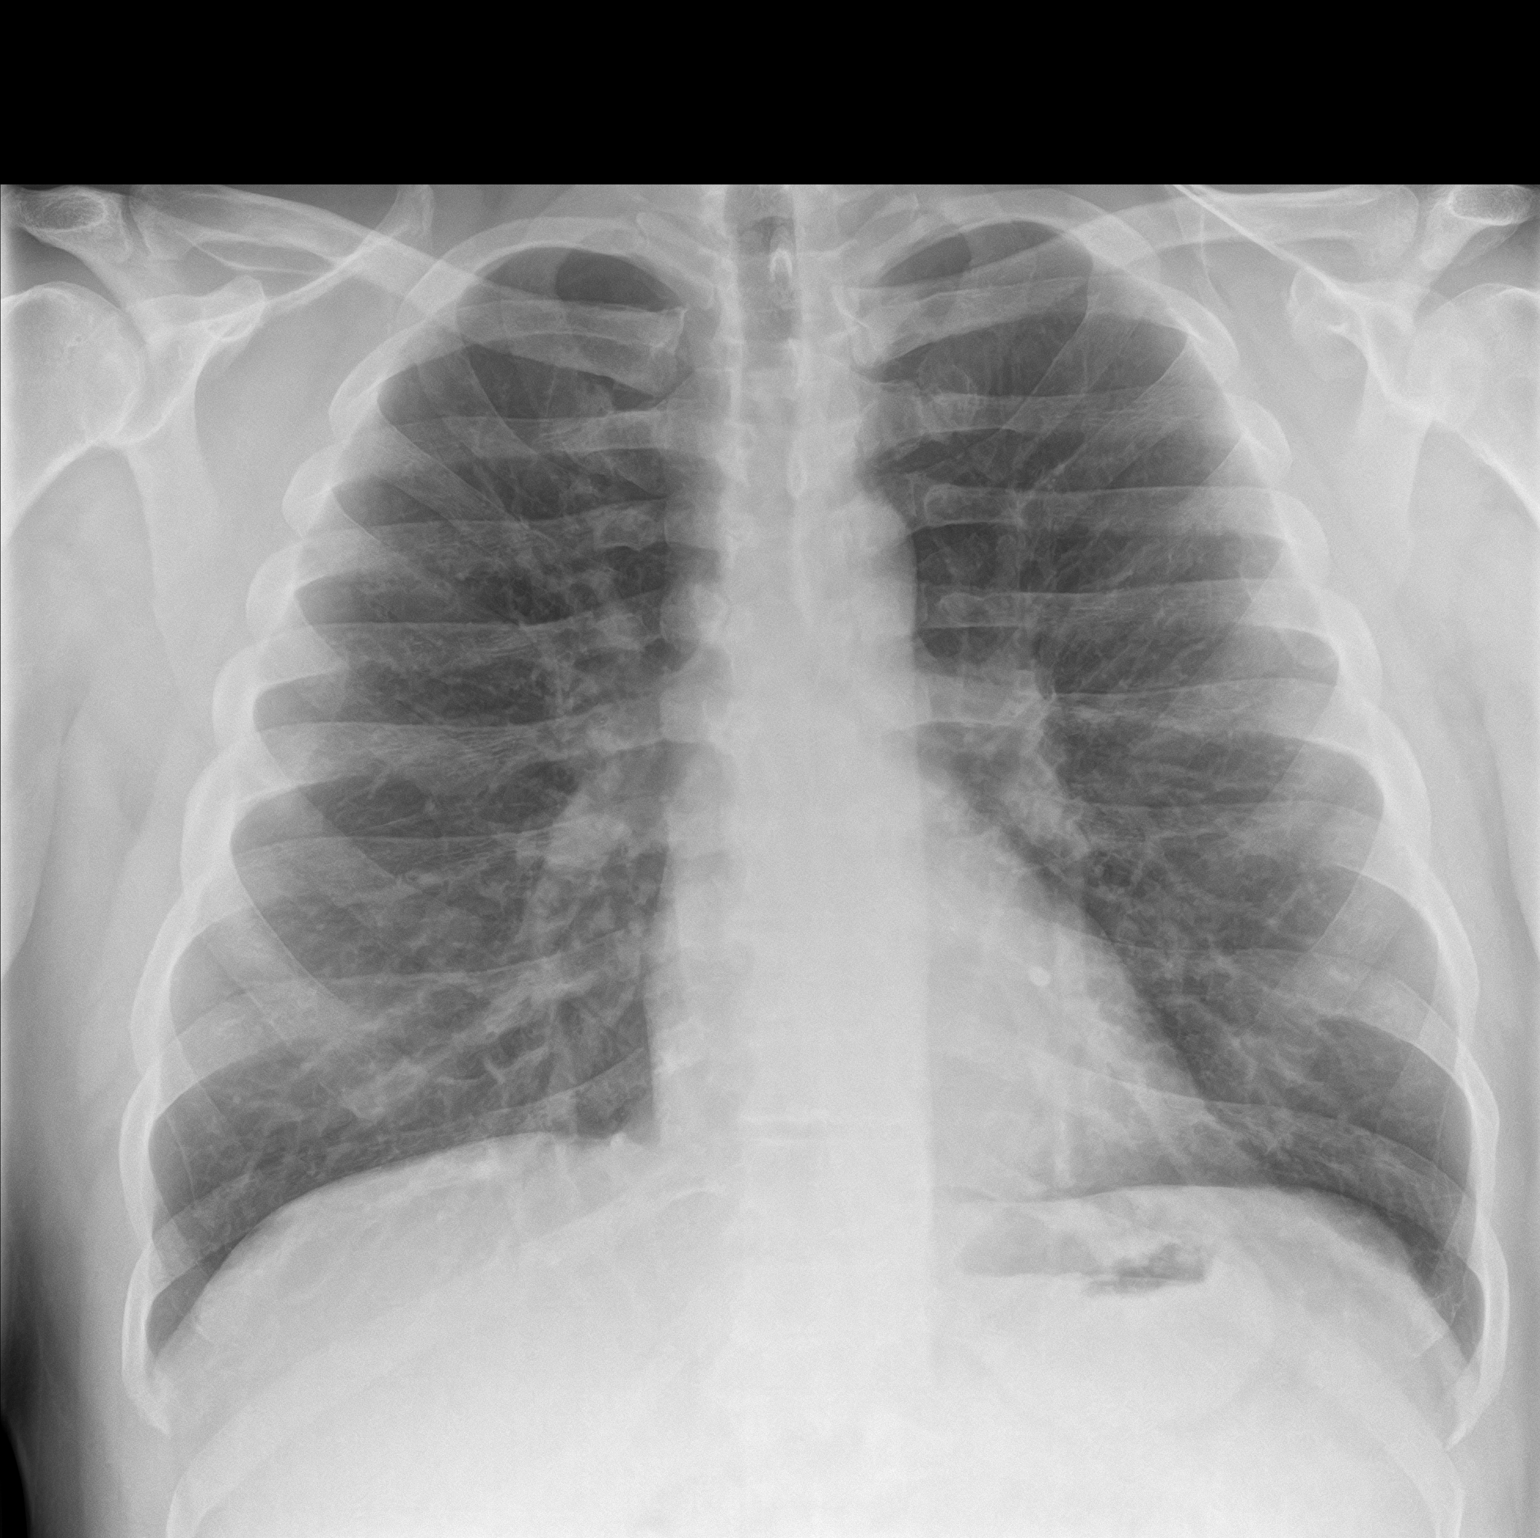
[im 2/2]
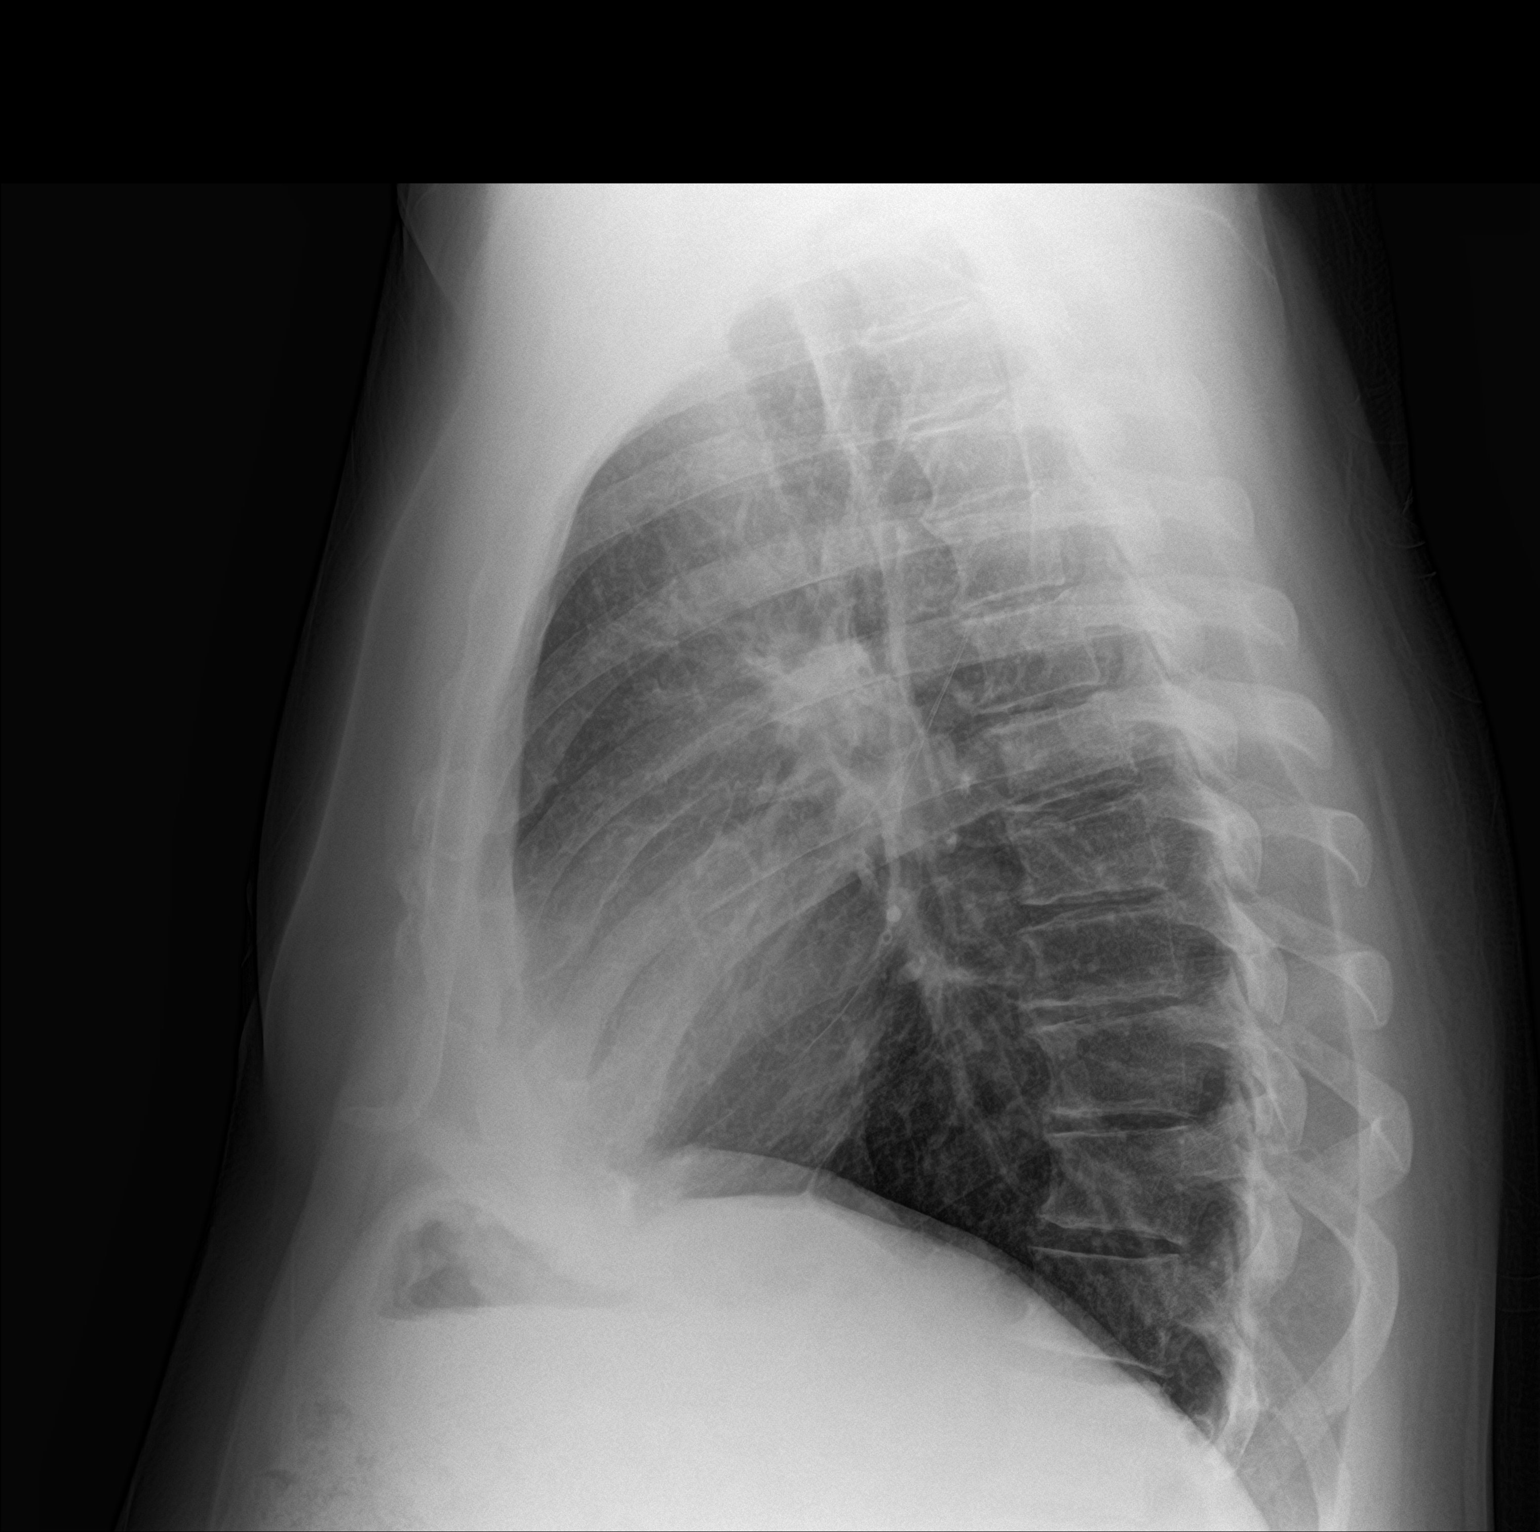

[2 of 2 positions shown; findings below may reference images not displayed]

FINDINGS: Mild peribronchial thickening. Heart and mediastinal contours are
within normal limits. No focal opacities or effusions. No acute bony
abnormality.
IMPRESSION: Mild bronchitic changes.

## 2019-01-19 ENCOUNTER — Encounter: Payer: Self-pay | Admitting: *Deleted

## 2019-01-19 ENCOUNTER — Emergency Department: Payer: No Typology Code available for payment source

## 2019-01-19 ENCOUNTER — Other Ambulatory Visit: Payer: Self-pay

## 2019-01-19 ENCOUNTER — Emergency Department
Admission: EM | Admit: 2019-01-19 | Discharge: 2019-01-19 | Disposition: A | Discharge status: Home / Self Care | Payer: No Typology Code available for payment source | Attending: Emergency Medicine | Admitting: Emergency Medicine

## 2019-01-19 DIAGNOSIS — Z79899 Other long term (current) drug therapy: Secondary | ICD-10-CM | POA: Insufficient documentation

## 2019-01-19 DIAGNOSIS — R079 Chest pain, unspecified: Secondary | ICD-10-CM | POA: Insufficient documentation

## 2019-01-19 DIAGNOSIS — F17210 Nicotine dependence, cigarettes, uncomplicated: Secondary | ICD-10-CM | POA: Insufficient documentation

## 2019-01-19 DIAGNOSIS — E11319 Type 2 diabetes mellitus with unspecified diabetic retinopathy without macular edema: Secondary | ICD-10-CM | POA: Insufficient documentation

## 2019-01-19 DIAGNOSIS — Z7984 Long term (current) use of oral hypoglycemic drugs: Secondary | ICD-10-CM | POA: Insufficient documentation

## 2019-01-19 DIAGNOSIS — J449 Chronic obstructive pulmonary disease, unspecified: Secondary | ICD-10-CM | POA: Diagnosis not present

## 2019-01-19 LAB — CBC
HCT: 30.2 % — ABNORMAL LOW (ref 39.0–52.0)
HCT: 44.3 % (ref 39.0–52.0)
Hemoglobin: 9.6 g/dL — ABNORMAL LOW (ref 13.0–17.0)
Hemoglobin: 14 g/dL (ref 13.0–17.0)
MCH: 28.6 pg (ref 26.0–34.0)
MCH: 28.6 pg (ref 26.0–34.0)
MCHC: 31.8 g/dL (ref 30.0–36.0)
MCHC: 31.6 g/dL (ref 30.0–36.0)
MCV: 89.9 fL (ref 80.0–100.0)
MCV: 90.6 fL (ref 80.0–100.0)
NRBC: 0 % (ref 0.0–0.2)
Platelets: 145 10*3/uL — ABNORMAL LOW (ref 150–400)
Platelets: 204 10*3/uL (ref 150–400)
RBC: 3.36 MIL/uL — AB (ref 4.22–5.81)
RBC: 4.89 MIL/uL (ref 4.22–5.81)
RDW: 14.5 % (ref 11.5–15.5)
RDW: 14.3 % (ref 11.5–15.5)
WBC: 8.2 10*3/uL (ref 4.0–10.5)
WBC: 12.5 10*3/uL — ABNORMAL HIGH (ref 4.0–10.5)
nRBC: 0 % (ref 0.0–0.2)

## 2019-01-19 LAB — BASIC METABOLIC PANEL
Anion gap: 5 (ref 5–15)
BUN: 13 mg/dL (ref 6–20)
CHLORIDE: 108 mmol/L (ref 98–111)
CO2: 26 mmol/L (ref 22–32)
Calcium: 7.4 mg/dL — ABNORMAL LOW (ref 8.9–10.3)
Creatinine, Ser: 0.68 mg/dL (ref 0.61–1.24)
GFR calc Af Amer: >60 mL/min (ref >60)
GFR calc non Af Amer: >60 mL/min (ref >60)
Glucose, Bld: 219 mg/dL — ABNORMAL HIGH (ref 70–99)
Potassium: 3.4 mmol/L — ABNORMAL LOW (ref 3.5–5.1)
Sodium: 139 mmol/L (ref 135–145)

## 2019-01-19 LAB — TROPONIN I: Troponin I: <0.03 ng/mL (ref <0.03)

## 2019-01-19 MED ORDER — MORPHINE SULFATE (PF) 4 MG/ML IV SOLN
4.0000 mg | Freq: Once | INTRAVENOUS | Status: AC
  Administered 2019-01-19: 4 mg via INTRAVENOUS
  Filled 2019-01-19: qty 1

## 2019-01-19 MED ORDER — SODIUM CHLORIDE 0.9% FLUSH: 3.0000 mL | Freq: Once | INTRAVENOUS | Status: DC

## 2019-01-19 MED ORDER — FENTANYL CITRATE (PF) 100 MCG/2ML IJ SOLN
50.0000 ug | Freq: Once | INTRAMUSCULAR | Status: AC
  Administered 2019-01-19: 50 ug via INTRAVENOUS
  Filled 2019-01-19: qty 2

## 2019-01-19 MED ORDER — VALACYCLOVIR HCL 1 G PO TABS: 1000.0000 mg | ORAL_TABLET | Freq: Three times a day (TID) | ORAL | 0 refills | Status: AC

## 2019-01-19 MED ORDER — IOHEXOL 350 MG/ML SOLN
75.0000 mL | Freq: Once | INTRAVENOUS | Status: AC | PRN
  Administered 2019-01-19: 75 mL via INTRAVENOUS

## 2019-01-19 MED ORDER — OXYCODONE-ACETAMINOPHEN 5-325 MG PO TABS: 1.0000 | ORAL_TABLET | ORAL | 0 refills | Status: DC | PRN

## 2019-01-19 NOTE — ED Notes (Signed)
Peripheral IV discontinued. Catheter intact. No signs of infiltration or redness. Gauze applied to IV site.    Discharge instructions reviewed with patient. Questions fielded by this RN. Patient verbalizes understanding of instructions. Patient discharged home in stable condition per [facility]. No acute distress noted at time of discharge.    

## 2019-01-19 NOTE — Discharge Instructions (Addendum)
As we discussed I do think it is possible that you have shingles without the rash. Please follow up with your primary care physician. Please seek medical attention for any high fevers, chest pain, shortness of breath, change in behavior, persistent vomiting, bloody stool or any other new or concerning symptoms.

## 2019-01-19 NOTE — ED Notes (Signed)
Patient transported to CT 

## 2019-01-19 NOTE — ED Provider Notes (Signed)
[facility] Emergency Department Provider Note   ____________________________________________   I have reviewed the triage vital signs and the nursing notes.   HISTORY  Chief Complaint Chest Pain   History limited by: Not Limited   HPI Joel Page is a 48 y.o. male who presents to the emergency department today because of concerns for continued right-sided chest pain.  Patient has had this chest pain for almost 1 month.  Has been evaluated [facility] emergency department 3 times since the beginning 2 times in the past week.  The pain is located on the right lateral chest.  States it is severe.  (Worse.  It has been associated with some cough.  Tonight when he was taking a deep breath and felt a pop.  He did feel like he had some relief for about 20 minutes and today he started having pain again.  Patient has been trying the lidocaine patches prescribed without any significant relief.    Per medical record review patient has a history of recent ER visits to [facility], diagnosed with pleurisy. Negative CXRs and d-dimer.   Past Medical History:  Diagnosis Date  . Allergy   . DDD (degenerative disc disease)   . Diabetes mellitus   . Insomnia   . Lumbar stenosis   . 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 Hyperlipidemia LDL goal <100 07/04/2016  . Obesity 06/29/2016  . Back pain at L4-L5 level 04/07/2016  . Cough 03/30/2016  . Medication monitoring encounter 03/30/2016  . COPD suggested by initial evaluation ([place]) 12/22/2015  . 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  . Diabetes mellitus type 2, uncontrolled ([place]) 05/11/2015  . Current smoker 05/11/2015  . Hemorrhoids, internal 05/11/2015  . Arthralgia of hip 05/11/2015  . Adiposity 05/11/2015  . 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  . Restless leg 05/11/2015  . Trochanteric bursitis  of right hip 06/17/2013  . Sciatica 03/19/2012  . Lumbar degenerative disc disease 03/19/2012    Past Surgical History:  Procedure Laterality Date  . EXCISIONAL HEMORRHOIDECTOMY  2012  . ORIF FEMUR FRACTURE  1989  . SPHINCTEROTOMY    . TOOTH EXTRACTION      Prior to Admission medications   Medication Sig Start Date End Date Taking? Authorizing Provider  albuterol (PROVENTIL HFA;VENTOLIN HFA) 108 (90 Base) MCG/ACT inhaler Inhale 2-4 puffs by mouth every 4 hours as needed for wheezing, cough, and/or shortness of breath 12/05/18   Hubbard Hartshorn, FNP  atorvastatin (LIPITOR) 20 MG tablet Take 1 tablet (20 mg total) by mouth at bedtime. 12/05/18   Hubbard Hartshorn, FNP  azithromycin (ZITHROMAX) 250 MG tablet Day1: Take 2 tabs; Days 2-5: Take 1 tab daily 12/05/18   Hubbard Hartshorn, FNP  Blood Glucose Monitoring Suppl (BAYER CONTOUR NEXT MONITOR) w/Device KIT 1 Device by Does not apply route 1 day or 1 dose. DX: DM E11.5 12/22/15   Bobetta Lime, MD  Cinnamon 500 MG capsule Take 1,000 mg by mouth daily.    [provider]  cyclobenzaprine (FLEXERIL) 10 MG tablet Take 1 tablet (10 mg total) by mouth 3 (three) times daily as needed. Patient not taking: Reported on 12/05/2018 08/21/18   Sable Feil, PA-C  Echinacea 400 MG CAPS Take 2 capsules by mouth.    [provider]  metFORMIN (GLUCOPHAGE-XR) 500 MG 24 hr tablet One by mouth daily; appointment  needed 12/05/18   Hubbard Hartshorn, FNP  Omega-3 Fatty Acids (FISH OIL) 1000 MG CAPS Take by mouth.    [provider]    Allergies Topiramate; Trazodone; Baclofen; and Diclofenac sodium  Family History  Problem Relation Age of Onset  . Lung cancer Mother   . Diabetes Mother   . Dementia Father   . COPD Father   . Diabetes Brother     Social History Social History   Tobacco Use  . Smoking status: Current Some Day Smoker    Packs/day: 1.50    Types: Cigarettes  . Smokeless tobacco: Current User  Substance Use Topics   . Alcohol use: Yes    Alcohol/week: 0.0 standard drinks  . Drug use: Not on file    Review of Systems Constitutional: No fever/chills Eyes: No visual changes. ENT: No sore throat. Cardiovascular: Positive for right sided chest pain. Respiratory: Positive for cough. Gastrointestinal: No abdominal pain.  No nausea, no vomiting.  No diarrhea.   Genitourinary: Negative for dysuria. Musculoskeletal: Negative for back pain. Skin: Negative for rash. Neurological: Negative for headaches, focal weakness or numbness.  ____________________________________________   PHYSICAL EXAM:  VITAL SIGNS: ED Triage Vitals  Enc Vitals Group     BP 01/19/19 0300 (!) 137/59     Pulse Rate 01/19/19 0300 84     Resp 01/19/19 0300 14     Temp 01/19/19 0300 98.5 F (36.9 C)     Temp Source 01/19/19 0300 Oral     SpO2 01/19/19 0300 95 %     Weight 01/19/19 0255 228 lb (103.4 kg)     Height 01/19/19 0255 _0  (1.753 m)     Head Circumference --      Peak Flow --      Pain Score 01/19/19 0255 8   Constitutional: Alert and oriented.  Eyes: Conjunctivae are normal.  ENT      Head: Normocephalic and atraumatic.      Nose: No congestion/rhinnorhea.      Mouth/Throat: Mucous membranes are moist.      Neck: No stridor. Hematological/Lymphatic/Immunilogical: No cervical lymphadenopathy. Cardiovascular: Normal rate, regular rhythm.  No murmurs, rubs, or gallops.  Respiratory: Some splinting on the right side. Decreased deep breath ability. Clear lung sounds.  Gastrointestinal: Soft and non tender. No rebound. No guarding.  Genitourinary: Deferred Musculoskeletal: Normal range of motion in all extremities.  Neurologic:  Normal speech and language. No gross focal neurologic deficits are appreciated.  Skin:  Skin is warm, dry and intact. No rash noted. Psychiatric: Mood and affect are normal. Speech and behavior are normal. Patient exhibits appropriate insight and  judgment.  ____________________________________________    LABS (pertinent positives/negatives)  WBC 12.5, hgb 14.0, plt 204 Trop <0.03 BMP na 139, k 3.4, gl 108, glu 219, cr 0.68  ____________________________________________   EKG  I, Nance Pear, attending physician, personally viewed and interpreted this EKG  EKG Time: 0258 Rate: 92 Rhythm: sinus rhythm Axis: normal Intervals: qtc 468 QRS: narrow ST changes: no st elevation Impression: normal ekg   ____________________________________________    RADIOLOGY  CXR No evidence of acute disease  ____________________________________________   PROCEDURES  Procedures  ____________________________________________   INITIAL IMPRESSION / ASSESSMENT AND PLAN / ED COURSE  Pertinent labs & imaging results that were available during my care of the patient were reviewed by me and considered in my medical decision making (see chart for details).   Patient presented to the emergency department today because of concern for  right sided chest pain. Patient had been worked up at [facility] for this with negative x-rays and blood work. The patient continues to have significant pain. CXR was repeated here without any concerning findings. Patient did have negative d-dimer at [facility] but did discuss CT angio with patient. This was performed without any obvious etiology of pain. Some hilar lymph enlargement. Discussed this with the patient. Given that patient has not had significant relief on meds already prescribed will trial patient on valtrex. Discussed plan with patient.   ____________________________________________   FINAL CLINICAL IMPRESSION(S) / ED DIAGNOSES  Final diagnoses:  Right-sided chest pain     Note: This dictation was prepared with Dragon dictation. Any transcriptional errors that result from this process are unintentional     Nance Pear, MD 01/19/19 [phone]

## 2019-01-19 NOTE — ED Notes (Signed)
ED Provider at bedside.  Pt c/o coughing and "felt something pop and then I could breathe better for about 15 minutes then it got bad again"  Pt reports pain in right chest pain, pt seen at [facility] for the last three visits for pleurisy dx, pain worse with deep inhalation and coughing   Pt skin appears without rash

## 2019-01-19 NOTE — ED Triage Notes (Signed)
Pt arrives via [facility] with c/o chest pain. Pt has been having CP since Feb, has been evaluated for the same prior (x 3) dx with pleurisy and bronchitis, given abx, valium, lidocaine patches, and steroids. Right sided cp, worse to movement, breathing and coughing. En route, vitals 142/82, hr 98, 96%RA, cbg 273. IV established in the left AC, 200NS administered.

## 2019-01-20 ENCOUNTER — Ambulatory Visit (INDEPENDENT_AMBULATORY_CARE_PROVIDER_SITE_OTHER): Payer: PRIVATE HEALTH INSURANCE | Admitting: Nurse Practitioner

## 2019-01-20 ENCOUNTER — Telehealth: Payer: Self-pay | Admitting: Family Medicine

## 2019-01-20 ENCOUNTER — Encounter: Payer: Self-pay | Admitting: Nurse Practitioner

## 2019-01-20 VITALS — BP 112/70 | HR 81 | Temp 98.6°F | Resp 18 | Ht 69.0 in | Wt 243.2 lb

## 2019-01-20 DIAGNOSIS — E876 Hypokalemia: Secondary | ICD-10-CM

## 2019-01-20 DIAGNOSIS — R079 Chest pain, unspecified: Secondary | ICD-10-CM | POA: Diagnosis not present

## 2019-01-20 DIAGNOSIS — K219 Gastro-esophageal reflux disease without esophagitis: Secondary | ICD-10-CM | POA: Diagnosis not present

## 2019-01-20 DIAGNOSIS — J42 Unspecified chronic bronchitis: Secondary | ICD-10-CM

## 2019-01-20 DIAGNOSIS — E1165 Type 2 diabetes mellitus with hyperglycemia: Secondary | ICD-10-CM

## 2019-01-20 DIAGNOSIS — R591 Generalized enlarged lymph nodes: Secondary | ICD-10-CM

## 2019-01-20 DIAGNOSIS — J984 Other disorders of lung: Secondary | ICD-10-CM

## 2019-01-20 MED ORDER — UMECLIDINIUM-VILANTEROL 62.5-25 MCG/INH IN AEPB: 1.0000 | INHALATION_SPRAY | Freq: Every day | RESPIRATORY_TRACT | 2 refills | Status: DC

## 2019-01-20 MED ORDER — INDOMETHACIN 25 MG PO CAPS: 25.0000 mg | ORAL_CAPSULE | Freq: Three times a day (TID) | ORAL | 0 refills | Status: DC

## 2019-01-20 MED ORDER — CYCLOBENZAPRINE HCL 7.5 MG PO TABS: 7.5000 mg | ORAL_TABLET | Freq: Three times a day (TID) | ORAL | 0 refills | Status: DC | PRN

## 2019-01-20 NOTE — Patient Instructions (Addendum)
-   Please complete the course of medications prescribed - Please take Anoro once daily to help with breathing, use albuterol inhaler and just as needed for chest tightness, wheezing. - Please take muscle relaxer up to every 8 hours as needed for pain- do not take with alcohol or drive on this medication. Take indomethacin with food on your stomach 2 hours after taking tums.  - Increase calcium in diet, can additionally take at least 2 tums a day (calclium carbonate) to increase levels.  - Hold metformin until instructed otherwise by [place].   Please do see your eye doctor regularly, and have your eyes examined every year (or more often per his or her recommendation) Check your feet every night and let me know right away of any sores, infections, numbness, etc. Try to limit sweets, white bread, white rice, white potatoes It is okay with me for you to not check your fingerstick blood sugars (per [employer] of Endocrinology Best Practices), unless you are interested and feel it would be helpful for you

## 2019-01-20 NOTE — Telephone Encounter (Signed)
Copied from [place] [id number]. Topic: Quick Communication - See Telephone Encounter >> Jan 20, 2019  3:41 PM Blase Mess A wrote: CRM for notification. See Telephone encounter for: 01/20/19.  Patient is calling to state that he is unable to afford his medication. Requesting his medication cyclobenzaprine (FEXMID) 7.5 MG tablet [[id number]]  to be changed 10mg  the price goes down to $12. Patient is a request for a cheaper medication for xarelto. Please advise

## 2019-01-20 NOTE — Progress Notes (Signed)
Name: Joel Page   MRN: [id number]    DOB: 1971-06-20   Date:01/20/2019       Progress Note  Subjective  Chief Complaint  Chief Complaint  Patient presents with  . ER f/u    swollen lyph nodes. patient stated on Sat night he coughed and felt something pop.    HPI  Patient was seen in ER yesterday for right sided chest pain that has been ongoing for one month- had been to the ER 4 times for pain. Was not having relief with lidocaine patch and yesterday felt something pop with coughing. Chest xray revealed chronic bronchitis. CT angio revealed mildly enlarged right hilar lymph nodes and Left upper lobe small pneumatocele or subpleural cysts.  He had a mild increase in WBC with no elevation in neutrophils, mild decrease in potassium 3.4 and hypocalcemia of 7.4. Patient was rx short term percocet for pain and valacyclovir TID. States initial ER visit given steroid but it shot his sugar up so he stopped taking it yesterday because he had to stop his metformin due to IV contrast. States his right sided pain is still very bad, percocet provides minimal relief. States feels he is having muscle spasms. Pain is worse with deep breath and coughing. Endorses mild shortness of breath. Decreased smoking from a pack a day but due to pain has a pack lasting him 3 days. States he is currently taking zpack.   Diabetes: had restarted his metformin after last visit but stopped due to contrast.   Patient Active Problem List   Diagnosis Date Noted  . Hyperlipidemia LDL goal <100 07/04/2016  . Obesity 06/29/2016  . Back pain at L4-L5 level 04/07/2016  . Cough 03/30/2016  . Medication monitoring encounter 03/30/2016  . COPD suggested by initial evaluation ([place]) 12/22/2015  . 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  . Diabetes mellitus type 2, uncontrolled ([place]) 05/11/2015  . Current smoker 05/11/2015  . Hemorrhoids, internal 05/11/2015  . Arthralgia of hip 05/11/2015  . Adiposity  05/11/2015  . 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  . Restless leg 05/11/2015  . Trochanteric bursitis of right hip 06/17/2013  . Sciatica 03/19/2012  . Lumbar degenerative disc disease 03/19/2012    Past Medical History:  Diagnosis Date  . Allergy   . DDD (degenerative disc disease)   . Diabetes mellitus   . Insomnia   . Lumbar stenosis   . 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 EXCISIONAL HEMORRHOIDECTOMY  2012  . ORIF FEMUR FRACTURE  1989  . SPHINCTEROTOMY    . TOOTH EXTRACTION      Social History   Tobacco Use  . Smoking status: Current Some Day Smoker    Packs/day: 1.50    Types: Cigarettes  . Smokeless tobacco: Current User  Substance Use Topics  . Alcohol use: Yes    Alcohol/week: 0.0 standard drinks     Current Outpatient Medications:  .  lidocaine (LIDODERM) 5 %, Place onto the skin., Disp: , Rfl:  .  predniSONE (DELTASONE) 20 MG tablet, 3 po daily for 2 days, then 2 po daily for 2 days, then 1 po daily for 2 days, Disp: , Rfl:  .  albuterol (PROVENTIL HFA;VENTOLIN HFA) 108 (90 Base) MCG/ACT inhaler, Inhale 2-4 puffs by mouth every 4 hours as needed for wheezing, cough, and/or shortness of breath, Disp: 1 Inhaler, Rfl: 1 .  atorvastatin (  LIPITOR) 20 MG tablet, Take 1 tablet (20 mg total) by mouth at bedtime., Disp: 90 tablet, Rfl: 1 .  azithromycin (ZITHROMAX) 250 MG tablet, Day1: Take 2 tabs; Days 2-5: Take 1 tab daily, Disp: 6 tablet, Rfl: 0 .  Blood Glucose Monitoring Suppl (BAYER CONTOUR NEXT MONITOR) w/Device KIT, 1 Device by Does not apply route 1 day or 1 dose. DX: DM E11.5, Disp: 1 kit, Rfl: 0 .  Cinnamon 500 MG capsule, Take 1,000 mg by mouth daily., Disp: , Rfl:  .  cyclobenzaprine (FLEXERIL) 10 MG tablet, Take 1 tablet (10 mg total) by mouth 3 (three) times daily as needed. (Patient not taking: Reported on 12/05/2018), Disp: 15 tablet, Rfl: 0 .   diazepam (VALIUM) 5 MG tablet, , Disp: , Rfl:  .  Echinacea 400 MG CAPS, Take 2 capsules by mouth., Disp: , Rfl:  .  metFORMIN (GLUCOPHAGE-XR) 500 MG 24 hr tablet, One by mouth daily; appointment needed, Disp: 180 tablet, Rfl: 0 .  Omega-3 Fatty Acids (FISH OIL) 1000 MG CAPS, Take by mouth., Disp: , Rfl:  .  oxyCODONE-acetaminophen (PERCOCET) 5-325 MG tablet, Take 1 tablet by mouth every 4 (four) hours as needed for severe pain., Disp: 15 tablet, Rfl: 0 .  valACYclovir (VALTREX) 1000 MG tablet, Take 1 tablet (1,000 mg total) by mouth 3 (three) times daily for 7 days., Disp: 21 tablet, Rfl: 0  Allergies  Allergen Reactions  . Topiramate Other (See Comments)    White light in peripheral vision  . Trazodone Other (See Comments)    Makes tingling & numbness in hands & arms worse; causes an erection.  . Baclofen Itching  . Diclofenac Sodium Other (See Comments)    Raised blood sugar     ROS   No other specific complaints in a complete review of systems (except as listed in HPI above).  Objective  Vitals:   01/20/19 1124  BP: 112/70  Pulse: 81  Resp: 18  Temp: 98.6 F (37 C)  TempSrc: Oral  SpO2: 94%  Weight: 243 lb 3.2 oz (110.3 kg)  Height: 5' 9"  (1.753 m)    Body mass index is 35.91 kg/m.  Nursing Note and Vital Signs reviewed.  Physical Exam Constitutional:      Appearance: Normal appearance.     Comments: Patient wearing sunglasses, sitting in wheelchair   HENT:     Head: Normocephalic and atraumatic.  Neck:     Thyroid: No thyromegaly.     Vascular: No carotid bruit.     Trachea: Trachea normal.  Cardiovascular:     Rate and Rhythm: Normal rate and regular rhythm.  Pulmonary:     Effort: Pulmonary effort is normal.     Breath sounds: Wheezing (mild bilateral expiratory wheezing heard throughout) present.  Chest:    Lymphadenopathy:     Cervical: No cervical adenopathy.     Upper Body:     Right upper body: No supraclavicular adenopathy.     Left upper  body: No supraclavicular adenopathy.  Skin:    General: Skin is warm and dry.     Findings: No erythema or rash.  Neurological:     General: No focal deficit present.     Mental Status: He is alert and oriented to person, place, and time.     Gait: Gait normal.     Comments: Able to stand self up from wheelchair and walk to bathroom with steady gait   Psychiatric:        Mood  and Affect: Mood normal.        Behavior: Behavior normal.        Thought Content: Thought content normal.        Results for orders placed or performed during the hospital encounter of 01/19/19 (from the past 48 hour(s))  Basic metabolic panel     Status: Abnormal   Collection Time: 01/19/19  3:01 AM  Result Value Ref Range   Sodium 139 135 - 145 mmol/L   Potassium 3.4 (L) 3.5 - 5.1 mmol/L   Chloride 108 98 - 111 mmol/L   CO2 26 22 - 32 mmol/L   Glucose, Bld 219 (H) 70 - 99 mg/dL   BUN 13 6 - 20 mg/dL   Creatinine, Ser 0.68 0.61 - 1.24 mg/dL   Calcium 7.4 (L) 8.9 - 10.3 mg/dL   GFR calc non Af Amer >60 >60 mL/min   GFR calc Af Amer >60 >60 mL/min   Anion gap 5 5 - 15    Comment: Performed at [facility], [address] [place], [place] [address]  CBC     Status: Abnormal   Collection Time: 01/19/19  3:01 AM  Result Value Ref Range   WBC 8.2 4.0 - 10.5 K/uL   RBC 3.36 (L) 4.22 - 5.81 MIL/uL   Hemoglobin 9.6 (L) 13.0 - 17.0 g/dL   HCT 30.2 (L) 39.0 - 52.0 %   MCV 89.9 80.0 - 100.0 fL   MCH 28.6 26.0 - 34.0 pg   MCHC 31.8 30.0 - 36.0 g/dL   RDW 14.5 11.5 - 15.5 %   Platelets 145 (L) 150 - 400 K/uL   nRBC 0.0 0.0 - 0.2 %    Comment: Performed at [facility], [address] [place], [place] [address]  Troponin I - ONCE - STAT     Status: None   Collection Time: 01/19/19  3:01 AM  Result Value Ref Range   Troponin I <0.03 <0.03 ng/mL    Comment: Performed at [facility], [address] [place], [place] [address]  CBC     Status: Abnormal   Collection Time:  01/19/19  3:47 AM  Result Value Ref Range   WBC 12.5 (H) 4.0 - 10.5 K/uL   RBC 4.89 4.22 - 5.81 MIL/uL   Hemoglobin 14.0 13.0 - 17.0 g/dL   HCT 44.3 39.0 - 52.0 %   MCV 90.6 80.0 - 100.0 fL   MCH 28.6 26.0 - 34.0 pg   MCHC 31.6 30.0 - 36.0 g/dL   RDW 14.3 11.5 - 15.5 %   Platelets 204 150 - 400 K/uL   nRBC 0.0 0.0 - 0.2 %    Comment: Performed at [facility], [address] [place], [place] [address]    Assessment & Plan  1. Right-sided chest pain - Please complete the course of medications prescribed - Please take Anoro once daily to help with breathing, use albuterol inhaler and just as needed for chest tightness, wheezing. - Please take muscle relaxer up to every 8 hours as needed for pain- do not take with alcohol or drive on this medication. Take indomethacin with food on your stomach 2 hours after taking tums.  - Increase calcium in diet, can additionally take at least 2 tums a day (calclium carbonate) to increase levels.  - Hold metformin until instructed otherwise by [place].  - indomethacin (INDOCIN) 25 MG capsule; Take 1 capsule (25 mg total) by mouth 3 (three) times daily with meals.  Dispense: 30 capsule; Refill: 0 - cyclobenzaprine (FEXMID) 7.5 MG tablet; Take 1 tablet (7.5 mg total) by mouth 3 (three) times daily as needed.  Dispense: 30 tablet; Refill: 0    2. Hypocalcemia - PTH, Intact and Calcium - COMPLETE METABOLIC PANEL WITH GFR - Vitamin D (25 hydroxy) - Magnesium  3. Hypokalemia - COMPLETE METABOLIC PANEL WITH GFR  4. Gastroesophageal reflux disease without esophagitis Tums, diet,   5. Chronic bronchitis, unspecified chronic bronchitis type ([place]) - umeclidinium-vilanterol (ANORO ELLIPTA) 62.5-25 MCG/INH AEPB; Inhale 1 puff into the lungs daily.  Dispense: 30 each; Refill: 2  6. Pneumatocele of lung Discussed with PCP Dr. Sanda Klein who recommended referral to Nestor Lewandowsky MD.  - Ambulatory referral to General Surgery  7. Lymphadenopathy - complete  antibiotic course.  - Ambulatory referral to General Surgery  8. Uncontrolled type 2 diabetes mellitus with hyperglycemia (HCC) Hold metformin until advised- at least 48 hours due to IV contrast will check kidney function today- discussed importance of diet. Try to limit sweets, white bread, white rice, white potatoes   Follow up in one week.   -Red flags and when to present for emergency care or RTC including fever >101.88F, chest pain, shortness of breath, new/worsening/un-resolving symptoms, reviewed with patient at time of visit. Follow up and care instructions discussed and provided in AVS.

## 2019-01-21 ENCOUNTER — Other Ambulatory Visit: Payer: Self-pay | Admitting: Nurse Practitioner

## 2019-01-21 LAB — VITAMIN D 25 HYDROXY (VIT D DEFICIENCY, FRACTURES): Vit D, 25-Hydroxy: 19 ng/mL — ABNORMAL LOW (ref 30–100)

## 2019-01-21 LAB — COMPLETE METABOLIC PANEL WITH GFR
AG Ratio: 1.7 (calc) (ref 1.0–2.5)
ALT: 12 U/L (ref 9–46)
AST: 12 U/L (ref 10–40)
Albumin: 4 g/dL (ref 3.6–5.1)
Alkaline phosphatase (APISO): 76 U/L (ref 36–130)
BUN: 9 mg/dL (ref 7–25)
CO2: 28 mmol/L (ref 20–32)
Calcium: 8.9 mg/dL (ref 8.6–10.3)
Chloride: 104 mmol/L (ref 98–110)
Creat: 0.87 mg/dL (ref 0.60–1.35)
GFR, Est African American: 117 mL/min/{1.73_m2} (ref >=60)
GFR, Est Non African American: 101 mL/min/{1.73_m2} (ref >=60)
Globulin: 2.3 g/dL (calc) (ref 1.9–3.7)
Glucose, Bld: 176 mg/dL — ABNORMAL HIGH (ref 65–99)
Potassium: 4.4 mmol/L (ref 3.5–5.3)
Sodium: 139 mmol/L (ref 135–146)
Total Bilirubin: 0.3 mg/dL (ref 0.2–1.2)
Total Protein: 6.3 g/dL (ref 6.1–8.1)

## 2019-01-21 LAB — PTH, INTACT AND CALCIUM
Calcium: 8.9 mg/dL (ref 8.6–10.3)
PTH: 34 pg/mL (ref 14–64)

## 2019-01-21 LAB — MAGNESIUM: Magnesium: 2.2 mg/dL (ref 1.5–2.5)

## 2019-01-21 MED ORDER — CYCLOBENZAPRINE HCL 10 MG PO TABS: 10.0000 mg | ORAL_TABLET | Freq: Three times a day (TID) | ORAL | 0 refills | Status: DC | PRN

## 2019-01-22 NOTE — Telephone Encounter (Signed)
Sent to pharmacy yesterday

## 2019-01-22 NOTE — Telephone Encounter (Signed)
Patient states he had to go ahead and pay for the expensive muscle relaxer due to his pain level. Went over his labs with him as well and he verbalized understanding to take additional 1,000 Vitamin D otc. Also patient states his cough is much better but has red streaks in his tissue paper after coughing and wanted to let Mrs. Poulose know.

## 2019-01-22 NOTE — Telephone Encounter (Signed)
We will re-evaluate at his next visit likely due to bronchitis, if coughing up increased blood please get urgent medical attention.

## 2019-01-22 NOTE — Telephone Encounter (Signed)
Patient notified and states he is feeling a lot better but he will seek urgent medical attention if the volume of blood increases.

## 2019-01-28 ENCOUNTER — Other Ambulatory Visit: Payer: Self-pay

## 2019-01-28 DIAGNOSIS — J984 Other disorders of lung: Secondary | ICD-10-CM

## 2019-01-31 ENCOUNTER — Other Ambulatory Visit: Payer: Self-pay

## 2019-01-31 ENCOUNTER — Ambulatory Visit
Admission: RE | Admit: 2019-01-31 | Discharge: 2019-01-31 | Disposition: A | Discharge status: Home / Self Care | Payer: No Typology Code available for payment source | Source: Ambulatory Visit | Attending: Cardiothoracic Surgery | Admitting: Cardiothoracic Surgery

## 2019-01-31 ENCOUNTER — Encounter: Payer: Self-pay | Admitting: Cardiothoracic Surgery

## 2019-01-31 ENCOUNTER — Ambulatory Visit (INDEPENDENT_AMBULATORY_CARE_PROVIDER_SITE_OTHER): Payer: No Typology Code available for payment source | Admitting: Cardiothoracic Surgery

## 2019-01-31 VITALS — BP 118/79 | HR 97 | Temp 97.7°F | Resp 18 | Ht 69.0 in | Wt 238.2 lb

## 2019-01-31 DIAGNOSIS — J984 Other disorders of lung: Secondary | ICD-10-CM

## 2019-01-31 NOTE — Patient Instructions (Addendum)
Return as needed.Patient to call back for results on x-ray.The patient is aware to call back for any questions or concerns.

## 2019-01-31 NOTE — Progress Notes (Signed)
Patient ID: Joel Page, male   DOB: May 12, 1971, 48 y.o.   MRN: [id number]  Chief Complaint  Patient presents with  . New Patient (Initial Visit)    Enlarged hilar lymph node    Referred By to Varney Biles Reason for Referral right chest wall pain  HPI Location, Quality, Duration, Severity, Timing, Context, Modifying Factors, Associated Signs and Symptoms.  Joel Page is a 48 y.o. male.  He is a very healthy 48 year old gentleman who states that his problems began about a month ago when he was working with his Teacher, early years/pre.  He was involved in some training exercises requiring him to fall to the ground.  In doing so he developed some right-sided chest pain and he also developed a cough at about the same time.  This persisted and ultimately became so severe that with any cough he had severe chest pain.  He ultimately had a chest x-ray and a CT scan.  CT scan was performed because he had an acute episode of severe left-sided chest discomfort.  The CT scan did not reveal anything acute but did show a pneumatocele in the left upper lobe.  He comes in today to discuss his symptoms.  He does believe however that he is improving.  He states that over the last week or so's cough and his chest wall pain of both improved.  Of note is that his visit today was complicated by some peripheral exposure to the recent coronavirus.  This prompted everyone to wear a mask and therefore I was unable to examine his oropharynx.   Past Medical History:  Diagnosis Date  . Allergy   . DDD (degenerative disc disease)   . Diabetes mellitus   . Insomnia   . Lumbar stenosis   . 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 EXCISIONAL HEMORRHOIDECTOMY  2012  . ORIF FEMUR FRACTURE  1989  . SPHINCTEROTOMY    . TOOTH EXTRACTION      Family History  Problem Relation Age of Onset  . Lung cancer Mother   . Diabetes  Mother   . Dementia Father   . COPD Father   . Prostate cancer Father   . Diabetes Brother   . Cancer Maternal Grandfather     Social History Social History   Tobacco Use  . Smoking status: Current Some Day Smoker    Packs/day: 1.00    Types: Cigarettes  . Smokeless tobacco: Current User  Substance Use Topics  . Alcohol use: Yes    Alcohol/week: 0.0 standard drinks  . Drug use: Never    Allergies  Allergen Reactions  . Topiramate Other (See Comments)    White light in peripheral vision  . Trazodone Other (See Comments)    Makes tingling & numbness in hands & arms worse; causes an erection.  . Baclofen Itching  . Diclofenac Sodium Other (See Comments)    Raised blood sugar     Current Outpatient Medications  Medication Sig Dispense Refill  . albuterol (PROVENTIL HFA;VENTOLIN HFA) 108 (90 Base) MCG/ACT inhaler Inhale 2-4 puffs by mouth every 4 hours as needed for wheezing, cough, and/or shortness of breath 1 Inhaler 1  . atorvastatin (LIPITOR) 20 MG tablet Take 1 tablet (20 mg total) by mouth at bedtime. 90 tablet 1  . Blood Glucose Monitoring Suppl (BAYER CONTOUR NEXT MONITOR) w/Device KIT 1 Device by Does not apply route 1 day  or 1 dose. DX: DM E11.5 1 kit 0  . Cinnamon 500 MG capsule Take 1,000 mg by mouth daily.    . cyclobenzaprine (FLEXERIL) 10 MG tablet Take 1 tablet (10 mg total) by mouth 3 (three) times daily as needed for muscle spasms. 30 tablet 0  . Echinacea 400 MG CAPS Take 2 capsules by mouth.    . indomethacin (INDOCIN) 25 MG capsule Take 1 capsule (25 mg total) by mouth 3 (three) times daily with meals. 30 capsule 0  . metFORMIN (GLUCOPHAGE-XR) 500 MG 24 hr tablet One by mouth daily; appointment needed 180 tablet 0  . Omega-3 Fatty Acids (FISH OIL) 1000 MG CAPS Take by mouth.     No current facility-administered medications for this visit.       Review of Systems A complete review of systems was asked and was negative except for the following positive  findings history of reflux, fatty liver and heart murmur.  He also describes some hearing loss with frequent ear infections and sinusitis as well as an occasional sore throat.  Blood pressure 118/79, pulse 97, temperature 97.7 F (36.5 C), temperature source Temporal, resp. rate 18, height 5' 9" (1.753 m), weight 238 lb 3.2 oz (108 kg), SpO2 97 %.  Physical Exam CONSTITUTIONAL:  Pleasant, well-developed, well-nourished, and in no acute distress. EYES: Pupils equal and reactive to light, Sclera non-icteric EARS, NOSE, MOUTH AND THROAT:  The oropharynx was clear.  Dentition is good repair.  Oral mucosa pink and moist. LYMPH NODES:  Lymph nodes in the neck and axillae were normal RESPIRATORY:  Lungs were clear.  Normal respiratory effort without pathologic use of accessory muscles of respiration CARDIOVASCULAR: Heart was regular without murmurs.  There were no carotid bruits. GI: The abdomen was soft, nontender, and nondistended. There were no palpable masses. There was no hepatosplenomegaly. There were normal bowel sounds in all quadrants. GU:  Rectal deferred.   MUSCULOSKELETAL:  Normal muscle strength and tone.  No clubbing or cyanosis.   SKIN:  There were no pathologic skin lesions.  There were no nodules on palpation.  There was no pain upon palpation of his right lateral chest wall. NEUROLOGIC:  Sensation is normal.  Cranial nerves are grossly intact. PSYCH:  Oriented to person, place and time.  Mood and affect are normal.  Data Reviewed Chest x-ray and CT scan.  I have personally reviewed the patient's imaging, laboratory findings and medical records.    Assessment    Pneumatocele left upper lobe    Plan    At the present time I do not see any need for continued surveillance of the left upper lobe pneumatocele.  This is overall quite small and is really not contributing to his symptoms.  His main problem is his chest wall pain which is improving and he did not request any further  follow-up at this point.         Nestor Lewandowsky, MD 01/31/2019, 9:52 AM

## 2019-02-09 ENCOUNTER — Telehealth: Payer: Self-pay | Admitting: Family Medicine

## 2019-02-09 DIAGNOSIS — R079 Chest pain, unspecified: Secondary | ICD-10-CM

## 2019-02-09 NOTE — Telephone Encounter (Signed)
Copied from [place] [id number]. Topic: General - Other >> Feb 06, 2019  1:17 PM Oneta Rack wrote: Relation to pt: self  Call back number: [phone] Pharmacy: [id number] [place], [place] [phone] (Phone) [phone] (Fax)    Reason for call: Patient was last seen by Fredderick Severance, NP on  01/20/2019 . Patient states cough has worsen requesting cough medication and patient states medication prescribed for rib cage pain is not working requesting additional Rx. Patient would like a MRI or X ray that can check if he tore a muscle, please advise

## 2019-02-10 MED ORDER — BENZONATATE 200 MG PO CAPS: 200.0000 mg | ORAL_CAPSULE | Freq: Two times a day (BID) | ORAL | 0 refills | Status: DC | PRN

## 2019-02-10 MED ORDER — CYCLOBENZAPRINE HCL 10 MG PO TABS: 10.0000 mg | ORAL_TABLET | Freq: Three times a day (TID) | ORAL | 0 refills | Status: DC | PRN

## 2019-02-10 MED ORDER — INDOMETHACIN 25 MG PO CAPS: 25.0000 mg | ORAL_CAPSULE | Freq: Three times a day (TID) | ORAL | 0 refills | Status: DC

## 2019-02-10 NOTE — Addendum Note (Signed)
Addended by: Fredderick Severance on: 02/10/2019 04:31 PM   Modules accepted: Orders

## 2019-02-10 NOTE — Telephone Encounter (Addendum)
Imaging is not indicated for muscle strain. Resent NSAIDs and muscle relaxer and added on a cough medication. Additionally, non-urgent referrals and imaging are being postponed for 4 weeks due to pandemic. If pain is not improved after second trial of nsaids and muscle relaxer we will refer you to ortho- so let us know. If you develop severe pain or shortness of breath please go to ER.

## 2019-02-11 NOTE — Telephone Encounter (Signed)
Pt.notified

## 2019-02-17 ENCOUNTER — Telehealth: Payer: Self-pay

## 2019-02-17 NOTE — Telephone Encounter (Signed)
Has already been addressed see prior telephone note.

## 2019-02-17 NOTE — Telephone Encounter (Signed)
Copied from [place] [id number]. Topic: General - Other >> Feb 06, 2019  1:17 PM Oneta Rack wrote: Relation to pt: self  Call back number: [phone] Pharmacy: [id number] - [place], [place] [phone] (Phone) [phone] (Fax)    Reason for call: Patient was last seen by Fredderick Severance, NP on  01/20/2019 . Patient states cough has worsen requesting cough medication and patient states medication prescribed for rib cage pain is not working requesting additional Rx. Patient would like a MRI or X ray that can check if he tore a muscle, please advise

## 2019-02-24 ENCOUNTER — Telehealth: Payer: Self-pay | Admitting: *Deleted

## 2019-02-24 ENCOUNTER — Telehealth: Payer: Self-pay

## 2019-02-24 NOTE — Telephone Encounter (Signed)
Copied from [place] [id number] [id number]. Topic: General - Other >> Feb 24, 2019 11:46 AM Carolyn Stare wrote:  EMS came out to pt job cause he was having chest pains, they told him it was not his heart but they did hear fluid and told him it may be a pocket with fluid and to contact his [facility]

## 2019-02-24 NOTE — Telephone Encounter (Signed)
Pt notified he needs to be checked out at ER

## 2019-02-24 NOTE — Telephone Encounter (Signed)
He needs to be evaluated in the ER if he has chest pain EMS coming out to his work does NOT take the place of an ER evaluation with CXR, labs, troponin, EKG, echo, etc. He needs to go to the ER

## 2019-02-24 NOTE — Telephone Encounter (Signed)
Patient called and stated that he had some chest pains at work this morning and his work called the EMT and they evaluated him and everything was good except they heard some fluid in his lungs. Patient stated that the EMT told him to give [place] a call to see what we can do since he is a patient of Dr.Oaks.

## 2019-02-25 ENCOUNTER — Other Ambulatory Visit: Payer: Self-pay

## 2019-02-25 ENCOUNTER — Encounter: Payer: Self-pay | Admitting: Emergency Medicine

## 2019-02-25 ENCOUNTER — Emergency Department
Admission: EM | Admit: 2019-02-25 | Discharge: 2019-02-25 | Disposition: A | Discharge status: Home / Self Care | Payer: No Typology Code available for payment source | Attending: Emergency Medicine | Admitting: Emergency Medicine

## 2019-02-25 ENCOUNTER — Emergency Department: Payer: No Typology Code available for payment source

## 2019-02-25 DIAGNOSIS — R0789 Other chest pain: Secondary | ICD-10-CM | POA: Diagnosis present

## 2019-02-25 DIAGNOSIS — J449 Chronic obstructive pulmonary disease, unspecified: Secondary | ICD-10-CM | POA: Insufficient documentation

## 2019-02-25 DIAGNOSIS — F17210 Nicotine dependence, cigarettes, uncomplicated: Secondary | ICD-10-CM | POA: Diagnosis not present

## 2019-02-25 DIAGNOSIS — I2510 Atherosclerotic heart disease of native coronary artery without angina pectoris: Secondary | ICD-10-CM | POA: Insufficient documentation

## 2019-02-25 DIAGNOSIS — R05 Cough: Secondary | ICD-10-CM | POA: Diagnosis not present

## 2019-02-25 DIAGNOSIS — E119 Type 2 diabetes mellitus without complications: Secondary | ICD-10-CM | POA: Diagnosis not present

## 2019-02-25 DIAGNOSIS — R079 Chest pain, unspecified: Secondary | ICD-10-CM

## 2019-02-25 DIAGNOSIS — M546 Pain in thoracic spine: Secondary | ICD-10-CM | POA: Insufficient documentation

## 2019-02-25 HISTORY — DX: Atherosclerotic heart disease of native coronary artery without angina pectoris: I25.10

## 2019-02-25 LAB — BASIC METABOLIC PANEL
Anion gap: 10 (ref 5–15)
BUN: 14 mg/dL (ref 6–20)
CO2: 22 mmol/L (ref 22–32)
Calcium: 8.3 mg/dL — ABNORMAL LOW (ref 8.9–10.3)
Chloride: 105 mmol/L (ref 98–111)
Creatinine, Ser: 0.65 mg/dL (ref 0.61–1.24)
GFR calc Af Amer: >60 mL/min (ref >60)
GFR calc non Af Amer: >60 mL/min (ref >60)
Glucose, Bld: 183 mg/dL — ABNORMAL HIGH (ref 70–99)
Potassium: 4 mmol/L (ref 3.5–5.1)
Sodium: 137 mmol/L (ref 135–145)

## 2019-02-25 LAB — CBC
HCT: 43.8 % (ref 39.0–52.0)
Hemoglobin: 13.8 g/dL (ref 13.0–17.0)
MCH: 28.5 pg (ref 26.0–34.0)
MCHC: 31.5 g/dL (ref 30.0–36.0)
MCV: 90.3 fL (ref 80.0–100.0)
Platelets: 169 10*3/uL (ref 150–400)
RBC: 4.85 MIL/uL (ref 4.22–5.81)
RDW: 14.7 % (ref 11.5–15.5)
WBC: 11.2 10*3/uL — ABNORMAL HIGH (ref 4.0–10.5)
nRBC: 0 % (ref 0.0–0.2)

## 2019-02-25 LAB — TROPONIN I: Troponin I: <0.03 ng/mL (ref <0.03)

## 2019-02-25 MED ORDER — PREDNISONE 10 MG (21) PO TBPK: ORAL_TABLET | ORAL | 0 refills | Status: DC

## 2019-02-25 MED ORDER — IPRATROPIUM-ALBUTEROL 0.5-2.5 (3) MG/3ML IN SOLN
3.0000 mL | Freq: Once | RESPIRATORY_TRACT | Status: AC
  Administered 2019-02-25: 11:00:00 3 mL via RESPIRATORY_TRACT
  Filled 2019-02-25: qty 3

## 2019-02-25 NOTE — Telephone Encounter (Signed)
See previous call.  Patient stated he feels he has pluerisy. He stated the EMTs told him he had this as well.   No fever, no chills. He did have chest pain yesterday and hurts when he breaths.   Patient was instructed to call Dr.Lada PCP or go to the emergency room. He states he feels he had the corona virus back in February as he was very sick but no one would test him for it.   Patient stated he would call Dr.Ladas office.

## 2019-02-25 NOTE — Telephone Encounter (Signed)
Patient is calling again and is needing a doctors note for work and would also like someone to call him he has some questions as well. Please call patient and advise.

## 2019-02-25 NOTE — ED Triage Notes (Signed)
Pt in via POV, reports right lower chest pain, worse with cough, states his job requiring he be seen by a doctor before returning to work.  Ambulatory to triage, NAD noted at this time.

## 2019-02-25 NOTE — ED Provider Notes (Signed)
[facility] Emergency Department Provider Note       Time seen: ----------------------------------------- 10:05 AM on 02/25/2019 -----------------------------------------   I have reviewed the triage vital signs and the nursing notes.  HISTORY   Chief Complaint Chest Pain    HPI Joel Page is a 48 y.o. male with a history of allergies, coronary artery disease, degenerative disc disease, diabetes, who presents to the ED for right lower chest pain worse with cough.  Patient states his job requires he be seen by Dr. before returning to work.  Coughing fit and feels like there is fluid in the base of his right chest.  Reports he had a significant illness in February.  Earlier his left arm felt hot as well.  Past Medical History:  Diagnosis Date  . Allergy   . Coronary artery disease   . DDD (degenerative disc disease)   . Diabetes mellitus   . Insomnia   . Lumbar stenosis   . Retinopathy due to secondary diabetes ([place]) 06/24/12   both eyes    Patient Active Problem List   Diagnosis Date Noted  . Hyperlipidemia LDL goal <100 07/04/2016  . Obesity 06/29/2016  . Back pain at L4-L5 level 04/07/2016  . Cough 03/30/2016  . Medication monitoring encounter 03/30/2016  . COPD suggested by initial evaluation ([place]) 12/22/2015  . 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  . Diabetes mellitus type 2, uncontrolled ([place]) 05/11/2015  . Current smoker 05/11/2015  . Hemorrhoids, internal 05/11/2015  . Arthralgia of hip 05/11/2015  . Adiposity 05/11/2015  . 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  . Restless leg 05/11/2015  . Trochanteric bursitis of right hip 06/17/2013  . Sciatica 03/19/2012  . Lumbar degenerative disc disease 03/19/2012    Past Surgical History:  Procedure Laterality Date  . EXCISIONAL HEMORRHOIDECTOMY  2012  . ORIF FEMUR FRACTURE  1989  . SPHINCTEROTOMY    . TOOTH EXTRACTION       Allergies Topiramate; Trazodone; Baclofen; and Diclofenac sodium  Social History Social History   Tobacco Use  . Smoking status: Current Some Day Smoker    Packs/day: 0.50    Types: Cigarettes  . Smokeless tobacco: Current User  Substance Use Topics  . Alcohol use: Yes    Alcohol/week: 0.0 standard drinks  . Drug use: Never   Review of Systems Constitutional: Negative for fever. Cardiovascular: Negative for chest pain. Respiratory: Positive for cough Gastrointestinal: Negative for abdominal pain, vomiting and diarrhea. Musculoskeletal: Positive for back pain Skin: Negative for rash. Neurological: Negative for headaches, focal weakness or numbness.  All systems negative/normal/unremarkable except as stated in the HPI  ____________________________________________   PHYSICAL EXAM:  VITAL SIGNS: ED Triage Vitals  Enc Vitals Group     BP 02/25/19 1001 140/69     Pulse Rate 02/25/19 1001 90     Resp 02/25/19 1001 16     Temp 02/25/19 1001 98.5 F (36.9 C)     Temp Source 02/25/19 1001 Oral     SpO2 02/25/19 1001 96 %     Weight 02/25/19 0958 235 lb (106.6 kg)     Height 02/25/19 0958 5\' 9"  (1.753 m)     Head Circumference --      Peak Flow --      Pain Score 02/25/19 0958 0     Pain Loc --      Pain Edu? --      Excl. in [place]? --    Constitutional: Alert and  oriented. Well appearing and in no distress. Eyes: Conjunctivae are normal. Normal extraocular movements. ENT      Head: Normocephalic and atraumatic.      Nose: No congestion/rhinnorhea.      Mouth/Throat: Mucous membranes are moist.      Neck: No stridor. Cardiovascular: Normal rate, regular rhythm. No murmurs, rubs, or gallops. Respiratory: Normal respiratory effort without tachypnea nor retractions. Breath sounds are clear and equal bilaterally.  Right basilar rhonchi Gastrointestinal: Soft and nontender. Normal bowel sounds Musculoskeletal: Nontender with normal range of motion in extremities. No  lower extremity tenderness nor edema. Neurologic:  Normal speech and language. No gross focal neurologic deficits are appreciated.  Skin:  Skin is warm, dry and intact. No rash noted. Psychiatric: Mood and affect are normal. Speech and behavior are normal.  ____________________________________________  EKG: Interpreted by me.  Sinus rhythm with a rate of 88 bpm, normal PR interval, normal QRS, normal QT  ____________________________________________  ED COURSE:  As part of my medical decision making, I reviewed the following data within the [place] History obtained from family if available, nursing notes, old chart and ekg, as well as notes from prior ED visits. Patient presented for cough and right sided back pain, we will assess with labs and imaging as indicated at this time.   Procedures  Ulis Kaps was evaluated in Emergency Department on 02/25/2019 for the symptoms described in the history of present illness. He was evaluated in the context of the global COVID-19 pandemic, which necessitated consideration that the patient might be at risk for infection with the SARS-CoV-2 virus that causes COVID-19. Institutional protocols and algorithms that pertain to the evaluation of patients at risk for COVID-19 are in a state of rapid change based on information released by regulatory bodies including the CDC and federal and state organizations. These policies and algorithms were followed during the patient's care in the ED.  ____________________________________________   LABS (pertinent positives/negatives)  Labs Reviewed  BASIC METABOLIC PANEL - Abnormal; Notable for the following components:      Result Value   Glucose, Bld 183 (*)    Calcium 8.3 (*)    All other components within normal limits  CBC - Abnormal; Notable for the following components:   WBC 11.2 (*)    All other components within normal limits  TROPONIN I    RADIOLOGY Images were viewed by  me  Chest x-ray Does not reveal any acute process, no pleural fluid ____________________________________________   DIFFERENTIAL DIAGNOSIS   Bronchitis, asthma, bronchospasm, pneumonia, pneumothorax  FINAL ASSESSMENT AND PLAN  Cough, back pain   Plan: The patient had presented for cough and right sided posterior thoracic pain. Patient's labs are reassuring. Patient's imaging was unremarkable.  Patient received a DuoNeb here and will be discharged with prednisone taper.  He is cleared for outpatient follow-up.   Laurence Aly, MD    Note: This note was generated in part or whole with voice recognition software. Voice recognition is usually quite accurate but there are transcription errors that can and very often do occur. I apologize for any typographical errors that were not detected and corrected.     Earleen Newport, MD 02/25/19 1118

## 2019-02-26 ENCOUNTER — Ambulatory Visit: Payer: Self-pay

## 2019-02-26 ENCOUNTER — Ambulatory Visit (INDEPENDENT_AMBULATORY_CARE_PROVIDER_SITE_OTHER): Payer: PRIVATE HEALTH INSURANCE | Admitting: Nurse Practitioner

## 2019-02-26 ENCOUNTER — Other Ambulatory Visit: Payer: Self-pay

## 2019-02-26 ENCOUNTER — Encounter: Payer: Self-pay | Admitting: Nurse Practitioner

## 2019-02-26 ENCOUNTER — Telehealth: Payer: Self-pay | Admitting: Family Medicine

## 2019-02-26 ENCOUNTER — Encounter: Payer: Self-pay | Admitting: Family Medicine

## 2019-02-26 VITALS — HR 80 | Temp 100.5°F | Resp 16

## 2019-02-26 DIAGNOSIS — J302 Other seasonal allergic rhinitis: Secondary | ICD-10-CM

## 2019-02-26 DIAGNOSIS — R059 Cough, unspecified: Secondary | ICD-10-CM

## 2019-02-26 DIAGNOSIS — R05 Cough: Secondary | ICD-10-CM

## 2019-02-26 DIAGNOSIS — R509 Fever, unspecified: Secondary | ICD-10-CM

## 2019-02-26 MED ORDER — ACETAMINOPHEN 500 MG PO TABS: 1000.0000 mg | ORAL_TABLET | Freq: Three times a day (TID) | ORAL | 0 refills | Status: DC | PRN

## 2019-02-26 MED ORDER — PROMETHAZINE-DM 6.25-15 MG/5ML PO SYRP: 2.5000 mL | ORAL_SOLUTION | Freq: Four times a day (QID) | ORAL | 0 refills | Status: DC | PRN

## 2019-02-26 NOTE — Telephone Encounter (Signed)
Appointment set up with Joel Page

## 2019-02-26 NOTE — Telephone Encounter (Signed)
Yes please; Patient is out of work until 72 hours of no fever. Will be provided clearance to work note as well.  Thanks

## 2019-02-26 NOTE — Telephone Encounter (Signed)
Pt called to request return to work note Emailed to [email or web address].  Office notified. Note routed per [facility] request.

## 2019-02-26 NOTE — Progress Notes (Signed)
Virtual Visit via Video Note  I connected with@ on 02/26/19 at  9:40 AM EDT by a video enabled telemedicine application and verified that I am speaking with the correct person using two identifiers.   Staff discussed the limitations of evaluation and management by telemedicine and the availability of in person appointments. The patient expressed understanding and agreed to proceed.  Patient location: home  My location: home office Other people present:  None  HPI  Reviewed ER note from [facility] yesterday. Patient was evaluated for right sided chest pain and cough. No anemia- mild increase in WBC, and improved from last month.  Low calcium. Negative troponin. Chest x-ray was clear showing lungs were well aerated bilaterally. ER noted some basilar rhonchi- patient was given a duoneb in the ER and prescribed a prednisone taper.  EKG- normal sinus rhythm.   Patient has cervical spondylosis with occasional paresthesias in arms related to this. Has been evaluated by orthopedic who offered surgery which he declined at this time. States yesterday he was driving to work and drank his coke zero really quickly and started coughing and had some right sided chest pain with arm pain. Felt it was his chronic arm pain but chest pain and coughing was new, job made him call 911. States symptoms resolved upon their arrival.   States ate baseline has a smokers cough but this morning woke up with cough worse than normal and woke up in night sweats, sore throat and hoarse voice. He is concerned he caught something from the ER. Endorses chronic mild shortness of breath unchanged. Endorses allergies states has been bad the last week.   Cut down smoking from over a ppd to less than a ppd   PHQ2/9: Depression screen [facility] 2/9 02/26/2019 01/20/2019 12/05/2018 07/09/2018 06/29/2016  Decreased Interest 0 0 0 0 0  Down, Depressed, Hopeless 0 0 0 0 0  PHQ - 2 Score 0 0 0 0 0  Altered sleeping 0 0 0 - -  Tired, decreased energy 0 0  0 - -  Change in appetite 0 0 0 - -  Feeling bad or failure about yourself  0 0 0 - -  Trouble concentrating 0 0 0 - -  Moving slowly or fidgety/restless 0 0 0 - -  Suicidal thoughts 0 0 0 - -  PHQ-9 Score 0 0 0 - -  Difficult doing work/chores Not difficult at all Not difficult at all Not difficult at all - -     PHQ reviewed. Negative  Patient Active Problem List   Diagnosis Date Noted  . Hyperlipidemia LDL goal <100 07/04/2016  . Obesity 06/29/2016  . Back pain at L4-L5 level 04/07/2016  . Cough 03/30/2016  . Medication monitoring encounter 03/30/2016  . COPD suggested by initial evaluation ([place]) 12/22/2015  . 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  . Diabetes mellitus type 2, uncontrolled ([place]) 05/11/2015  . Current smoker 05/11/2015  . Hemorrhoids, internal 05/11/2015  . Arthralgia of hip 05/11/2015  . Adiposity 05/11/2015  . 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  . Restless leg 05/11/2015  . Trochanteric bursitis of right hip 06/17/2013  . Sciatica 03/19/2012  . Lumbar degenerative disc disease 03/19/2012    Past Medical History:  Diagnosis Date  . Allergy   . Coronary artery disease   . DDD (degenerative disc disease)   . Diabetes mellitus   . Insomnia   . Lumbar stenosis   . Retinopathy due to secondary diabetes ([place]) 06/24/12  both eyes    Past Surgical History:  Procedure Laterality Date  . EXCISIONAL HEMORRHOIDECTOMY  2012  . ORIF FEMUR FRACTURE  1989  . SPHINCTEROTOMY    . TOOTH EXTRACTION      Social History   Tobacco Use  . Smoking status: Current Some Day Smoker    Packs/day: 0.50    Types: Cigarettes  . Smokeless tobacco: Current User  Substance Use Topics  . Alcohol use: Yes    Alcohol/week: 0.0 standard drinks     Current Outpatient Medications:  .  albuterol (PROVENTIL HFA;VENTOLIN HFA) 108 (90 Base) MCG/ACT inhaler, Inhale 2-4 puffs by mouth every 4 hours as needed for wheezing, cough, and/or  shortness of breath, Disp: 1 Inhaler, Rfl: 1 .  atorvastatin (LIPITOR) 20 MG tablet, Take 1 tablet (20 mg total) by mouth at bedtime., Disp: 90 tablet, Rfl: 1 .  benzonatate (TESSALON) 200 MG capsule, Take 1 capsule (200 mg total) by mouth 2 (two) times daily as needed for cough., Disp: 20 capsule, Rfl: 0 .  Blood Glucose Monitoring Suppl (BAYER CONTOUR NEXT MONITOR) w/Device KIT, 1 Device by Does not apply route 1 day or 1 dose. DX: DM E11.5, Disp: 1 kit, Rfl: 0 .  Cinnamon 500 MG capsule, Take 1,000 mg by mouth daily., Disp: , Rfl:  .  cyclobenzaprine (FLEXERIL) 10 MG tablet, Take 1 tablet (10 mg total) by mouth 3 (three) times daily as needed for muscle spasms., Disp: 30 tablet, Rfl: 0 .  Echinacea 400 MG CAPS, Take 2 capsules by mouth., Disp: , Rfl:  .  indomethacin (INDOCIN) 25 MG capsule, Take 1 capsule (25 mg total) by mouth 3 (three) times daily with meals., Disp: 30 capsule, Rfl: 0 .  metFORMIN (GLUCOPHAGE-XR) 500 MG 24 hr tablet, One by mouth daily; appointment needed, Disp: 180 tablet, Rfl: 0 .  Omega-3 Fatty Acids (FISH OIL) 1000 MG CAPS, Take by mouth., Disp: , Rfl:  .  predniSONE (STERAPRED UNI-PAK 21 TAB) 10 MG (21) TBPK tablet, Dispense taper pack as directed (Patient not taking: Reported on 02/26/2019), Disp: 21 tablet, Rfl: 0  Allergies  Allergen Reactions  . Topiramate Other (See Comments)    White light in peripheral vision  . Trazodone Other (See Comments)    Makes tingling & numbness in hands & arms worse; causes an erection.  . Baclofen Itching  . Diclofenac Sodium Other (See Comments)    Raised blood sugar     ROS  No other specific complaints in a complete review of systems (except as listed in HPI above).  Objective  Vitals:   02/26/19 0922  Pulse: (!) 116  Resp: 16  Temp: (!) 100.5 F (38.1 C)    There is no height or weight on file to calculate BMI.  Nursing Note and Vital Signs reviewed.  Physical Exam   Constitutional: Patient appears  well-developed and well-nourished. No distress.  HENT: Head: Normocephalic and atraumatic. Cardiovascular: Elevated rate while walking around house - 116bpm; 80 bpm when seated.  Pulmonary/Chest: Effort normal, walking around house talking without issue. Occasional dry cough.  Musculoskeletal: Normal range of motion,  Neurological: he is alert and oriented to person, place, and time. speech and gait are normal.  Psychiatric: Patient has a normal mood and affect. behavior is normal. Judgment and thought content normal.    Assessment & Plan  1. Cough Discussed holding off on starting prednisone, he states increases sugars significantly, cut down/out smoking, use humidifier, take daily Claritin  - promethazine-dextromethorphan (PROMETHAZINE-DM)  6.25-15 MG/5ML syrup; Take 2.5 mLs by mouth 4 (four) times daily as needed.  Dispense: 118 mL; Refill: 0  2. Fever, unspecified fever cause Take acetaminophen as needed for fevers.  - acetaminophen (TYLENOL) 500 MG tablet; Take 2 tablets (1,000 mg total) by mouth every 8 (eight) hours as needed for fever.  Dispense: 30 tablet; Refill: 0  3. Seasonal allergies Take daily claritin     Follow Up Instructions:   follow up in 4 days to discuss return to work note  I discussed the assessment and treatment plan with the patient. The patient was provided an opportunity to ask questions and all were answered. The patient agreed with the plan and demonstrated an understanding of the instructions.   The patient was advised to call back or seek an in-person evaluation if the symptoms worsen or if the condition fails to improve as anticipated.  I provided 29 minutes of non-face-to-face time during this encounter.   Fredderick Severance, NP

## 2019-02-26 NOTE — Telephone Encounter (Signed)
Pt called stating that he woke today feeling sweaty hand hot with a dry cough.  He states that he had been dealing with pleuracy since February and it had gotten better. He states that this week at work he had some chest pain which caused a visit to the hospital on Tuesday.  He states that his work would not let him return without a note so he went back to the hospital a 2nd time. He started a cough last night that woke him from sleep He states the cough caused him pain. He denies SOB. He feels he has fever but is unable to take his temperature. He has Hx of diabetes and asthmatic bronchitis. He has been exposed to public through his work as [employer] with his employer. Per protocol call was transferred to office for appointment.   Reason for Disposition . HIGH RISK patient (e.g., age > 7 years, diabetes, heart or lung disease, weak immune system)  Answer Assessment - Initial Assessment Questions 1. COVID-19 DIAGNOSIS: "Who made your Coronavirus (COVID-19) diagnosis?" "Was it confirmed by a positive lab test?" If not diagnosed by a HCP, ask "Are there lots of cases (community spread) where you live?" (See public health department website, if unsure)   * MAJOR community spread: high number of cases; numbers of cases are increasing; many people hospitalized.   * MINOR community spread: low number of cases; not increasing; few or no people hospitalized     [place]  work [employer] 2. ONSET: "When did the COVID-19 symptoms start?"      Today with cough and fever sweats 3. WORST SYMPTOM: "What is your worst symptom?" (e.g., cough, fever, shortness of breath, muscle aches)     Cough pain 4. COUGH: "How bad is the cough?"       Bad with pleuracy pain 5. FEVER: "Do you have a fever?" If so, ask: "What is your temperature, how was it measured, and when did it start?"     Hot sweaty unable to take temp 6. RESPIRATORY STATUS: "Describe your breathing?" (e.g., shortness of breath, wheezing, unable  to speak)     Wheezes tight chest 7. BETTER-SAME-WORSE: "Are you getting better, staying the same or getting worse compared to yesterday?"  If getting worse, ask, "In what way?"    Yes worse pain and cough 8. HIGH RISK DISEASE: "Do you have any chronic medical problems?" (e.g., asthma, heart or lung disease, weak immune system, etc.)     Asthmatic bronchitis diabetic 9. PREGNANCY: "Is there any chance you are pregnant?" "When was your last menstrual period?"     No 10. OTHER SYMPTOMS: "Do you have any other symptoms?"  (e.g., runny nose, headache, sore throat, loss of smell)      Stuffy nose, dry throat, dry cough  Protocols used: CORONAVIRUS (COVID-19) DIAGNOSED OR SUSPECTED-A-AH

## 2019-02-26 NOTE — Telephone Encounter (Signed)
Letter has been created and sent to patient via mychart

## 2019-02-28 ENCOUNTER — Ambulatory Visit: Payer: Self-pay | Admitting: Family Medicine

## 2019-02-28 NOTE — Telephone Encounter (Signed)
Pt called in c/o not feeling any better since his virtual visit from 02/26/2019.   Still having chest pain with coughing, nasal congestion, fever over 100 and coughing. He is requesting to be tested for the COVID-19 coronavirus.   I let him know the office is closed now for the Good Friday holiday.   He really thinks he should be tested.   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   I suggested he go to the ED since his symptoms are not better and he is wanting to be tested for the virus.   He agreed to this since the office is not open again until Monday.    Reason for Disposition . [1] Continuous (nonstop) coughing interferes with work or school AND [2] no improvement using cough treatment per protocol  Answer Assessment - Initial Assessment Questions 1. LOCATION: "Where does it hurt?"       I still have fever.   100 or higher 100.7 this morning.    Chest pains are with coughing.    I had this pain when I did a virtual visit on Wednesday.   The chest pain has gotten better.   They gave me cough medicine and told me to take Tylenol.   I was not given antibiotics.    I've had 2 Z Paks since Feb 6th.    I think they should test me.  I keep getting a headache.   I had a lot of nasal congestion yesterday.  The nasal spray helps.  I'm having post nasal drip.    They told me to stay home for 4 days and observe my temperature.   They wanted to see me again in 72 hrs.    I just got up a few minutes ago.    2. RADIATION: "Does the pain go anywhere else?" (e.g., into neck, jaw, arms, back)     They gave me Tessalon Perles and an anti-inflammatory. 3. ONSET: "When did the chest pain begin?" (Minutes, hours or days)      *No Answer* 4. PATTERN "Does the pain come and go, or has it been constant since it started?"  "Does it get worse with exertion?"      *No Answer* 5. DURATION: "How long does it last" (e.g., seconds, minutes, hours)     *No Answer* 6. SEVERITY: "How bad is  the pain?"  (e.g., Scale 1-10; mild, moderate, or severe)    - MILD (1-3): doesn't interfere with normal activities     - MODERATE (4-7): interferes with normal activities or awakens from sleep    - SEVERE (8-10): excruciating pain, unable to do any normal activities       *No Answer* 7. CARDIAC RISK FACTORS: "Do you have any history of heart problems or risk factors for heart disease?" (e.g., prior heart attack, angina; high blood pressure, diabetes, being overweight, high cholesterol, smoking, or strong family history of heart disease)     *No Answer* 8. PULMONARY RISK FACTORS: "Do you have any history of lung disease?"  (e.g., blood clots in lung, asthma, emphysema, birth control pills)     *No Answer* 9. CAUSE: "What do you think is causing the chest pain?"     *No Answer* 10. OTHER SYMPTOMS: "Do you have any other symptoms?" (e.g., dizziness, nausea, vomiting, sweating, fever, difficulty breathing, cough)       *No Answer* 11. PREGNANCY: "Is there any chance you are pregnant?" "When was  your last menstrual period?"       *No Answer*  Answer Assessment - Initial Assessment Questions 1. COVID-19 DIAGNOSIS: "Who made your Coronavirus (COVID-19) diagnosis?" "Was it confirmed by a positive lab test?" If not diagnosed by a HCP, ask "Are there lots of cases (community spread) where you live?" (See public health department website, if unsure)   * MAJOR community spread: high number of cases; numbers of cases are increasing; many people hospitalized.   * MINOR community spread: low number of cases; not increasing; few or no people hospitalized     Pt called in c/o not getting better since his virtual visit on Wednesday.   Still coughing and having pain in chest with coughing.   Having nasal congestion, headache.   He said they gave him a cough medicine and Tylenol.    2. ONSET: "When did the COVID-19 symptoms start?"      Been going on since Feb 6th.    3. WORST SYMPTOM: "What is your worst  symptom?" (e.g., cough, fever, shortness of breath, muscle aches)     Hurting in my chest when I cough.   4. COUGH: "How bad is the cough?"       I can't sleep without drugging myself to sleep due to the cough. 5. FEVER: "Do you have a fever?" If so, ask: "What is your temperature, how was it measured, and when did it start?"     Yes.   It's been over 100.   This morning it was 100.7 6. RESPIRATORY STATUS: "Describe your breathing?" (e.g., shortness of breath, wheezing, unable to speak)      Coughing, congestion..   7. BETTER-SAME-WORSE: "Are you getting better, staying the same or getting worse compared to yesterday?"  If getting worse, ask, "In what way?"     About the same except for the pain in my chest when I cough.   8. HIGH RISK DISEASE: "Do you have any chronic medical problems?" (e.g., asthma, heart or lung disease, weak immune system, etc.)     Not asked since just seen via virtual visit at the office.   He stated   "It's all in my chart". 9. PREGNANCY: "Is there any chance you are pregnant?" "When was your last menstrual period?"     N/A 10. OTHER SYMPTOMS: "Do you have any other symptoms?"  (e.g., runny nose, headache, sore throat, loss of smell)       Nasal congestion, post nasal drip, cough, fever  Protocols used: CORONAVIRUS (COVID-19) DIAGNOSED OR SUSPECTED-A-AH, CHEST PAIN-A-AH

## 2019-03-03 ENCOUNTER — Ambulatory Visit (INDEPENDENT_AMBULATORY_CARE_PROVIDER_SITE_OTHER): Payer: PRIVATE HEALTH INSURANCE | Admitting: Nurse Practitioner

## 2019-03-03 ENCOUNTER — Encounter: Payer: Self-pay | Admitting: Nurse Practitioner

## 2019-03-03 ENCOUNTER — Ambulatory Visit: Payer: Self-pay | Admitting: *Deleted

## 2019-03-03 ENCOUNTER — Other Ambulatory Visit: Payer: Self-pay

## 2019-03-03 VITALS — HR 88 | Temp 100.2°F | Resp 16

## 2019-03-03 DIAGNOSIS — R509 Fever, unspecified: Secondary | ICD-10-CM

## 2019-03-03 DIAGNOSIS — R05 Cough: Secondary | ICD-10-CM

## 2019-03-03 DIAGNOSIS — R062 Wheezing: Secondary | ICD-10-CM

## 2019-03-03 DIAGNOSIS — R059 Cough, unspecified: Secondary | ICD-10-CM

## 2019-03-03 DIAGNOSIS — R0602 Shortness of breath: Secondary | ICD-10-CM

## 2019-03-03 NOTE — Telephone Encounter (Signed)
Has a virtual visit on 02/28/19 for fever. Called to report he continues to have fever since that visit.  High today 101.2 low 99.5, today oral temp is 100.2 without fever reducer. Dry cough mostly. Feels sore around his right rib area mostly coughing but does feel the soreness by touch also.Will hear some wheezing at times. Is a smoker. Has been quarantined except for his family since the visit on the 10th. No SOB/difficulty breathing. Transferred to PCP at this time for follow if needed.

## 2019-03-03 NOTE — Telephone Encounter (Signed)
See added note to 02/28/19.

## 2019-03-03 NOTE — Progress Notes (Signed)
Virtual Visit via Video Note  I connected with [name] on 03/03/19 at  1:00 PM EDT by a video enabled telemedicine application and verified that I am speaking with the correct person using two identifiers.   I discussed the limitations of evaluation and management by telemedicine and the availability of in person appointments. The patient expressed understanding and agreed to proceed. Patients location: home My Location: work office Others present: None   History of Present Illness:  Patient was seen virtually on 02/26/2019, had recently when to ER for right sided chest pain. Chest xray was clear. Was continuing to have cough and fever discussed likely viral in nature, self-quarantine instructions reviewed as well as recommended to take acetaminophen and promethazine DM and guaifenesin. Patient states he has not been taking any of those but has been taking zinc, tonic water.  States cough is productive occasionally- thin clear sputum. States cough has improved.  Patient endorses wheezing at night when he lays down or goes up and down the stairs and it improves significantly when he takes albuterol inhaler.  States he has taken azithromycin twice since January when this illness and states symptoms improved.    Observations/Objective:  Alert, able to speak in full sentences without difficulty.  No rash, bruising, or chest tenderness, states with deep inspiration under 1 inch underneath right nipple states can have some mild pain.  Equal expansion- chest rise and fall.   Assessment and Plan: 1. Shortness of breath Much improved, states barely noticeable just mild wheezing with going up and down stairs   2. Wheezing Mild wheezing with moderate exertion and when laying down, improvement with albuterol   3. Cough Improved   4. Fever, unspecified fever cause  100-101.51F has not been taking acetaminophen  All symptoms appear to be improving, discussed taking OTC meds  acetaminophen and mucinex, humidifier. If fevers are not resolving, shortness of breath returns please let us know will consider antibiotic therapy, however as symptoms are improving still consider viral therapy still recommend quarantine until 72 hours fever free   Follow Up Instructions:  Follow up in 10 days.   I discussed the assessment and treatment plan with the patient. The patient was provided an opportunity to ask questions and all were answered. The patient agreed with the plan and demonstrated an understanding of the instructions.   The patient was advised to call back or seek an in-person evaluation if the symptoms worsen or if the condition fails to improve as anticipated.  I provided 19 minutes of non-face-to-face time during this encounter.   Fredderick Severance, NP

## 2019-03-03 NOTE — Telephone Encounter (Signed)
Re-assessed. See virtual visit from today.

## 2019-03-08 ENCOUNTER — Encounter: Payer: Self-pay | Admitting: Family Medicine

## 2019-03-13 ENCOUNTER — Ambulatory Visit (INDEPENDENT_AMBULATORY_CARE_PROVIDER_SITE_OTHER): Payer: PRIVATE HEALTH INSURANCE | Admitting: Nurse Practitioner

## 2019-03-13 ENCOUNTER — Encounter: Payer: Self-pay | Admitting: Nurse Practitioner

## 2019-03-13 ENCOUNTER — Other Ambulatory Visit: Payer: Self-pay

## 2019-03-13 VITALS — BP 138/80 | HR 100 | Temp 98.1°F | Resp 18 | Ht 69.0 in | Wt 237.3 lb

## 2019-03-13 DIAGNOSIS — R05 Cough: Secondary | ICD-10-CM

## 2019-03-13 DIAGNOSIS — R059 Cough, unspecified: Secondary | ICD-10-CM

## 2019-03-13 DIAGNOSIS — Z20822 Contact with and (suspected) exposure to covid-19: Secondary | ICD-10-CM

## 2019-03-13 DIAGNOSIS — R0602 Shortness of breath: Secondary | ICD-10-CM | POA: Diagnosis not present

## 2019-03-13 DIAGNOSIS — R6889 Other general symptoms and signs: Secondary | ICD-10-CM

## 2019-03-13 DIAGNOSIS — L02214 Cutaneous abscess of groin: Secondary | ICD-10-CM | POA: Diagnosis not present

## 2019-03-13 MED ORDER — SULFAMETHOXAZOLE-TRIMETHOPRIM 800-160 MG PO TABS: 1.0000 | ORAL_TABLET | Freq: Two times a day (BID) | ORAL | 0 refills | Status: DC

## 2019-03-13 NOTE — Progress Notes (Signed)
Virtual Visit via Video Note  I connected with Joel Page on 03/13/19 at 11:40 AM EDT by a video enabled telemedicine application and verified that I am speaking with the correct person using two identifiers.   Staff discussed the limitations of evaluation and management by telemedicine and the availability of in person appointments. The patient expressed understanding and agreed to proceed.  Patient location: office  My location: Home office Other people present:  none HPI  Patient presents for follow-up on possible COVID19 with shortness of breath, fever, cough  States his symptoms had initially gotten better then worsened about a week ago and then have improved since then. States when he started coughing again and chest pain got worse with that. He had been out in the pollen that day. States his cough is mild now but getting up phlegm. He is still taking mucinex twice a day. Denies shortness of breath.   Patient additionally notes abscess to right groin that has been intermittently fluctuating in size for the past few weeks. States it initially was draining but no longer draining, hard and indurated, has two spots now. Tender to touch.   PHQ2/9: Depression screen [facility] 2/9 03/13/2019 03/03/2019 02/26/2019 01/20/2019 12/05/2018  Decreased Interest 0 0 0 0 0  Down, Depressed, Hopeless 0 0 0 0 0  PHQ - 2 Score 0 0 0 0 0  Altered sleeping 0 0 0 0 0  Tired, decreased energy 0 0 0 0 0  Change in appetite 0 0 0 0 0  Feeling bad or failure about yourself  0 0 0 0 0  Trouble concentrating 0 0 0 0 0  Moving slowly or fidgety/restless 0 0 0 0 0  Suicidal thoughts 0 - 0 0 0  PHQ-9 Score 0 0 0 0 0  Difficult doing work/chores Not difficult at all Not difficult at all Not difficult at all Not difficult at all Not difficult at all    PHQ reviewed. Negative   Patient Active Problem List   Diagnosis Date Noted  . Hyperlipidemia LDL goal <100 07/04/2016  . Obesity 06/29/2016  . Back pain at L4-L5 level  04/07/2016  . Cough 03/30/2016  . Medication monitoring encounter 03/30/2016  . COPD suggested by initial evaluation ([place]) 12/22/2015  . 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  . Diabetes mellitus type 2, uncontrolled ([place]) 05/11/2015  . Current smoker 05/11/2015  . Hemorrhoids, internal 05/11/2015  . Arthralgia of hip 05/11/2015  . Adiposity 05/11/2015  . 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  . Restless leg 05/11/2015  . Trochanteric bursitis of right hip 06/17/2013  . Sciatica 03/19/2012  . Lumbar degenerative disc disease 03/19/2012    Past Medical History:  Diagnosis Date  . Allergy   . Coronary artery disease   . DDD (degenerative disc disease)   . Diabetes mellitus   . Insomnia   . Lumbar stenosis   . 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 EXCISIONAL HEMORRHOIDECTOMY  2012  . ORIF FEMUR FRACTURE  1989  . SPHINCTEROTOMY    . TOOTH EXTRACTION      Social History   Tobacco Use  . Smoking status: Current Some Day Smoker    Packs/day: 0.50    Types: Cigarettes  . Smokeless tobacco: Current User  Substance Use Topics  . Alcohol use: Yes    Alcohol/week: 0.0 standard drinks     Current  Outpatient Medications:  .  acetaminophen (TYLENOL) 500 MG tablet, Take 2 tablets (1,000 mg total) by mouth every 8 (eight) hours as needed for fever., Disp: 30 tablet, Rfl: 0 .  albuterol (PROVENTIL HFA;VENTOLIN HFA) 108 (90 Base) MCG/ACT inhaler, Inhale 2-4 puffs by mouth every 4 hours as needed for wheezing, cough, and/or shortness of breath, Disp: 1 Inhaler, Rfl: 1 .  atorvastatin (LIPITOR) 20 MG tablet, Take 1 tablet (20 mg total) by mouth at bedtime., Disp: 90 tablet, Rfl: 1 .  Blood Glucose Monitoring Suppl (BAYER CONTOUR NEXT MONITOR) w/Device KIT, 1 Device by Does not apply route 1 day or 1 dose. DX: DM E11.5, Disp: 1 kit, Rfl: 0 .  Cinnamon 500 MG capsule,  Take 1,000 mg by mouth daily., Disp: , Rfl:  .  cyclobenzaprine (FLEXERIL) 10 MG tablet, Take 1 tablet (10 mg total) by mouth 3 (three) times daily as needed for muscle spasms., Disp: 30 tablet, Rfl: 0 .  Echinacea 400 MG CAPS, Take 2 capsules by mouth., Disp: , Rfl:  .  metFORMIN (GLUCOPHAGE-XR) 500 MG 24 hr tablet, One by mouth daily; appointment needed, Disp: 180 tablet, Rfl: 0 .  OVER THE COUNTER MEDICATION, Tonic water, Disp: , Rfl:  .  promethazine-dextromethorphan (PROMETHAZINE-DM) 6.25-15 MG/5ML syrup, Take 2.5 mLs by mouth 4 (four) times daily as needed., Disp: 118 mL, Rfl: 0 .  QUERCETIN PO, Take by mouth., Disp: , Rfl:  .  ZINC-MAGNESIUM ASPART-VIT B6 PO, Take by mouth., Disp: , Rfl:  .  indomethacin (INDOCIN) 25 MG capsule, Take 1 capsule (25 mg total) by mouth 3 (three) times daily with meals. (Patient not taking: Reported on 03/13/2019), Disp: 30 capsule, Rfl: 0 .  Omega-3 Fatty Acids (FISH OIL) 1000 MG CAPS, Take by mouth., Disp: , Rfl:  .  sulfamethoxazole-trimethoprim (BACTRIM DS) 800-160 MG tablet, Take 1 tablet by mouth 2 (two) times daily., Disp: 14 tablet, Rfl: 0  Allergies  Allergen Reactions  . Topiramate Other (See Comments)    White light in peripheral vision  . Trazodone Other (See Comments)    Makes tingling & numbness in hands & arms worse; causes an erection.  . Baclofen Itching  . Diclofenac Sodium Other (See Comments)    Raised blood sugar     ROS   No other specific complaints in a complete review of systems (except as listed in HPI above).  Objective  Vitals:   03/13/19 1139  BP: 138/80  Pulse: 100  Resp: 18  Temp: 98.1 F (36.7 C)  TempSrc: Oral  SpO2: 97%  Weight: 237 lb 4.8 oz (107.6 kg)  Height: _0  (1.753 m)    Body mass index is 35.04 kg/m.  Nursing Note and Vital Signs reviewed.  Physical Exam  Constitutional: Patient appears well-developed and well-nourished. No distress.  HENT: Head: Normocephalic and  atraumatic. Cardiovascular: Normal rate Pulmonary/Chest: Effort normal  Musculoskeletal: Normal range of motion,  Neurological: he is alert and oriented to person, place, and time. speech and gait are normal.  Skin: 2 small red raised, indurated areas (induration per CMA) to right upper thigh Psychiatric: Patient has a normal mood and affect. behavior is normal. Judgment and thought content normal.    Assessment & Plan 1. Suspected Covid-19 Virus Infection Resolving symptoms, detailed discussion with patient about current knowledge base on disease  2. Shortness of breath resolved  3. Cough Improving   4. Abscess of groin, right Warm moist heat, follow-up if not improving  - sulfamethoxazole-trimethoprim (BACTRIM DS) 800-160  MG tablet; Take 1 tablet by mouth 2 (two) times daily.  Dispense: 14 tablet; Refill: 0    Follow Up Instructions:   Follow up if abscess becomes fluctuant is not draining, worsening pain, or new or concerning symptoms  I discussed the assessment and treatment plan with the patient. The patient was provided an opportunity to ask questions and all were answered. The patient agreed with the plan and demonstrated an understanding of the instructions.   The patient was advised to call back or seek an in-person evaluation if the symptoms worsen or if the condition fails to improve as anticipated.  I provided 23 minutes of non-face-to-face time during this encounter.   Fredderick Severance, NP

## 2019-04-08 ENCOUNTER — Encounter: Payer: Self-pay | Admitting: Family Medicine

## 2019-06-13 ENCOUNTER — Ambulatory Visit
Admission: RE | Admit: 2019-06-13 | Discharge: 2019-06-13 | Disposition: A | Discharge status: Home / Self Care | Payer: No Typology Code available for payment source | Source: Ambulatory Visit | Attending: Family Medicine | Admitting: Family Medicine

## 2019-06-13 ENCOUNTER — Other Ambulatory Visit: Payer: Self-pay

## 2019-06-13 ENCOUNTER — Encounter: Payer: Self-pay | Admitting: Family Medicine

## 2019-06-13 ENCOUNTER — Ambulatory Visit (INDEPENDENT_AMBULATORY_CARE_PROVIDER_SITE_OTHER): Payer: PRIVATE HEALTH INSURANCE | Admitting: Family Medicine

## 2019-06-13 ENCOUNTER — Other Ambulatory Visit
Admission: RE | Admit: 2019-06-13 | Discharge: 2019-06-13 | Disposition: A | Discharge status: Home / Self Care | Payer: No Typology Code available for payment source | Source: Ambulatory Visit | Attending: Family Medicine | Admitting: Family Medicine

## 2019-06-13 VITALS — BP 132/78 | HR 89 | Temp 97.3°F | Resp 14 | Ht 69.0 in | Wt 243.4 lb

## 2019-06-13 DIAGNOSIS — Z8619 Personal history of other infectious and parasitic diseases: Secondary | ICD-10-CM

## 2019-06-13 DIAGNOSIS — R079 Chest pain, unspecified: Secondary | ICD-10-CM

## 2019-06-13 DIAGNOSIS — Z8616 Personal history of COVID-19: Secondary | ICD-10-CM

## 2019-06-13 DIAGNOSIS — R1319 Other dysphagia: Secondary | ICD-10-CM

## 2019-06-13 DIAGNOSIS — E1165 Type 2 diabetes mellitus with hyperglycemia: Secondary | ICD-10-CM

## 2019-06-13 DIAGNOSIS — E6609 Other obesity due to excess calories: Secondary | ICD-10-CM | POA: Diagnosis not present

## 2019-06-13 DIAGNOSIS — R1310 Dysphagia, unspecified: Secondary | ICD-10-CM

## 2019-06-13 DIAGNOSIS — E785 Hyperlipidemia, unspecified: Secondary | ICD-10-CM

## 2019-06-13 DIAGNOSIS — Z6833 Body mass index (BMI) 33.0-33.9, adult: Secondary | ICD-10-CM

## 2019-06-13 LAB — CBC WITH DIFFERENTIAL/PLATELET
Abs Immature Granulocytes: 0.02 10*3/uL (ref 0.00–0.07)
Basophils Absolute: 0 10*3/uL (ref 0.0–0.1)
Basophils Relative: 0 %
Eosinophils Absolute: 0.1 10*3/uL (ref 0.0–0.5)
Eosinophils Relative: 1 %
HCT: 44.2 % (ref 39.0–52.0)
Hemoglobin: 14.1 g/dL (ref 13.0–17.0)
Immature Granulocytes: 0 %
Lymphocytes Relative: 16 %
Lymphs Abs: 1.8 10*3/uL (ref 0.7–4.0)
MCH: 28.2 pg (ref 26.0–34.0)
MCHC: 31.9 g/dL (ref 30.0–36.0)
MCV: 88.4 fL (ref 80.0–100.0)
Monocytes Absolute: 0.9 10*3/uL (ref 0.1–1.0)
Monocytes Relative: 7 %
Neutro Abs: 8.6 10*3/uL — ABNORMAL HIGH (ref 1.7–7.7)
Neutrophils Relative %: 76 %
Platelets: 176 10*3/uL (ref 150–400)
RBC: 5 MIL/uL (ref 4.22–5.81)
RDW: 15.2 % (ref 11.5–15.5)
WBC: 11.5 10*3/uL — ABNORMAL HIGH (ref 4.0–10.5)
nRBC: 0 % (ref 0.0–0.2)

## 2019-06-13 LAB — COMPREHENSIVE METABOLIC PANEL
ALT: 16 U/L (ref 0–44)
AST: 14 U/L — ABNORMAL LOW (ref 15–41)
Albumin: 4 g/dL (ref 3.5–5.0)
Alkaline Phosphatase: 91 U/L (ref 38–126)
Anion gap: 8 (ref 5–15)
BUN: 14 mg/dL (ref 6–20)
CO2: 22 mmol/L (ref 22–32)
Calcium: 8.4 mg/dL — ABNORMAL LOW (ref 8.9–10.3)
Chloride: 108 mmol/L (ref 98–111)
Creatinine, Ser: 0.71 mg/dL (ref 0.61–1.24)
GFR calc Af Amer: >60 mL/min (ref >60)
GFR calc non Af Amer: >60 mL/min (ref >60)
Glucose, Bld: 193 mg/dL — ABNORMAL HIGH (ref 70–99)
Potassium: 4.3 mmol/L (ref 3.5–5.1)
Sodium: 138 mmol/L (ref 135–145)
Total Bilirubin: 0.2 mg/dL — ABNORMAL LOW (ref 0.3–1.2)
Total Protein: 7 g/dL (ref 6.5–8.1)

## 2019-06-13 LAB — LIPID PANEL
Cholesterol: 149 mg/dL (ref 0–200)
HDL: 36 mg/dL — ABNORMAL LOW (ref >40)
LDL Cholesterol: 98 mg/dL (ref 0–99)
Total CHOL/HDL Ratio: 4.1 RATIO
Triglycerides: 74 mg/dL (ref <150)
VLDL: 15 mg/dL (ref 0–40)

## 2019-06-13 LAB — TROPONIN I (HIGH SENSITIVITY): Troponin I (High Sensitivity): 3 ng/L (ref <18)

## 2019-06-13 MED ORDER — ATORVASTATIN CALCIUM 20 MG PO TABS: 20.0000 mg | ORAL_TABLET | Freq: Every day | ORAL | 1 refills | Status: DC

## 2019-06-13 NOTE — Progress Notes (Addendum)
Name: Joel Page   MRN: [id number]    DOB: 1971-04-14   Date:06/13/2019       Progress Note  Subjective  Chief Complaint  Chief Complaint  Patient presents with  . Chest Pain    comes and goes    HPI  Pt presents with concern for intermittent chest pain since February 2020 when he was diagnosed with COVID-19.  He notes residual pleurisy in the right lower ribcage.  He has been having intermittent chest pain in the center of the chest with aching in the LEFT arm at the same time.  These pains usually last for a few minutes, then go away.  He does have HLD, COPD, obesity and DM.  He was told to go to the ER for this, but refused and scheduled an appointment with our clinic instead.    - He did go to the ER in April for the same pain.  Had negative troponin,elevated white count, negative CXR (1 view only). - Does have intermittent shortness of breath and cough - cough is worse first thing in the morning (is a daily smoker).  Shortness of breath often worse with lying flat (albuterol helps this significantly).  - Denies BLE edema  - Dysphagia - Has been intermittent for years; notes family history of the same.  We will refer to GI as he notes his father aspirated and died from lung infection secondary to this. Occasional choking episodes, but has never needed heimlich maneuver.   Patient Active Problem List   Diagnosis Date Noted  . Hyperlipidemia LDL goal <100 07/04/2016  . Obesity 06/29/2016  . Back pain at L4-L5 level 04/07/2016  . Cough 03/30/2016  . Medication monitoring encounter 03/30/2016  . COPD suggested by initial evaluation ([place]) 12/22/2015  . 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  . Diabetes mellitus type 2, uncontrolled ([place]) 05/11/2015  . Current smoker 05/11/2015  . Hemorrhoids, internal 05/11/2015  . Arthralgia of hip 05/11/2015  . Adiposity 05/11/2015  . 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  . Restless leg  05/11/2015  . Trochanteric bursitis of right hip 06/17/2013  . Sciatica 03/19/2012  . Lumbar degenerative disc disease 03/19/2012    Social History   Tobacco Use  . Smoking status: Current Some Day Smoker    Packs/day: 0.50    Types: Cigarettes  . Smokeless tobacco: Current User  Substance Use Topics  . Alcohol use: Yes    Alcohol/week: 0.0 standard drinks     Current Outpatient Medications:  .  albuterol (PROVENTIL HFA;VENTOLIN HFA) 108 (90 Base) MCG/ACT inhaler, Inhale 2-4 puffs by mouth every 4 hours as needed for wheezing, cough, and/or shortness of breath, Disp: 1 Inhaler, Rfl: 1 .  aspirin 81 MG EC tablet, Take 81 mg by mouth daily. Swallow whole., Disp: , Rfl:  .  atorvastatin (LIPITOR) 20 MG tablet, Take 1 tablet (20 mg total) by mouth at bedtime., Disp: 90 tablet, Rfl: 1 .  Cinnamon 500 MG capsule, Take 1,000 mg by mouth daily., Disp: , Rfl:  .  Garlic 10 MG CAPS, Take by mouth., Disp: , Rfl:  .  metFORMIN (GLUCOPHAGE-XR) 500 MG 24 hr tablet, One by mouth daily; appointment needed, Disp: 180 tablet, Rfl: 0 .  Omega-3 Fatty Acids (FISH OIL) 1000 MG CAPS, Take by mouth., Disp: , Rfl:  .  ZINC-MAGNESIUM ASPART-VIT B6 PO, Take by mouth., Disp: , Rfl:  .  acetaminophen (TYLENOL) 500 MG tablet, Take 2 tablets (1,000 mg total)  by mouth every 8 (eight) hours as needed for fever. (Patient not taking: Reported on 06/13/2019), Disp: 30 tablet, Rfl: 0 .  Blood Glucose Monitoring Suppl (BAYER CONTOUR NEXT MONITOR) w/Device KIT, 1 Device by Does not apply route 1 day or 1 dose. DX: DM E11.5, Disp: 1 kit, Rfl: 0 .  cyclobenzaprine (FLEXERIL) 10 MG tablet, Take 1 tablet (10 mg total) by mouth 3 (three) times daily as needed for muscle spasms. (Patient not taking: Reported on 06/13/2019), Disp: 30 tablet, Rfl: 0 .  Echinacea 400 MG CAPS, Take 2 capsules by mouth., Disp: , Rfl:  .  indomethacin (INDOCIN) 25 MG capsule, Take 1 capsule (25 mg total) by mouth 3 (three) times daily with meals.  (Patient not taking: Reported on 03/13/2019), Disp: 30 capsule, Rfl: 0 .  OVER THE COUNTER MEDICATION, Tonic water, Disp: , Rfl:  .  promethazine-dextromethorphan (PROMETHAZINE-DM) 6.25-15 MG/5ML syrup, Take 2.5 mLs by mouth 4 (four) times daily as needed. (Patient not taking: Reported on 06/13/2019), Disp: 118 mL, Rfl: 0 .  QUERCETIN PO, Take by mouth., Disp: , Rfl:  .  sulfamethoxazole-trimethoprim (BACTRIM DS) 800-160 MG tablet, Take 1 tablet by mouth 2 (two) times daily. (Patient not taking: Reported on 06/13/2019), Disp: 14 tablet, Rfl: 0  Allergies  Allergen Reactions  . Topiramate Other (See Comments)    White light in peripheral vision  . Trazodone Other (See Comments)    Makes tingling & numbness in hands & arms worse; causes an erection.  . Baclofen Itching  . Diclofenac Sodium Other (See Comments)    Raised blood sugar     I personally reviewed active problem list, medication list, allergies, notes from last encounter, lab results with the patient/caregiver today.  ROS  Ten systems reviewed and is negative except as mentioned in HPI  Objective  Vitals:   06/13/19 0915  Pulse: 89  Resp: 14  Temp: (!) 97.3 F (36.3 C)  SpO2: 93%  Weight: 243 lb 6.4 oz (110.4 kg)  Height: 5' 9"  (1.753 m)    Body mass index is 35.94 kg/m.  Nursing Note and Vital Signs reviewed.  Physical Exam  Constitutional: Patient appears well-developed and well-nourished. No distress.  HENT: Head: Normocephalic and atraumatic. Eyes: Conjunctivae and EOM are normal. No scleral icterus. Neck: Normal range of motion. Neck supple. No JVD present. No thyromegaly present.  Cardiovascular: Normal rate, regular rhythm and normal heart sounds.  No murmur heard. No BLE edema. Pulmonary/Chest: Effort normal and breath sounds with inspiratory wheezes throughout and slight rhonchi in the RLL. No respiratory distress. Musculoskeletal: Normal range of motion, no joint effusions. No gross deformities  Neurological: Pt is alert and oriented to person, place, and time. No cranial nerve deficit. Coordination, balance, strength, speech and gait are normal.  Skin: Skin is warm and dry. No rash noted. No erythema.  Psychiatric: Patient has a normal mood and affect. behavior is normal. Judgment and thought content normal.  No results found for this or any previous visit (from the past 72 hour(s)).  Assessment & Plan  1. Chest pain, unspecified type - EKG 12-Lead - when compared to February 25 2019 EKG, EKG is generally unchanged and shows NSR.  - CBC with Differential/Platelet; Future - Comprehensive metabolic panel; Future - Lipid panel; Future - atorvastatin (LIPITOR) 20 MG tablet; Take 1 tablet (20 mg total) by mouth at bedtime.  Dispense: 90 tablet; Refill: 1 - DG Chest 2 View; Future - Troponin I (High Sensitivity); Future - STAT labs and  CXR today as he refuses ER at this time.  We will refer to cardiology, if cleared from a cardiology standpoint, will refer to pulmonology.  If abnormality on labs or CXR requiring ER care today, he is agreeable to go. - Did explain that he has several high risk factors for MI and he still refuses ER at this time.   2. Uncontrolled type 2 diabetes mellitus with hyperglycemia (HCC) - atorvastatin (LIPITOR) 20 MG tablet; Take 1 tablet (20 mg total) by mouth at bedtime.  Dispense: 90 tablet; Refill: 1  3. Class 1 obesity due to excess calories without serious comorbidity with body mass index (BMI) of 33.0 to 33.9 in adult - atorvastatin (LIPITOR) 20 MG tablet; Take 1 tablet (20 mg total) by mouth at bedtime.  Dispense: 90 tablet; Refill: 1  4. Hyperlipidemia LDL goal <100 - atorvastatin (LIPITOR) 20 MG tablet; Take 1 tablet (20 mg total) by mouth at bedtime.  Dispense: 90 tablet; Refill: 1  5. History of 2019 novel coronavirus disease (COVID-19) - CBC with Differential/Platelet; Future - DG Chest 2 View; Future  6. Esophageal dysphagia - Ambulatory  referral to Gastroenterology  -Red flags and when to present for emergency care or RTC including fever >101.24F, chest pain, shortness of breath, new/worsening/un-resolving symptoms, reviewed with patient at time of visit. Follow up and care instructions discussed and provided in AVS.

## 2019-06-19 ENCOUNTER — Encounter: Payer: Self-pay | Admitting: Family Medicine

## 2019-06-20 IMAGING — CR CHEST - 2 VIEW
2 series · 2 of 2 positions shown · non-contrast
Comparison: none

CLINICAL DATA: pop and stabbing pain in his right side a couple
weeks ago and he was brought to the ER and had a CT and they found a
cyst in his right lung. Pt states he felt the same pop as before a
couple nights ago and thinks his cyst popped. Pt currently has pain
in his right side

EXAM:
CHEST - 2 VIEW

[chest pa]
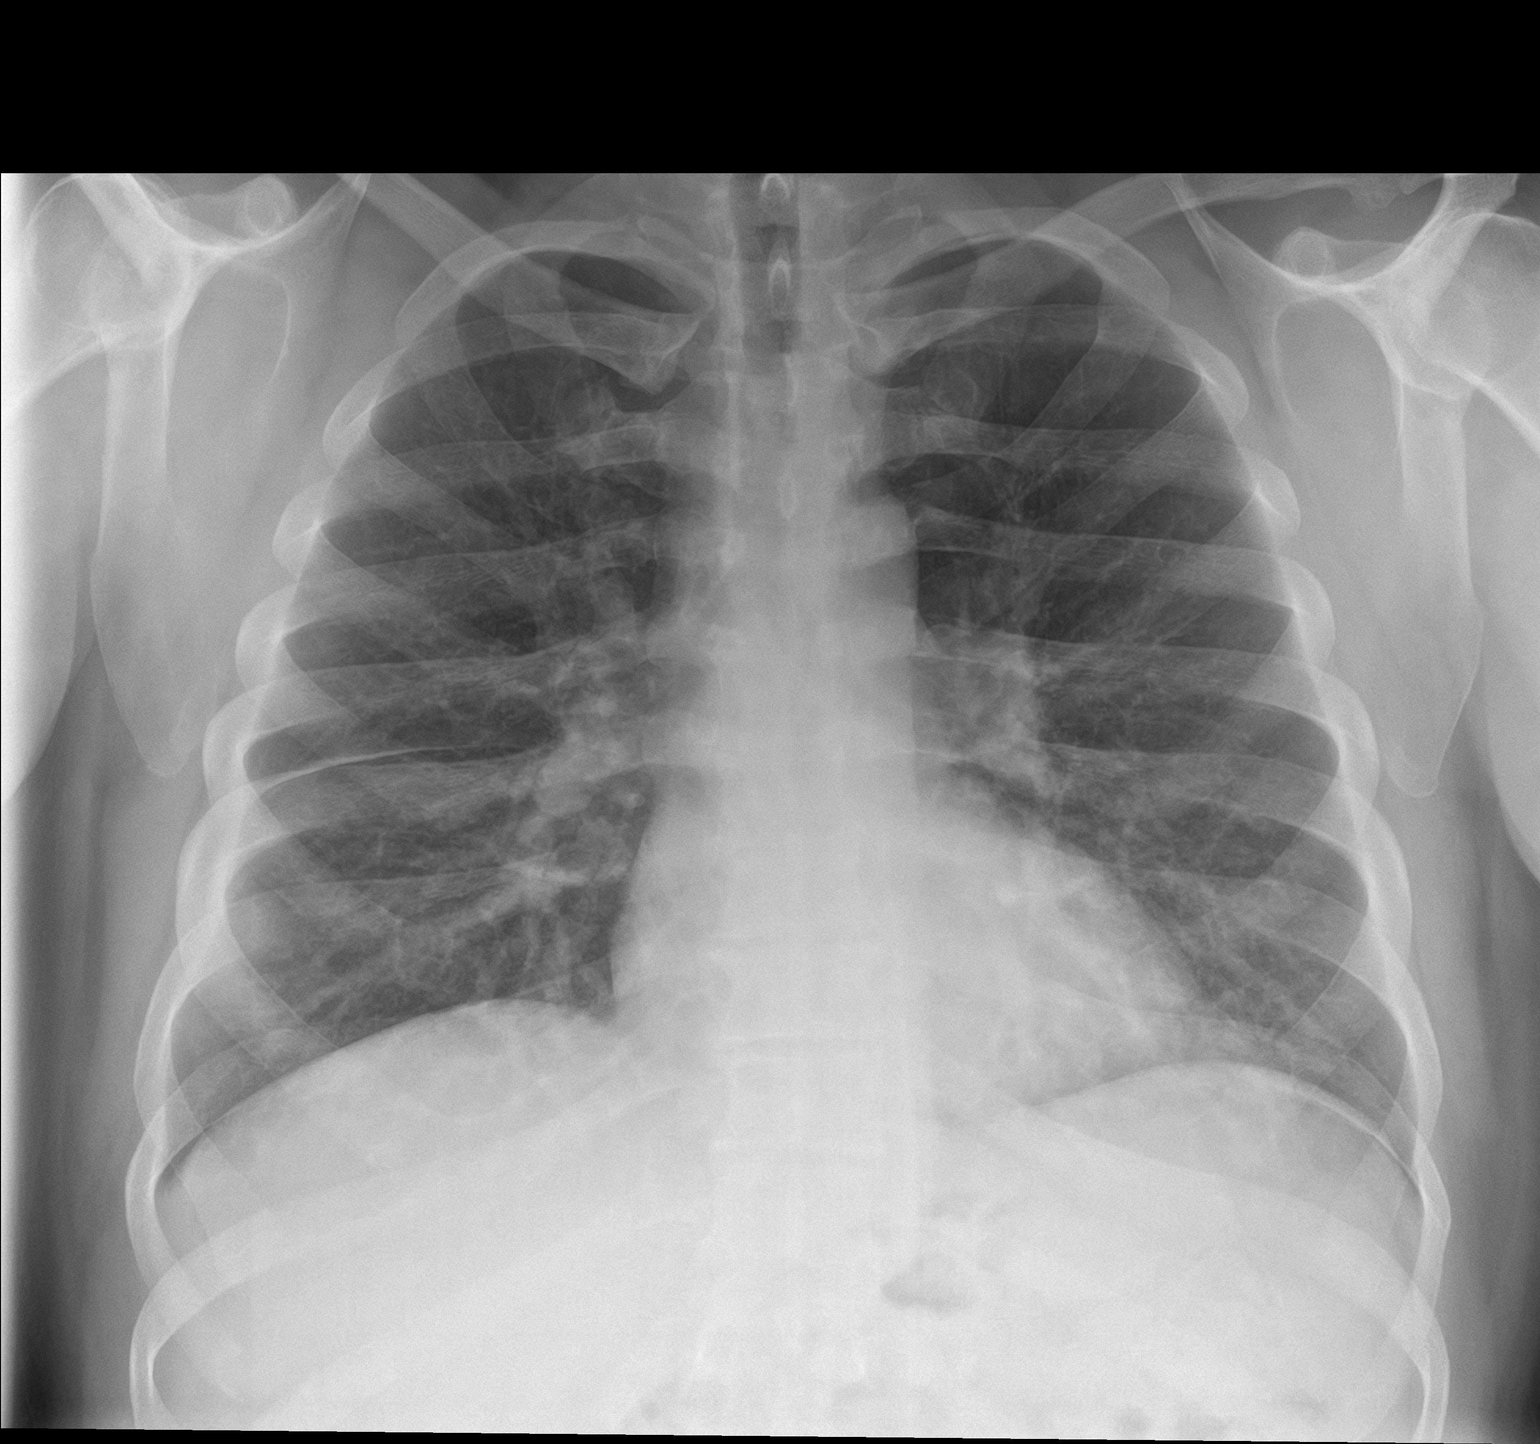

[chest lat]
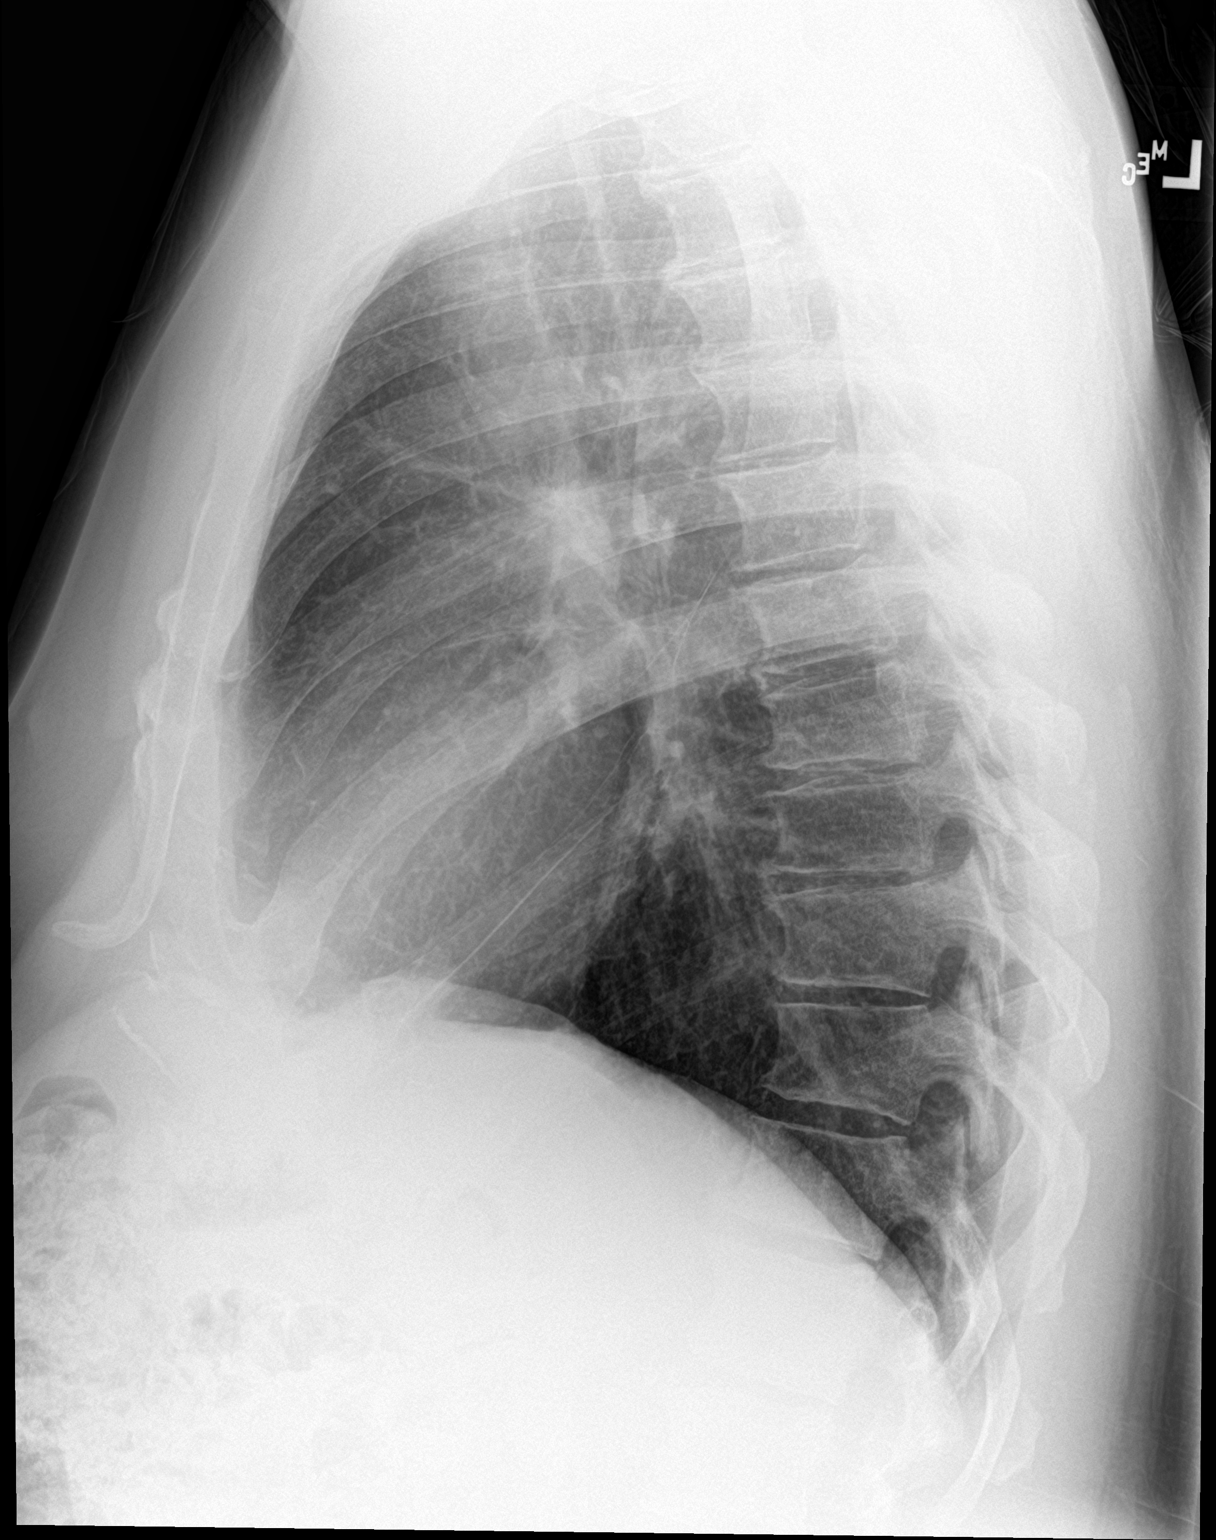

[2 of 2 positions shown; findings below may reference images not displayed]

FINDINGS: Lungs are clear.

Heart size and mediastinal contours are within normal limits.

No effusion.  No pneumothorax.

Visualized bones unremarkable.
IMPRESSION: No acute cardiopulmonary disease.

## 2019-06-26 ENCOUNTER — Other Ambulatory Visit: Payer: Self-pay | Admitting: Family Medicine

## 2019-06-26 DIAGNOSIS — E1165 Type 2 diabetes mellitus with hyperglycemia: Secondary | ICD-10-CM

## 2019-06-27 MED ORDER — METFORMIN HCL ER 500 MG PO TB24: ORAL_TABLET | ORAL | 0 refills | Status: DC

## 2019-06-27 NOTE — Addendum Note (Signed)
Addended by: Chilton Greathouse on: 06/27/2019 03:35 PM   Modules accepted: Orders

## 2019-06-27 NOTE — Telephone Encounter (Signed)
Patient needs appointment in order to obtain further refills.  

## 2019-06-27 NOTE — Telephone Encounter (Signed)
Sent encounter for 4 tablets to last him until Monday. Please sign.

## 2019-06-27 NOTE — Telephone Encounter (Signed)
Pt scheduled appt with Raquel Sarna for Monday but does not understand why it cannot be filled if he has already done blood work.

## 2019-06-30 ENCOUNTER — Encounter: Payer: Self-pay | Admitting: Family Medicine

## 2019-06-30 ENCOUNTER — Ambulatory Visit (INDEPENDENT_AMBULATORY_CARE_PROVIDER_SITE_OTHER): Payer: PRIVATE HEALTH INSURANCE | Admitting: Family Medicine

## 2019-06-30 ENCOUNTER — Other Ambulatory Visit: Payer: Self-pay

## 2019-06-30 DIAGNOSIS — E1165 Type 2 diabetes mellitus with hyperglycemia: Secondary | ICD-10-CM

## 2019-06-30 DIAGNOSIS — E785 Hyperlipidemia, unspecified: Secondary | ICD-10-CM

## 2019-06-30 DIAGNOSIS — R1319 Other dysphagia: Secondary | ICD-10-CM | POA: Insufficient documentation

## 2019-06-30 DIAGNOSIS — S2241XA Multiple fractures of ribs, right side, initial encounter for closed fracture: Secondary | ICD-10-CM | POA: Insufficient documentation

## 2019-06-30 DIAGNOSIS — Z6833 Body mass index (BMI) 33.0-33.9, adult: Secondary | ICD-10-CM

## 2019-06-30 DIAGNOSIS — R1310 Dysphagia, unspecified: Secondary | ICD-10-CM | POA: Diagnosis not present

## 2019-06-30 DIAGNOSIS — J449 Chronic obstructive pulmonary disease, unspecified: Secondary | ICD-10-CM

## 2019-06-30 DIAGNOSIS — E6609 Other obesity due to excess calories: Secondary | ICD-10-CM

## 2019-06-30 DIAGNOSIS — S2241XD Multiple fractures of ribs, right side, subsequent encounter for fracture with routine healing: Secondary | ICD-10-CM | POA: Diagnosis not present

## 2019-06-30 MED ORDER — METFORMIN HCL ER 500 MG PO TB24: ORAL_TABLET | ORAL | 0 refills | Status: DC

## 2019-06-30 NOTE — Telephone Encounter (Signed)
Saw patient and provided 90-day refill.

## 2019-06-30 NOTE — Progress Notes (Signed)
Name: Joel Page   MRN: [id number]    DOB: Oct 09, 1971   Date:06/30/2019       Progress Note  Subjective  Chief Complaint  Chief Complaint  Patient presents with  . Diabetes  . Medication Refill  . Referral    trouble swallowing    I connected with  Joel Page  on 06/30/19 at  7:40 AM EDT by a video enabled telemedicine application and verified that I am speaking with the correct person using two identifiers.  I discussed the limitations of evaluation and management by telemedicine and the availability of in person appointments. The patient expressed understanding and agreed to proceed. Staff also discussed with the patient that there may be a patient responsible charge related to this service. Patient Location: Home Provider Location: Office Additional Individuals present: None  HPI  RIGHT Sided ribcage pain s/p fracture: He had X-ray 06/13/2019 that showed healing subacute chronic right anterior 6th and 7th rib fractures.  He notes pain has been slowly improving, having pain when coughing or with certain movements.  Dysphagia - Has been intermittent for years; notes family history of the same.  He notes his father aspirated and died from lung infection secondary to this. Occasional choking episodes, but has never needed heimlich maneuver. He has an appointment 07/29/2019 with Joel Page.  Discussed choking precautions - have a drink nearby to ensure proper moisture of his food, cutting meats into small pieces.  DM/Obesity: BG's have been between 90-130.  Highest in the last 2 weeks is 200.  Denies polydipsia, polyphagia, or polyuria. Due for urine micro. He does still eat some carbohydrates and sweets, but limits portions; most of his diet is vegetables, fruits, and meats. Taking metformin, statin.  Not on ACE or ARB. Has never attended DM classes.  HLD: Denies chest pain (aside from right ribcage pain), shortness of breath, or myalgias.  Taking statin and fish oil.  Last check was not quite to goal - needs to work on diet.  Leukocytosis: He has pattern of mildly elevated WBC's.  We will recheck at next visit in 3 months.   COPD & AR: Doing okay, has occasional allergies with nasal congestion and cough.  No shortness of breath. Using flonase.  No inhalers at this time.  Recommend smoking cessation - not ready to quit.  Patient Active Problem List   Diagnosis Date Noted  . Hyperlipidemia LDL goal <100 07/04/2016  . Obesity 06/29/2016  . Back pain at L4-L5 level 04/07/2016  . Cough 03/30/2016  . Medication monitoring encounter 03/30/2016  . COPD suggested by initial evaluation ([place]) 12/22/2015  . 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  . Diabetes mellitus type 2, uncontrolled ([place]) 05/11/2015  . Current smoker 05/11/2015  . Hemorrhoids, internal 05/11/2015  . Arthralgia of hip 05/11/2015  . Adiposity 05/11/2015  . 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  . Restless leg 05/11/2015  . Trochanteric bursitis of right hip 06/17/2013  . Sciatica 03/19/2012  . Lumbar degenerative disc disease 03/19/2012    Past Surgical History:  Procedure Laterality Date  . EXCISIONAL HEMORRHOIDECTOMY  2012  . ORIF FEMUR FRACTURE  1989  . SPHINCTEROTOMY    . TOOTH EXTRACTION      Family History  Problem Relation Age of Onset  . Lung cancer Mother   . Diabetes Mother   . Dementia Father   . COPD Father   . Prostate cancer Father   . Diabetes Brother   . Cancer  Maternal Grandfather     Social History   Socioeconomic History  . Marital status: Married    Spouse name: Joel Page  . Number of children: 3  . Years of education: Not on file  . Highest education level: Some college, no degree  Occupational History    Employer: [employer]  Social Needs  . Financial resource strain: Not very hard  . Food insecurity    Worry: Never true    Inability: Never true  . Transportation needs    Medical: No     Non-medical: No  Tobacco Use  . Smoking status: Current Some Day Smoker    Packs/day: 0.50    Types: Cigarettes  . Smokeless tobacco: Current User  Substance and Sexual Activity  . Alcohol use: Yes    Alcohol/week: 0.0 standard drinks  . Drug use: Never  . Sexual activity: Yes    Partners: Female  Lifestyle  . Physical activity    Days per week: 0 days    Minutes per session: 0 min  . Stress: Very much  Relationships  . Social Herbalist on phone: Three times a week    Gets together: Twice a week    Attends religious service: 1 to 4 times per year    Active member of club or organization: No    Attends meetings of clubs or organizations: Never    Relationship status: Married  . Intimate partner violence    Fear of current or ex partner: No    Emotionally abused: No    Physically abused: No    Forced sexual activity: No  Other Topics Concern  . Not on file  Social History Narrative  . Not on file     Current Outpatient Medications:  .  albuterol (PROVENTIL HFA;VENTOLIN HFA) 108 (90 Base) MCG/ACT inhaler, Inhale 2-4 puffs by mouth every 4 hours as needed for wheezing, cough, and/or shortness of breath, Disp: 1 Inhaler, Rfl: 1 .  aspirin 81 MG EC tablet, Take 81 mg by mouth daily. Swallow whole., Disp: , Rfl:  .  atorvastatin (LIPITOR) 20 MG tablet, Take 1 tablet (20 mg total) by mouth at bedtime., Disp: 90 tablet, Rfl: 1 .  Blood Glucose Monitoring Suppl (BAYER CONTOUR NEXT MONITOR) w/Device KIT, 1 Device by Does not apply route 1 day or 1 dose. DX: DM E11.5, Disp: 1 kit, Rfl: 0 .  Cinnamon 500 MG capsule, Take 1,000 mg by mouth daily., Disp: , Rfl:  .  Echinacea 400 MG CAPS, Take 2 capsules by mouth., Disp: , Rfl:  .  Garlic 10 MG CAPS, Take by mouth., Disp: , Rfl:  .  metFORMIN (GLUCOPHAGE-XR) 500 MG 24 hr tablet, One by mouth daily; appointment needed, Disp: 4 tablet, Rfl: 0 .  Omega-3 Fatty Acids (FISH OIL) 1000 MG CAPS, Take by mouth., Disp: , Rfl:  .  OVER  THE COUNTER MEDICATION, Tonic water, Disp: , Rfl:  .  QUERCETIN PO, Take by mouth., Disp: , Rfl:  .  ZINC-MAGNESIUM ASPART-VIT B6 PO, Take by mouth., Disp: , Rfl:   Allergies  Allergen Reactions  . Topiramate Other (See Comments)    White light in peripheral vision  . Trazodone Other (See Comments)    Makes tingling & numbness in hands & arms worse; causes an erection.  . Baclofen Itching  . Diclofenac Sodium Other (See Comments)    Raised blood sugar     I personally reviewed active problem list, medication list, allergies,  notes from last encounter, lab results with the patient/caregiver today.   ROS  Ten systems reviewed and is negative except as mentioned in HPI  Objective  Virtual encounter, vitals not obtained.  There is no height or weight on file to calculate BMI.  Physical Exam Constitutional: Patient appears well-developed and well-nourished. No distress.  HENT: Head: Normocephalic and atraumatic.  Neck: Normal range of motion. Pulmonary/Chest: Effort normal. No respiratory distress. Speaking in complete sentences Neurological: Pt is alert and oriented to person, place, and time. Coordination, speech are normal.  Psychiatric: Patient has a normal mood and affect. behavior is normal. Judgment and thought content normal.  No results found for this or any previous visit (from the past 72 hour(s)).  PHQ2/9: Depression screen [facility] 2/9 06/30/2019 06/13/2019 03/13/2019 03/03/2019 02/26/2019  Decreased Interest 0 0 0 0 0  Down, Depressed, Hopeless 0 0 0 0 0  PHQ - 2 Score 0 0 0 0 0  Altered sleeping 0 0 0 0 0  Tired, decreased energy 0 0 0 0 0  Change in appetite 0 0 0 0 0  Feeling bad or failure about yourself  0 0 0 0 0  Trouble concentrating 0 0 0 0 0  Moving slowly or fidgety/restless 0 0 0 0 0  Suicidal thoughts 0 0 0 - 0  PHQ-9 Score 0 0 0 0 0  Difficult doing work/chores Not difficult at all Not difficult at all Not difficult at all Not difficult at all Not  difficult at all  Some recent data might be hidden   PHQ-2/9 Result is negative.    Fall Risk: Fall Risk  06/30/2019 06/13/2019 03/13/2019 03/03/2019 02/26/2019  Falls in the past year? 0 0 0 0 0  Number falls in past yr: 0 0 0 0 0  Injury with Fall? 0 0 0 0 0  Risk for fall due to : - - - - -  Follow up Falls evaluation completed - - Falls evaluation completed Falls evaluation completed    Assessment & Plan  1. Closed fracture of multiple ribs of right side with routine healing, subsequent encounter - Improving  2. Esophageal dysphagia - Has appt with GI 07/29/2019  3. Uncontrolled type 2 diabetes mellitus with hyperglycemia ([place]) - Ambulatory referral to diabetic education - metFORMIN (GLUCOPHAGE-XR) 500 MG 24 hr tablet; One by mouth daily; appointment needed  Dispense: 4 tablet; Refill: 0 - Microalbumin / creatinine urine ratio - Hemoglobin W6F - BASIC METABOLIC PANEL WITH GFR  4. Hyperlipidemia LDL goal <100 - Continue statin and fish oil; lifestyle changes reinforced  5. Class 1 obesity due to excess calories without serious comorbidity with body mass index (BMI) of 33.0 to 33.9 in adult - Discussed importance of 150 minutes of physical activity weekly, eat two servings of fish weekly, eat one serving of tree nuts ( cashews, pistachios, pecans, almonds.Marland Kitchen) every other day, eat 6 servings of fruit/vegetables daily and drink plenty of water and avoid sweet beverages.  6. COPD suggested by initial evaluation ([place]) - Flonase for AR; declines inhaler today.  I discussed the assessment and treatment plan with the patient. The patient was provided an opportunity to ask questions and all were answered. The patient agreed with the plan and demonstrated an understanding of the instructions.  The patient was advised to call back or seek an in-person evaluation if the symptoms worsen or if the condition fails to improve as anticipated.  I provided 22 minutes of non-face-to-face time during  this  encounter.

## 2019-07-11 ENCOUNTER — Encounter: Payer: Self-pay | Admitting: Family Medicine

## 2019-07-17 ENCOUNTER — Other Ambulatory Visit: Payer: Self-pay

## 2019-07-17 ENCOUNTER — Encounter: Payer: Self-pay | Admitting: Emergency Medicine

## 2019-07-17 ENCOUNTER — Emergency Department
Admission: EM | Admit: 2019-07-17 | Discharge: 2019-07-17 | Disposition: A | Discharge status: Home / Self Care | Payer: No Typology Code available for payment source | Attending: Student in an Organized Health Care Education/Training Program | Admitting: Student in an Organized Health Care Education/Training Program

## 2019-07-17 ENCOUNTER — Emergency Department: Payer: No Typology Code available for payment source

## 2019-07-17 DIAGNOSIS — F17210 Nicotine dependence, cigarettes, uncomplicated: Secondary | ICD-10-CM | POA: Diagnosis not present

## 2019-07-17 DIAGNOSIS — R0789 Other chest pain: Secondary | ICD-10-CM | POA: Diagnosis present

## 2019-07-17 DIAGNOSIS — J449 Chronic obstructive pulmonary disease, unspecified: Secondary | ICD-10-CM | POA: Diagnosis not present

## 2019-07-17 DIAGNOSIS — Z79899 Other long term (current) drug therapy: Secondary | ICD-10-CM | POA: Diagnosis not present

## 2019-07-17 DIAGNOSIS — Z7982 Long term (current) use of aspirin: Secondary | ICD-10-CM | POA: Diagnosis not present

## 2019-07-17 DIAGNOSIS — E119 Type 2 diabetes mellitus without complications: Secondary | ICD-10-CM | POA: Insufficient documentation

## 2019-07-17 DIAGNOSIS — M940 Chondrocostal junction syndrome [Tietze]: Secondary | ICD-10-CM | POA: Diagnosis not present

## 2019-07-17 DIAGNOSIS — I2510 Atherosclerotic heart disease of native coronary artery without angina pectoris: Secondary | ICD-10-CM | POA: Diagnosis not present

## 2019-07-17 MED ORDER — OXYCODONE-ACETAMINOPHEN 7.5-325 MG PO TABS: 1.0000 | ORAL_TABLET | Freq: Four times a day (QID) | ORAL | 0 refills | Status: DC | PRN

## 2019-07-17 MED ORDER — KETOROLAC TROMETHAMINE 60 MG/2ML IM SOLN
60.0000 mg | Freq: Once | INTRAMUSCULAR | Status: DC
  Filled 2019-07-17: qty 2

## 2019-07-17 NOTE — ED Triage Notes (Signed)
Says this am at work he coughed and felt a pop in right lower rib area.  Says it was already fractured in April. (he had covid at that time)   Has had pain since then.

## 2019-07-17 NOTE — ED Notes (Signed)
Reports he coughed and felt something pop on right side. Patient reports he thinks he fx his rib due to coughing, having bad pain and hurts to breath.

## 2019-07-17 NOTE — ED Notes (Signed)
Returned from xray

## 2019-07-17 NOTE — Discharge Instructions (Signed)
Follow discharge care instruction and advised medication may cause drowsiness.

## 2019-07-17 NOTE — ED Notes (Signed)
Pt c/o "pop" noise around same area (R mid) as recent rib fracture after coughing. States hurts worse when taking a deep breath. Denies inc SOB. Sitting calmly in chair; unlabored/regular breaths.

## 2019-07-17 NOTE — ED Provider Notes (Signed)
[facility] Emergency Department Provider Note   ____________________________________________   First MD Initiated Contact with Patient 07/17/19 1130     (approximate)  I have reviewed the triage vital signs and the nursing notes.   HISTORY  Chief Complaint Cough and Rib Injury    HPI Joel Page is a 48 y.o. male patient complain of right lateral rib pain secondary to coughing spell this morning.  Patient has a recent history of right rib fracture 3 months ago.  Patient rates her current pain a 7/10.  Patient described the pain is "achy".  Patient state pain increased with deep inspirations.         Past Medical History:  Diagnosis Date  . Allergy   . Coronary artery disease   . DDD (degenerative disc disease)   . Diabetes mellitus   . Insomnia   . Lumbar stenosis   . 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 Multiple closed fractures of ribs of right side 06/30/2019  . Esophageal dysphagia 06/30/2019  . Cervical stenosis of spinal canal 08/15/2016  . Hyperlipidemia LDL goal <100 07/04/2016  . Obesity 06/29/2016  . Back pain at L4-L5 level 04/07/2016  . Cough 03/30/2016  . Medication monitoring encounter 03/30/2016  . COPD suggested by initial evaluation ([place]) 12/22/2015  . 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  . Diabetes mellitus type 2, uncontrolled ([place]) 05/11/2015  . Current smoker 05/11/2015  . Hemorrhoids, internal 05/11/2015  . Arthralgia of hip 05/11/2015  . 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  . Restless leg 05/11/2015  . Trochanteric bursitis of right hip 06/17/2013  . Sciatica 03/19/2012  . Lumbar degenerative disc disease 03/19/2012    Past Surgical History:  Procedure Laterality Date  . EXCISIONAL HEMORRHOIDECTOMY  2012  . ORIF FEMUR FRACTURE  1989  . SPHINCTEROTOMY    . TOOTH EXTRACTION       Prior to Admission medications   Medication Sig Start Date End Date Taking? Authorizing Provider  albuterol (PROVENTIL HFA;VENTOLIN HFA) 108 (90 Base) MCG/ACT inhaler Inhale 2-4 puffs by mouth every 4 hours as needed for wheezing, cough, and/or shortness of breath 12/05/18   Hubbard Hartshorn, FNP  aspirin 81 MG EC tablet Take 81 mg by mouth daily. Swallow whole.    [provider]  atorvastatin (LIPITOR) 20 MG tablet Take 1 tablet (20 mg total) by mouth at bedtime. 06/13/19   Hubbard Hartshorn, FNP  Blood Glucose Monitoring Suppl (BAYER CONTOUR NEXT MONITOR) w/Device KIT 1 Device by Does not apply route 1 day or 1 dose. DX: DM E11.5 12/22/15   Bobetta Lime, MD  Cinnamon 500 MG capsule Take 1,000 mg by mouth daily.    [provider]  Echinacea 400 MG CAPS Take 2 capsules by mouth.    [provider]  Garlic 10 MG CAPS Take by mouth.    [provider]  metFORMIN (GLUCOPHAGE-XR) 500 MG 24 hr tablet One by mouth daily; appointment needed 06/30/19   Hubbard Hartshorn, FNP  Omega-3 Fatty Acids (FISH OIL) 1000 MG CAPS Take by mouth.    [provider]  OVER THE COUNTER MEDICATION Tonic water    [provider]  oxyCODONE-acetaminophen (PERCOCET) 7.5-325 MG tablet Take 1 tablet by mouth every 6 (six) hours as needed. 07/17/19   Sable Feil, PA-C  QUERCETIN PO Take by mouth.  [provider]  ZINC-MAGNESIUM ASPART-VIT B6 PO Take by mouth.    [provider]    Allergies Topiramate, Trazodone, Baclofen, and Diclofenac sodium  Family History  Problem Relation Age of Onset  . Lung cancer Mother   . Diabetes Mother   . Dementia Father   . COPD Father   . Prostate cancer Father   . Diabetes Brother   . Cancer Maternal Grandfather     Social History Social History   Tobacco Use  . Smoking status: Current Some Day Smoker    Packs/day: 0.50    Types: Cigarettes  . Smokeless tobacco: Current User  Substance Use Topics   . Alcohol use: Yes    Alcohol/week: 0.0 standard drinks  . Drug use: Never    Review of Systems  Constitutional: No fever/chills Eyes: No visual changes. ENT: No sore throat. Cardiovascular: Denies chest pain. Respiratory: Denies shortness of breath. Gastrointestinal: No abdominal pain.  No nausea, no vomiting.  No diarrhea.  No constipation. Genitourinary: Negative for dysuria. Musculoskeletal: Right rib pain. Skin: Negative for rash. Neurological: Negative for headaches, focal weakness or numbness.   ____________________________________________   PHYSICAL EXAM:  VITAL SIGNS: ED Triage Vitals  Enc Vitals Group     BP 07/17/19 1046 115/61     Pulse Rate 07/17/19 1046 89     Resp 07/17/19 1046 17     Temp 07/17/19 1046 98.9 F (37.2 C)     Temp Source 07/17/19 1046 Oral     SpO2 07/17/19 1046 95 %     Weight 07/17/19 1049 243 lb (110.2 kg)     Height 07/17/19 1049 5' 9"  (1.753 m)     Head Circumference --      Peak Flow --      Pain Score 07/17/19 1049 7     Pain Loc --      Pain Edu? --      Excl. in [place]? --    Constitutional: Alert and oriented. Well appearing and in no acute distress. Neck: No stridor.  No cervical spine tenderness to palpation Hematological/Lymphatic/Immunilogical: No cervical lymphadenopathy. Cardiovascular: Normal rate, regular rhythm. Grossly normal heart sounds.  Good peripheral circulation. Respiratory: Normal respiratory effort.  No retractions. Lungs CTAB. Musculoskeletal: No lower extremity tenderness nor edema.  No joint effusions. Neurologic:  Normal speech and language. No gross focal neurologic deficits are appreciated. No gait instability. Skin:  Skin is warm, dry and intact. No rash noted. Psychiatric: Mood and affect are normal. Speech and behavior are normal.  ____________________________________________   LABS (all labs ordered are listed, but only abnormal results are displayed)  Labs Reviewed - No data to display  ____________________________________________  EKG   ____________________________________________  RADIOLOGY  ED MD interpretation:    Official radiology report(s): Dg Ribs Unilateral W/chest Right  Result Date: 07/17/2019 CLINICAL DATA:  Right lateral chest wall pain. EXAM: RIGHT RIBS AND CHEST - 3+ VIEW COMPARISON:  06/13/2019. FINDINGS: Mediastinum and hilar structures normal. Heart size normal. No focal infiltrate. No pleural effusion or pneumothorax. Old right anterior sixth and seventh rib fractures are noted. IMPRESSION: No acute cardiopulmonary disease. Electronically Signed   By: Marcello Moores  Register   On: 07/17/2019 12:15    ____________________________________________   PROCEDURES  Procedure(s) performed (including Critical Care):  Procedures   ____________________________________________   INITIAL IMPRESSION / ASSESSMENT AND PLAN / ED COURSE  As part of my medical decision making, I reviewed the following data within the [place]  Keeshawn Fakhouri was evaluated in Emergency Department on 07/17/2019 for the symptoms described in the history of present illness. He was evaluated in the context of the global COVID-19 pandemic, which necessitated consideration that the patient might be at risk for infection with the SARS-CoV-2 virus that causes COVID-19. Institutional protocols and algorithms that pertain to the evaluation of patients at risk for COVID-19 are in a state of rapid change based on information released by regulatory bodies including the CDC and federal and state organizations. These policies and algorithms were followed during the patient's care in the ED.  Patient presents with right lateral rib pain secondary to a coughing spell.  Patient has a history of recent rib fractures.  Discussed CT findings with patient.  Patient given discharge care instruction work note.  Patient advised follow-up PCP.       ____________________________________________   FINAL CLINICAL IMPRESSION(S) / ED DIAGNOSES  Final diagnoses:  Costochondritis     ED Discharge Orders         Ordered    oxyCODONE-acetaminophen (PERCOCET) 7.5-325 MG tablet  Every 6 hours PRN     07/17/19 1251           Note:  This document was prepared using Dragon voice recognition software and may include unintentional dictation errors.    Sable Feil, PA-C 07/17/19 1253    Merlyn Lot, MD 07/17/19 [phone]

## 2019-07-17 NOTE — ED Notes (Signed)
Provider at bedside updating pt.

## 2019-07-18 ENCOUNTER — Encounter: Payer: Self-pay | Admitting: Family Medicine

## 2019-07-18 ENCOUNTER — Other Ambulatory Visit: Payer: Self-pay | Admitting: Family Medicine

## 2019-07-18 DIAGNOSIS — E1165 Type 2 diabetes mellitus with hyperglycemia: Secondary | ICD-10-CM

## 2019-07-18 LAB — HEMOGLOBIN A1C
Hgb A1c MFr Bld: 7.9 % of total Hgb — ABNORMAL HIGH (ref <5.7)
Mean Plasma Glucose: 180 (calc)
eAG (mmol/L): 10 (calc)

## 2019-07-18 LAB — BASIC METABOLIC PANEL WITH GFR
BUN: 12 mg/dL (ref 7–25)
CO2: 28 mmol/L (ref 20–32)
Calcium: 9.1 mg/dL (ref 8.6–10.3)
Chloride: 102 mmol/L (ref 98–110)
Creat: 0.92 mg/dL (ref 0.60–1.35)
GFR, Est African American: 113 mL/min/{1.73_m2} (ref >=60)
GFR, Est Non African American: 97 mL/min/{1.73_m2} (ref >=60)
Glucose, Bld: 247 mg/dL — ABNORMAL HIGH (ref 65–99)
Potassium: 4.4 mmol/L (ref 3.5–5.3)
Sodium: 138 mmol/L (ref 135–146)

## 2019-07-18 LAB — MICROALBUMIN / CREATININE URINE RATIO
Creatinine, Urine: 34 mg/dL (ref 20–320)
Microalb Creat Ratio: 6 mcg/mg creat (ref <30)
Microalb, Ur: 0.2 mg/dL

## 2019-07-18 MED ORDER — JARDIANCE 10 MG PO TABS: 10.0000 mg | ORAL_TABLET | Freq: Every day | ORAL | 1 refills | Status: DC

## 2019-07-18 MED ORDER — METFORMIN HCL ER 500 MG PO TB24: ORAL_TABLET | ORAL | 1 refills | Status: DC

## 2019-07-22 ENCOUNTER — Encounter: Payer: Self-pay | Admitting: Family Medicine

## 2019-07-29 ENCOUNTER — Ambulatory Visit: Payer: PRIVATE HEALTH INSURANCE | Admitting: Gastroenterology

## 2019-08-14 ENCOUNTER — Encounter: Payer: Self-pay | Admitting: Cardiology

## 2019-08-14 ENCOUNTER — Ambulatory Visit (INDEPENDENT_AMBULATORY_CARE_PROVIDER_SITE_OTHER): Payer: No Typology Code available for payment source | Admitting: Cardiology

## 2019-08-14 ENCOUNTER — Other Ambulatory Visit: Payer: Self-pay

## 2019-08-14 VITALS — BP 158/90 | HR 90 | Ht 69.0 in | Wt 245.0 lb

## 2019-08-14 DIAGNOSIS — R079 Chest pain, unspecified: Secondary | ICD-10-CM

## 2019-08-14 DIAGNOSIS — F17200 Nicotine dependence, unspecified, uncomplicated: Secondary | ICD-10-CM

## 2019-08-14 DIAGNOSIS — E0800 Diabetes mellitus due to underlying condition with hyperosmolarity without nonketotic hyperglycemic-hyperosmolar coma (NKHHC): Secondary | ICD-10-CM | POA: Diagnosis not present

## 2019-08-14 NOTE — Patient Instructions (Signed)
Medication Instructions:  Your physician recommends that you continue on your current medications as directed. Please refer to the Current Medication list given to you today.  If you need a refill on your cardiac medications before your next appointment, please call your pharmacy.   Lab work: None ordered If you have labs (blood work) drawn today and your tests are completely normal, you will receive your results only by: Marland Kitchen MyChart Message (if you have MyChart) OR . A paper copy in the mail If you have any lab test that is abnormal or we need to change your treatment, we will call you to review the results.  Testing/Procedures: Your physician has requested that you have a lexiscan myoview. For further information please visit [email or web address]. Please follow instruction sheet, as given.    Follow-Up: At [facility], you and your health needs are our priority.  As part of our continuing mission to provide you with exceptional heart care, we have created designated Provider Care Teams.  These Care Teams include your primary Cardiologist (physician) and Advanced Practice Providers (APPs -  Physician Assistants and Nurse Practitioners) who all work together to provide you with the care you need, when you need it. You will need a follow up appointment after the test.  You may see Dr. Garen Lah or one of the following Advanced Practice Providers on your designated Care Team:   Murray Hodgkins, NP Christell Faith, PA-C . Marrianne Mood, PA-C  Any Other Special Instructions Will Be Listed Below (If Applicable).  [place]  Your caregiver has ordered a Stress Test with nuclear imaging. The purpose of this test is to evaluate the blood supply to your heart muscle. This procedure is referred to as a "Non-Invasive Stress Test." This is because other than having an IV started in your vein, nothing is inserted or "invades" your body. Cardiac stress tests are done to find areas of poor blood  flow to the heart by determining the extent of coronary artery disease (CAD). Some patients exercise on a treadmill, which naturally increases the blood flow to your heart, while others who are  unable to walk on a treadmill due to physical limitations have a pharmacologic/chemical stress agent called Lexiscan . This medicine will mimic walking on a treadmill by temporarily increasing your coronary blood flow.   Please note: these test may take anywhere between 2-4 hours to complete  PLEASE REPORT TO [place] AT THE FIRST DESK WILL DIRECT YOU WHERE TO GO  Date of Procedure:_____________________________________  Arrival Time for Procedure:______________________________  Instructions regarding medication:   _X___ : Hold diabetes medication morning of procedure  PLEASE NOTIFY THE OFFICE AT LEAST 24 HOURS IN ADVANCE IF YOU ARE UNABLE TO KEEP YOUR APPOINTMENT.  [phone] AND  PLEASE NOTIFY NUCLEAR MEDICINE AT [facility] AT LEAST 24 HOURS IN ADVANCE IF YOU ARE UNABLE TO KEEP YOUR APPOINTMENT. [phone]  How to prepare for your Myoview test:  1. Do not eat or drink after midnight 2. No caffeine for 24 hours prior to test 3. No smoking 24 hours prior to test. 4. Your medication may be taken with water.  If your doctor stopped a medication because of this test, do not take that medication. 5. Ladies, please do not wear dresses.  Skirts or pants are appropriate. Please wear a short sleeve shirt. 6. No perfume, cologne or lotion. 7. Wear comfortable walking shoes. No heels!

## 2019-08-14 NOTE — Progress Notes (Signed)
Cardiology Office Note:    Date:  08/14/2019   ID:  [name], DOB 1971/03/08, MRN [id number]  PCP:  Arnetha Courser, MD  Cardiologist:  No primary care provider on file.  Electrophysiologist:  None   Referring MD: Hubbard Hartshorn, FNP   Chief Complaint  Patient presents with  . New Patient (Initial Visit)    Hyperlipidemaia. SOB And CP off and on no Swelling. Medications reviewed verbally.     History of Present Illness:    Joel Page is a 48 y.o. male with a hx of hyperlipidemia, current smoker for about 20 years who presents due to chest pain.  Patient states symptoms have been going on since February of this year, about 7 months ago, after contracting COVID-19.  He rates pain as a 3/10, usually occurs when he is walking fast, or going up stairs.  Pain usually last for couple of seconds and then goes away sometimes with rest.  Patient states wheezing often, and thinks likely has lung disease.  He is currently having some back pain due to sciatic nerve, also endorses a history of spondylosis.  He states his blood pressure is usually normal when not in pain.  He denies orthopnea.  States his father had an MI in his 21s.  Past Medical History:  Diagnosis Date  . Allergy   . Coronary artery disease   . DDD (degenerative disc disease)   . Diabetes mellitus   . Insomnia   . Lumbar stenosis   . Retinopathy due to secondary diabetes ([place]) 06/24/12   both eyes    Past Surgical History:  Procedure Laterality Date  . EXCISIONAL HEMORRHOIDECTOMY  2012  . ORIF FEMUR FRACTURE  1989  . SPHINCTEROTOMY    . TOOTH EXTRACTION      Current Medications: Current Meds  Medication Sig  . albuterol (PROVENTIL HFA;VENTOLIN HFA) 108 (90 Base) MCG/ACT inhaler Inhale 2-4 puffs by mouth every 4 hours as needed for wheezing, cough, and/or shortness of breath  . aspirin 81 MG EC tablet Take 81 mg by mouth daily. Swallow whole.  Marland Kitchen atorvastatin (LIPITOR) 20 MG tablet Take 1 tablet  (20 mg total) by mouth at bedtime.  . Blood Glucose Monitoring Suppl (BAYER CONTOUR NEXT MONITOR) w/Device KIT 1 Device by Does not apply route 1 day or 1 dose. DX: DM E11.5  . Cinnamon 500 MG capsule Take 1,000 mg by mouth daily.  . Echinacea 400 MG CAPS Take 2 capsules by mouth.  . empagliflozin (JARDIANCE) 10 MG TABS tablet Take 10 mg by mouth daily before breakfast.  . Garlic 10 MG CAPS Take by mouth.  . metFORMIN (GLUCOPHAGE-XR) 500 MG 24 hr tablet One by mouth daily; appointment needed  . Omega-3 Fatty Acids (FISH OIL) 1000 MG CAPS Take by mouth.  Marland Kitchen OVER THE COUNTER MEDICATION Tonic water  . oxyCODONE-acetaminophen (PERCOCET) 7.5-325 MG tablet Take 1 tablet by mouth every 6 (six) hours as needed.  Marland Kitchen QUERCETIN PO Take by mouth.  Marland Kitchen ZINC-MAGNESIUM ASPART-VIT B6 PO Take by mouth.     Allergies:   Topiramate, Trazodone, Baclofen, and Diclofenac sodium   Social History   Socioeconomic History  . Marital status: Married    Spouse name: Anderson Malta  . Number of children: 3  . Years of education: Not on file  . Highest education level: Some college, no degree  Occupational History    Employer: [employer]  Social Needs  . Financial resource strain: Not very hard  .  Food insecurity    Worry: Never true    Inability: Never true  . Transportation needs    Medical: No    Non-medical: No  Tobacco Use  . Smoking status: Current Some Day Smoker    Packs/day: 0.50    Types: Cigarettes  . Smokeless tobacco: Current User  Substance and Sexual Activity  . Alcohol use: Yes    Alcohol/week: 0.0 standard drinks  . Drug use: Never  . Sexual activity: Yes    Partners: Female  Lifestyle  . Physical activity    Days per week: 0 days    Minutes per session: 0 min  . Stress: Very much  Relationships  . Social Herbalist on phone: Three times a week    Gets together: Twice a week    Attends religious service: 1 to 4 times per year    Active member of club or  organization: No    Attends meetings of clubs or organizations: Never    Relationship status: Married  Other Topics Concern  . Not on file  Social History Narrative  . Not on file     Family History: The patient's family history includes COPD in his father; Cancer in his maternal grandfather; Dementia in his father; Diabetes in his brother; Lung cancer in his mother; Prostate cancer in his father.  ROS:   Please see the history of present illness.     All other systems reviewed and are negative.  EKGs/Labs/Other Studies Reviewed:    The following studies were reviewed today:   EKG:  EKG is  ordered today.  The ekg ordered today demonstrates normal sinus rhythm, possible left atrial enlargement, left axis deviation.  Recent Labs: 01/20/2019: Magnesium 2.2 06/13/2019: ALT 16; Hemoglobin 14.1; Platelets 176 07/17/2019: BUN 12; Creat 0.92; Potassium 4.4; Sodium 138  Recent Lipid Panel    Component Value Date/Time   CHOL 149 06/13/2019 1037   CHOL 212 (H) 03/30/2016 1138   TRIG 74 06/13/2019 1037   HDL 36 (L) 06/13/2019 1037   HDL 25 (L) 03/30/2016 1138   CHOLHDL 4.1 06/13/2019 1037   VLDL 15 06/13/2019 1037   LDLCALC 98 06/13/2019 1037   LDLCALC 144 (H) 12/05/2018 1353    Physical Exam:    VS:  BP (!) 158/90 (BP Location: Right Arm, Patient Position: Sitting, Cuff Size: Large)   Pulse 90   Ht '5\' 9"'$  (1.753 m)   Wt 245 lb (111.1 kg)   SpO2 96%   BMI 36.18 kg/m     Wt Readings from Last 3 Encounters:  08/14/19 245 lb (111.1 kg)  07/17/19 243 lb (110.2 kg)  06/13/19 243 lb 6.4 oz (110.4 kg)     GEN:  Well nourished, well developed in no acute distress HEENT: Normal NECK: No JVD; No carotid bruits LYMPHATICS: No lymphadenopathy CARDIAC: RRR, no murmurs, rubs, gallops RESPIRATORY:  expiratory wheezes noted bilaterally. ABDOMEN: Soft, non-tender, non-distended MUSCULOSKELETAL:  No edema; No deformity  SKIN: Warm and dry NEUROLOGIC:  Alert and oriented x 3  PSYCHIATRIC:  Normal affect   ASSESSMENT:   Patient with chest pain, usually with exertion.  Has risk factors of diabetes, 20-year smoking history. 1. Chest pain, unspecified type   2. Smoking   3. Diabetes mellitus due to underlying condition with hyperosmolarity without coma, without long-term current use of insulin (HCC)    PLAN:    In order of problems listed above:  1. We will obtain Lexiscan myocardial perfusion imaging.  Continue aspirin Lipitor. 2. Smoking cessation advised.  Advised to see a lung specialist for possible pulmonary disease/COPD. 3. Medical therapy for diabetes.   Medication Adjustments/Labs and Tests Ordered: Current medicines are reviewed at length with the patient today.  Concerns regarding medicines are outlined above.  Orders Placed This Encounter  Procedures  . NM Myocar Multi W/Spect W/Wall Motion / EF  . EKG 12-Lead   No orders of the defined types were placed in this encounter.   Patient Instructions  Medication Instructions:  Your physician recommends that you continue on your current medications as directed. Please refer to the Current Medication list given to you today.  If you need a refill on your cardiac medications before your next appointment, please call your pharmacy.   Lab work: None ordered If you have labs (blood work) drawn today and your tests are completely normal, you will receive your results only by: Marland Kitchen MyChart Message (if you have MyChart) OR . A paper copy in the mail If you have any lab test that is abnormal or we need to change your treatment, we will call you to review the results.  Testing/Procedures: Your physician has requested that you have a lexiscan myoview. For further information please visit [email or web address]. Please follow instruction sheet, as given.    Follow-Up: At [facility], you and your health needs are our priority.  As part of our continuing mission to provide you with exceptional heart care,  we have created designated Provider Care Teams.  These Care Teams include your primary Cardiologist (physician) and Advanced Practice Providers (APPs -  Physician Assistants and Nurse Practitioners) who all work together to provide you with the care you need, when you need it. You will need a follow up appointment after the test.  You may see Dr. Garen Lah or one of the following Advanced Practice Providers on your designated Care Team:   Murray Hodgkins, NP Christell Faith, PA-C . Marrianne Mood, PA-C  Any Other Special Instructions Will Be Listed Below (If Applicable).  [place]  Your caregiver has ordered a Stress Test with nuclear imaging. The purpose of this test is to evaluate the blood supply to your heart muscle. This procedure is referred to as a "Non-Invasive Stress Test." This is because other than having an IV started in your vein, nothing is inserted or "invades" your body. Cardiac stress tests are done to find areas of poor blood flow to the heart by determining the extent of coronary artery disease (CAD). Some patients exercise on a treadmill, which naturally increases the blood flow to your heart, while others who are  unable to walk on a treadmill due to physical limitations have a pharmacologic/chemical stress agent called Lexiscan . This medicine will mimic walking on a treadmill by temporarily increasing your coronary blood flow.   Please note: these test may take anywhere between 2-4 hours to complete  PLEASE REPORT TO [facility] AT THE FIRST DESK WILL DIRECT YOU WHERE TO GO  Date of Procedure:_____________________________________  Arrival Time for Procedure:______________________________  Instructions regarding medication:   _X___ : Hold diabetes medication morning of procedure  PLEASE NOTIFY THE OFFICE AT LEAST 24 HOURS IN ADVANCE IF YOU ARE UNABLE TO KEEP YOUR APPOINTMENT.  [phone] AND  PLEASE NOTIFY NUCLEAR MEDICINE AT [facility]  AT LEAST 24 HOURS IN ADVANCE IF YOU ARE UNABLE TO KEEP YOUR APPOINTMENT. [phone]  How to prepare for your Myoview test:  1. Do not eat or drink after  midnight 2. No caffeine for 24 hours prior to test 3. No smoking 24 hours prior to test. 4. Your medication may be taken with water.  If your doctor stopped a medication because of this test, do not take that medication. 5. Ladies, please do not wear dresses.  Skirts or pants are appropriate. Please wear a short sleeve shirt. 6. No perfume, cologne or lotion. 7. Wear comfortable walking shoes. No heels!                Signed, Kate Sable, MD  08/14/2019 5:02 PM    [place]

## 2019-08-15 ENCOUNTER — Telehealth: Payer: Self-pay | Admitting: Cardiology

## 2019-08-15 NOTE — Telephone Encounter (Signed)
lmov to schedule lexi and fu per checkout [facility]

## 2019-08-27 ENCOUNTER — Other Ambulatory Visit: Payer: No Typology Code available for payment source

## 2019-08-29 ENCOUNTER — Encounter: Payer: Self-pay | Admitting: Family Medicine

## 2019-09-02 ENCOUNTER — Other Ambulatory Visit: Payer: Self-pay

## 2019-09-02 ENCOUNTER — Telehealth: Payer: Self-pay | Admitting: Cardiology

## 2019-09-02 ENCOUNTER — Encounter: Payer: Self-pay | Admitting: Family Medicine

## 2019-09-02 ENCOUNTER — Encounter (INDEPENDENT_AMBULATORY_CARE_PROVIDER_SITE_OTHER): Payer: Self-pay

## 2019-09-02 ENCOUNTER — Ambulatory Visit (INDEPENDENT_AMBULATORY_CARE_PROVIDER_SITE_OTHER): Payer: PRIVATE HEALTH INSURANCE | Admitting: Family Medicine

## 2019-09-02 ENCOUNTER — Telehealth: Payer: Self-pay | Admitting: Family Medicine

## 2019-09-02 DIAGNOSIS — J441 Chronic obstructive pulmonary disease with (acute) exacerbation: Secondary | ICD-10-CM

## 2019-09-02 DIAGNOSIS — Z20822 Contact with and (suspected) exposure to covid-19: Secondary | ICD-10-CM

## 2019-09-02 DIAGNOSIS — F17200 Nicotine dependence, unspecified, uncomplicated: Secondary | ICD-10-CM | POA: Diagnosis not present

## 2019-09-02 MED ORDER — BUDESONIDE-FORMOTEROL FUMARATE 80-4.5 MCG/ACT IN AERO: 2.0000 | INHALATION_SPRAY | Freq: Two times a day (BID) | RESPIRATORY_TRACT | 1 refills | Status: DC

## 2019-09-02 MED ORDER — ALBUTEROL SULFATE HFA 108 (90 BASE) MCG/ACT IN AERS: INHALATION_SPRAY | RESPIRATORY_TRACT | 1 refills | Status: DC

## 2019-09-02 MED ORDER — AZITHROMYCIN 250 MG PO TABS: ORAL_TABLET | ORAL | 0 refills | Status: DC

## 2019-09-02 MED ORDER — FLOVENT HFA 44 MCG/ACT IN AERO: 2.0000 | INHALATION_SPRAY | Freq: Two times a day (BID) | RESPIRATORY_TRACT | 12 refills | Status: DC

## 2019-09-02 NOTE — Progress Notes (Signed)
Name: Joel Page   MRN: [id number]    DOB: 1971-09-30   Date:09/02/2019       Progress Note  Subjective  Chief Complaint  Chief Complaint  Patient presents with  . URI    congested,hard to breath,fever,cough,throat irritated for 5 days    I connected with  Joel Page on 09/02/19 at  9:40 AM EDT by telephone and verified that I am speaking with the correct person using two identifiers.   I discussed the limitations, risks, security and privacy concerns of performing an evaluation and management service by telephone and the availability of in person appointments. Staff also discussed with the patient that there may be a patient responsible charge related to this service. Patient Location: Home Provider Location: Office Additional Individuals present: None  HPI  Pt presents with concern for respiratory illness since 08/29/2019.  He has been having head and chest congestion, subjective fevers 101F, fatigue, and shortness of breath relieved with albuterol.  No known COVID-19 exposures.  He was very sick in February 2020 and thinks he already had COVID-19, though he did not have testing at that time due to no availability of testing.  Denies chest pain.  Taking Mucinex with only minimal relief.  Patient Active Problem List   Diagnosis Date Noted  . Multiple closed fractures of ribs of right side 06/30/2019  . Esophageal dysphagia 06/30/2019  . Cervical stenosis of spinal canal 08/15/2016  . Hyperlipidemia LDL goal <100 07/04/2016  . Obesity 06/29/2016  . Back pain at L4-L5 level 04/07/2016  . Cough 03/30/2016  . Medication monitoring encounter 03/30/2016  . COPD suggested by initial evaluation ([place]) 12/22/2015  . 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  . Diabetes mellitus type 2, uncontrolled ([place]) 05/11/2015  . Current smoker 05/11/2015  . Hemorrhoids, internal 05/11/2015  . Arthralgia of hip 05/11/2015  . Degenerative joint disease (DJD) of  lumbar spine 05/11/2015  . Restless leg 05/11/2015  . Trochanteric bursitis of right hip 06/17/2013  . Sciatica 03/19/2012  . Lumbar degenerative disc disease 03/19/2012    Social History   Tobacco Use  . Smoking status: Current Some Day Smoker    Packs/day: 0.50    Types: Cigarettes  . Smokeless tobacco: Current User  Substance Use Topics  . Alcohol use: Yes    Alcohol/week: 0.0 standard drinks     Current Outpatient Medications:  .  albuterol (PROVENTIL HFA;VENTOLIN HFA) 108 (90 Base) MCG/ACT inhaler, Inhale 2-4 puffs by mouth every 4 hours as needed for wheezing, cough, and/or shortness of breath, Disp: 1 Inhaler, Rfl: 1 .  aspirin 81 MG EC tablet, Take 81 mg by mouth daily. Swallow whole., Disp: , Rfl:  .  atorvastatin (LIPITOR) 20 MG tablet, Take 1 tablet (20 mg total) by mouth at bedtime., Disp: 90 tablet, Rfl: 1 .  Blood Glucose Monitoring Suppl (BAYER CONTOUR NEXT MONITOR) w/Device KIT, 1 Device by Does not apply route 1 day or 1 dose. DX: DM E11.5, Disp: 1 kit, Rfl: 0 .  Cinnamon 500 MG capsule, Take 1,000 mg by mouth daily., Disp: , Rfl:  .  Echinacea 400 MG CAPS, Take 2 capsules by mouth., Disp: , Rfl:  .  empagliflozin (JARDIANCE) 10 MG TABS tablet, Take 10 mg by mouth daily before breakfast., Disp: 90 tablet, Rfl: 1 .  Garlic 10 MG CAPS, Take by mouth., Disp: , Rfl:  .  metFORMIN (GLUCOPHAGE-XR) 500 MG 24 hr tablet, One by mouth daily; appointment needed, Disp: 90 tablet,  Rfl: 1 .  Omega-3 Fatty Acids (FISH OIL) 1000 MG CAPS, Take by mouth., Disp: , Rfl:  .  OVER THE COUNTER MEDICATION, Tonic water, Disp: , Rfl:  .  oxyCODONE-acetaminophen (PERCOCET) 7.5-325 MG tablet, Take 1 tablet by mouth every 6 (six) hours as needed., Disp: 12 tablet, Rfl: 0 .  ZINC-MAGNESIUM ASPART-VIT B6 PO, Take by mouth., Disp: , Rfl:  .  QUERCETIN PO, Take by mouth., Disp: , Rfl:   Allergies  Allergen Reactions  . Topiramate Other (See Comments)    White light in peripheral vision  .  Trazodone Other (See Comments)    Makes tingling & numbness in hands & arms worse; causes an erection.  . Baclofen Itching  . Diclofenac Sodium Other (See Comments)    Raised blood sugar     I personally reviewed active problem list, medication list, allergies, lab results with the patient/caregiver today.  ROS  Ten systems reviewed and is negative except as mentioned in HPI  Objective  Virtual encounter, vitals not obtained.  There is no height or weight on file to calculate BMI.  Nursing Note and Vital Signs reviewed.  Physical Exam  Pulmonary/Chest: Effort normal. No respiratory distress. Speaking in complete sentences. Cough is present. Neurological: Pt is alert and oriented to person, place, and time. Coordination, speech and gait are normal.  Psychiatric: Patient has a normal mood and affect. behavior is normal. Judgment and thought content normal.  No results found for this or any previous visit (from the past 72 hour(s)).  Assessment & Plan  1. COPD with acute exacerbation ([place]) - Will try to get Symbicort approved for him - [facility] has been too expensive in the past. He has Good Rx card.   - Advised strict quarantine until cleared.  Due to his symptoms and working with the public, I recommend a 10 day quarantine - cleared to return to work on 09/09/2019 - work note to be provided.  He will call cardiology to notify that he must reschedule his stress test that was scheduled for tomorrow.   - Continue Mucinex, drink plenty of fluids, tylenol PRN for body aches and fever. - albuterol (VENTOLIN HFA) 108 (90 Base) MCG/ACT inhaler; Inhale 2-4 puffs by mouth every 4 hours as needed for wheezing, cough, and/or shortness of breath  Dispense: 18 g; Refill: 1 - MYCHART COVID-19 HOME MONITORING PROGRAM - MyChart Temperature FLOWSHEET; Future - Novel Coronavirus, NAA ([facility]) - budesonide-formoterol (SYMBICORT) 80-4.5 MCG/ACT inhaler; Inhale 2 puffs into the lungs 2 (two) times  daily.  Dispense: 1 Inhaler; Refill: 1 - azithromycin (ZITHROMAX) 250 MG tablet; Take 2 tabs on day 1; then take 1 tablet once daily  Dispense: 6 tablet; Refill: 0  2. Current smoker - Cessation strongly recommended; he has cut back due to illness, but is still smoking.  -Red flags and when to present for emergency care or RTC including fever >101.18F, chest pain, shortness of breath, new/worsening/un-resolving symptoms,  reviewed with patient at time of visit. Follow up and care instructions discussed and provided in AVS. - I discussed the assessment and treatment plan with the patient. The patient was provided an opportunity to ask questions and all were answered. The patient agreed with the plan and demonstrated an understanding of the instructions.  - The patient was advised to call back or seek an in-person evaluation if the symptoms worsen or if the condition fails to improve as anticipated.  I provided 14 minutes of non-face-to-face time during this encounter.  Raquel Sarna  Isabel Caprice, FNP

## 2019-09-02 NOTE — Telephone Encounter (Signed)
Spoke with patient and informed him that per his pharmacy benefits with his insurance company there is no preferred medication list because he only has discount coverage for medications. I also informed patient that I would mail him a coupon savings card for Symbicort.  Patient asked if the albuterol inhaler was sent in and I informed patient that it was sent to his pharmacy this morning.  Patient verbalized understanding.

## 2019-09-02 NOTE — Telephone Encounter (Signed)
Joel Page - can you pull through a list of preferred medications on his insurance?  If still too expensive with insurance, please look into symbicort, advair, or Breo coupons for the patient and let me know if I need to switch him to one of these options.

## 2019-09-02 NOTE — Telephone Encounter (Signed)
Please call in a new medication

## 2019-09-02 NOTE — Telephone Encounter (Signed)
Patient's stress test will need to be cancelled and rescheduled per protocol.

## 2019-09-02 NOTE — Telephone Encounter (Signed)
Joel Page's office calling to make office aware  States that patient indicated COVID symptoms on virtual visit today - they sent him for COVID testing Patient has a stress test tomorrow morning Please review [name]'s notes to determine if we will carry on with stress test tomorrow or if we should cancel  Please advise

## 2019-09-02 NOTE — Telephone Encounter (Signed)
Copied from [place] [id number]. Topic: General - Inquiry >> Sep 02, 2019  9:30 AM Alease Frame wrote: Reason for CRM: Patient was told to call back in to see if he was able to afford medication that was recently sent in . He is unable to afford medication at this time . Please call back [phone]

## 2019-09-02 NOTE — Telephone Encounter (Signed)
Faxed script over

## 2019-09-02 NOTE — Telephone Encounter (Signed)
Order sent to [facility] in a CRM

## 2019-09-02 NOTE — Addendum Note (Signed)
Addended by: Hubbard Hartshorn on: 09/02/2019 01:19 PM   Modules accepted: Orders

## 2019-09-02 NOTE — Telephone Encounter (Signed)
Copied from [place] [id number]. Topic: General - Inquiry >> Sep 02, 2019  9:30 AM Alease Frame wrote: Reason for CRM: Patient was told to call back in to see if he was able to afford medication that was recently sent in . He is unable to afford medication at this time . Please call back [phone]   Also patient states nurse forgot to add medication albuterol (VENTOLIN HFA) 108 To the pharmacy pick up.

## 2019-09-03 ENCOUNTER — Ambulatory Visit: Payer: No Typology Code available for payment source

## 2019-09-03 LAB — NOVEL CORONAVIRUS, NAA: SARS-CoV-2, NAA: NOT DETECTED

## 2019-09-09 ENCOUNTER — Ambulatory Visit: Payer: No Typology Code available for payment source | Admitting: Cardiology

## 2019-09-18 ENCOUNTER — Encounter: Payer: Self-pay | Admitting: Family Medicine

## 2019-09-19 ENCOUNTER — Telehealth: Payer: Self-pay

## 2019-09-19 NOTE — Telephone Encounter (Signed)
Left detailed detail

## 2019-09-22 ENCOUNTER — Other Ambulatory Visit: Payer: Self-pay

## 2019-09-22 ENCOUNTER — Emergency Department
Admission: EM | Admit: 2019-09-22 | Discharge: 2019-09-22 | Disposition: A | Discharge status: Home / Self Care | Payer: No Typology Code available for payment source | Attending: Emergency Medicine | Admitting: Emergency Medicine

## 2019-09-22 ENCOUNTER — Encounter: Payer: Self-pay | Admitting: Emergency Medicine

## 2019-09-22 ENCOUNTER — Encounter: Payer: Self-pay | Admitting: Family Medicine

## 2019-09-22 ENCOUNTER — Emergency Department: Payer: No Typology Code available for payment source

## 2019-09-22 DIAGNOSIS — R0789 Other chest pain: Secondary | ICD-10-CM

## 2019-09-22 DIAGNOSIS — Z888 Allergy status to other drugs, medicaments and biological substances status: Secondary | ICD-10-CM | POA: Insufficient documentation

## 2019-09-22 DIAGNOSIS — F17200 Nicotine dependence, unspecified, uncomplicated: Secondary | ICD-10-CM

## 2019-09-22 DIAGNOSIS — Z7982 Long term (current) use of aspirin: Secondary | ICD-10-CM | POA: Insufficient documentation

## 2019-09-22 DIAGNOSIS — E785 Hyperlipidemia, unspecified: Secondary | ICD-10-CM | POA: Insufficient documentation

## 2019-09-22 DIAGNOSIS — Z881 Allergy status to other antibiotic agents status: Secondary | ICD-10-CM | POA: Diagnosis not present

## 2019-09-22 DIAGNOSIS — J42 Unspecified chronic bronchitis: Secondary | ICD-10-CM

## 2019-09-22 DIAGNOSIS — R0602 Shortness of breath: Secondary | ICD-10-CM

## 2019-09-22 DIAGNOSIS — Z7984 Long term (current) use of oral hypoglycemic drugs: Secondary | ICD-10-CM | POA: Insufficient documentation

## 2019-09-22 DIAGNOSIS — I2510 Atherosclerotic heart disease of native coronary artery without angina pectoris: Secondary | ICD-10-CM | POA: Insufficient documentation

## 2019-09-22 DIAGNOSIS — Z79899 Other long term (current) drug therapy: Secondary | ICD-10-CM | POA: Insufficient documentation

## 2019-09-22 DIAGNOSIS — R059 Cough, unspecified: Secondary | ICD-10-CM

## 2019-09-22 DIAGNOSIS — J449 Chronic obstructive pulmonary disease, unspecified: Secondary | ICD-10-CM

## 2019-09-22 DIAGNOSIS — E1165 Type 2 diabetes mellitus with hyperglycemia: Secondary | ICD-10-CM | POA: Diagnosis not present

## 2019-09-22 DIAGNOSIS — R05 Cough: Secondary | ICD-10-CM

## 2019-09-22 DIAGNOSIS — F17210 Nicotine dependence, cigarettes, uncomplicated: Secondary | ICD-10-CM | POA: Diagnosis not present

## 2019-09-22 LAB — CBC
HCT: 45.2 % (ref 39.0–52.0)
Hemoglobin: 14.5 g/dL (ref 13.0–17.0)
MCH: 27.9 pg (ref 26.0–34.0)
MCHC: 32.1 g/dL (ref 30.0–36.0)
MCV: 87.1 fL (ref 80.0–100.0)
Platelets: 200 10*3/uL (ref 150–400)
RBC: 5.19 MIL/uL (ref 4.22–5.81)
RDW: 14.6 % (ref 11.5–15.5)
WBC: 10.2 10*3/uL (ref 4.0–10.5)
nRBC: 0 % (ref 0.0–0.2)

## 2019-09-22 LAB — BASIC METABOLIC PANEL
Anion gap: 11 (ref 5–15)
BUN: 14 mg/dL (ref 6–20)
CO2: 24 mmol/L (ref 22–32)
Calcium: 8.7 mg/dL — ABNORMAL LOW (ref 8.9–10.3)
Chloride: 102 mmol/L (ref 98–111)
Creatinine, Ser: 0.75 mg/dL (ref 0.61–1.24)
GFR calc Af Amer: >60 mL/min (ref >60)
GFR calc non Af Amer: >60 mL/min (ref >60)
Glucose, Bld: 226 mg/dL — ABNORMAL HIGH (ref 70–99)
Potassium: 4.1 mmol/L (ref 3.5–5.1)
Sodium: 137 mmol/L (ref 135–145)

## 2019-09-22 LAB — TROPONIN I (HIGH SENSITIVITY): Troponin I (High Sensitivity): 7 ng/L (ref <18)

## 2019-09-22 NOTE — ED Triage Notes (Signed)
Pt reports has been having some chest pain and called his MD. Pt reports was scheduled for a stress test but had to cancel it due to his bronchitis. He had to get a COVID test and it was negative.

## 2019-09-22 NOTE — ED Provider Notes (Signed)
[facility] Emergency Department Provider Note  ____________________________________________  Time seen: Approximately 9:52 AM  I have reviewed the triage vital signs and the nursing notes.   HISTORY  Chief Complaint Chest Pain and Cough    HPI Joel Page is a 48 y.o. male Patient presents with sudden onset of right chest wall pain occurring this morning while at a convenient store after a forceful coughing episode.  Denies shortness of breath, no vomiting or diaphoresis or exertional symptoms.  No weight changes.  Does hurt to breathe.  It is intermittent, worse with movement and position change.  No specific alleviating factors.  Feels similar to prior pain that he has had in that area from previous rib fractures associated with coughing and Covid illness that he had about 9 months ago.  He is worried that he might have lung cancer because his mother did and he read on the Internet that his symptoms could be due to cancer.       Past Medical History:  Diagnosis Date  . Allergy   . Coronary artery disease   . DDD (degenerative disc disease)   . Diabetes mellitus   . Insomnia   . Lumbar stenosis   . Retinopathy due to secondary diabetes ([place]) 06/24/12   both eyes     Patient Active Problem List   Diagnosis Date Noted  . Multiple closed fractures of ribs of right side 06/30/2019  . Esophageal dysphagia 06/30/2019  . Cervical stenosis of spinal canal 08/15/2016  . Hyperlipidemia LDL goal <100 07/04/2016  . Obesity 06/29/2016  . Back pain at L4-L5 level 04/07/2016  . Cough 03/30/2016  . Medication monitoring encounter 03/30/2016  . COPD suggested by initial evaluation ([place]) 12/22/2015  . 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  . Diabetes mellitus type 2, uncontrolled ([place]) 05/11/2015  . Current smoker 05/11/2015  . Hemorrhoids, internal 05/11/2015  . Arthralgia of hip 05/11/2015  . Degenerative joint disease (DJD) of lumbar  spine 05/11/2015  . Restless leg 05/11/2015  . Trochanteric bursitis of right hip 06/17/2013  . Sciatica 03/19/2012  . Lumbar degenerative disc disease 03/19/2012     Past Surgical History:  Procedure Laterality Date  . EXCISIONAL HEMORRHOIDECTOMY  2012  . ORIF FEMUR FRACTURE  1989  . SPHINCTEROTOMY    . TOOTH EXTRACTION       Prior to Admission medications   Medication Sig Start Date End Date Taking? Authorizing Provider  albuterol (VENTOLIN HFA) 108 (90 Base) MCG/ACT inhaler Inhale 2-4 puffs by mouth every 4 hours as needed for wheezing, cough, and/or shortness of breath 09/02/19   Hubbard Hartshorn, FNP  aspirin 81 MG EC tablet Take 81 mg by mouth daily. Swallow whole.    [provider]  atorvastatin (LIPITOR) 20 MG tablet Take 1 tablet (20 mg total) by mouth at bedtime. 06/13/19   Hubbard Hartshorn, FNP  azithromycin (ZITHROMAX) 250 MG tablet Take 2 tabs on day 1; then take 1 tablet once daily 09/02/19   Hubbard Hartshorn, FNP  Blood Glucose Monitoring Suppl (BAYER CONTOUR NEXT MONITOR) w/Device KIT 1 Device by Does not apply route 1 day or 1 dose. DX: DM E11.5 12/22/15   Bobetta Lime, MD  budesonide-formoterol [facility]) 80-4.5 MCG/ACT inhaler Inhale 2 puffs into the lungs 2 (two) times daily. 09/02/19   Hubbard Hartshorn, FNP  Cinnamon 500 MG capsule Take 1,000 mg by mouth daily.    [provider]  Echinacea 400 MG CAPS Take 2  capsules by mouth.    [provider]  empagliflozin (JARDIANCE) 10 MG TABS tablet Take 10 mg by mouth daily before breakfast. 07/18/19   Hubbard Hartshorn, FNP  fluticasone (FLOVENT HFA) 44 MCG/ACT inhaler Inhale 2 puffs into the lungs 2 (two) times daily. 09/02/19   Hubbard Hartshorn, FNP  Garlic 10 MG CAPS Take by mouth.    [provider]  metFORMIN (GLUCOPHAGE-XR) 500 MG 24 hr tablet One by mouth daily; appointment needed 07/18/19   Hubbard Hartshorn, FNP  Omega-3 Fatty Acids (FISH OIL) 1000 MG CAPS Take by mouth.    [provider]  OVER THE COUNTER MEDICATION Tonic water    [provider]  oxyCODONE-acetaminophen (PERCOCET) 7.5-325 MG tablet Take 1 tablet by mouth every 6 (six) hours as needed. 07/17/19   Sable Feil, PA-C  QUERCETIN PO Take by mouth.    [provider]  ZINC-MAGNESIUM ASPART-VIT B6 PO Take by mouth.    [provider]     Allergies Topiramate, Trazodone, Baclofen, and Diclofenac sodium   Family History  Problem Relation Age of Onset  . Lung cancer Mother   . Dementia Father   . COPD Father   . Prostate cancer Father   . Diabetes Brother   . Cancer Maternal Grandfather     Social History Social History   Tobacco Use  . Smoking status: Current Some Day Smoker    Packs/day: 0.50    Types: Cigarettes  . Smokeless tobacco: Current User  Substance Use Topics  . Alcohol use: Yes    Alcohol/week: 0.0 standard drinks  . Drug use: Never    Review of Systems  Constitutional:   No fever or chills.  ENT:   No sore throat. No rhinorrhea. Cardiovascular: Positive chest pain as above without syncope. Respiratory:   No dyspnea positive chronic cough. Gastrointestinal:   Negative for abdominal pain, vomiting and diarrhea.  Musculoskeletal:   Negative for focal pain or swelling All other systems reviewed and are negative except as documented above in ROS and HPI.  ____________________________________________   PHYSICAL EXAM:  VITAL SIGNS: ED Triage Vitals  Enc Vitals Group     BP 09/22/19 0716 131/80     Pulse Rate 09/22/19 0716 86     Resp 09/22/19 0716 20     Temp 09/22/19 0716 99.6 F (37.6 C)     Temp Source 09/22/19 0716 Oral     SpO2 09/22/19 0716 93 %     Weight 09/22/19 0717 232 lb (105.2 kg)     Height 09/22/19 0717 '5\' 9"'$  (1.753 m)     Head Circumference --      Peak Flow --      Pain Score 09/22/19 0717 3     Pain Loc --      Pain Edu? --      Excl. in [place]? --     Vital signs reviewed, nursing assessments  reviewed.   Constitutional:   Alert and oriented. Non-toxic appearance. Eyes:   Conjunctivae are normal. EOMI. PERRL. ENT      Head:   Normocephalic and atraumatic.      Nose:   Normal      Mouth/Throat:   Moist mucous membranes      Neck:   No meningismus. Full ROM. Hematological/Lymphatic/Immunilogical:   No cervical lymphadenopathy. Cardiovascular:   RRR. Symmetric bilateral radial and DP pulses.  No murmurs. Cap refill less than 2 seconds. Respiratory:   Normal respiratory  effort without tachypnea/retractions.  Coarse breath sounds and mild expiratory wheezing diffusely.  Normal expiratory phase with FEV1 maneuver.  No inducible bronchospasm Gastrointestinal:   Soft and nontender. Non distended. There is no CVA tenderness.  No rebound, rigidity, or guarding.  Musculoskeletal:   Normal range of motion in all extremities. No joint effusions.  No lower extremity tenderness.  No edema. Neurologic:   Normal speech and language.  Motor grossly intact. No acute focal neurologic deficits are appreciated.  Skin:    Skin is warm, dry and intact. No rash noted.  No petechiae, purpura, or bullae.  ____________________________________________    LABS (pertinent positives/negatives) (all labs ordered are listed, but only abnormal results are displayed) Labs Reviewed  BASIC METABOLIC PANEL - Abnormal; Notable for the following components:      Result Value   Glucose, Bld 226 (*)    Calcium 8.7 (*)    All other components within normal limits  CBC  TROPONIN I (HIGH SENSITIVITY)   ____________________________________________   EKG  Interpreted by me Normal sinus rhythm rate of 90, normal axis, normal intervals.  Poor R wave progression.  Normal ST segments and T waves.  ____________________________________________    [id number]  Dg Chest 2 View  Result Date: 09/22/2019 CLINICAL DATA:  History of broken ribs now with worsening right-sided chest pain after a cough. EXAM: CHEST - 2  VIEW COMPARISON:  06/13/2019; 05/08/2010; right rib radiographic series-07/17/2019 FINDINGS: Grossly unchanged cardiac silhouette and mediastinal contours. No focal airspace opacities. No pleural effusion or pneumothorax. No evidence of edema. Callus formation surrounds the anterior aspects of the right 6th, 7th and 9th ribs. No definite acute displaced right-sided rib fractures. IMPRESSION: 1. Hyperexpanded lungs without superimposed acute cardiopulmonary disease. 2. Old fractures involving the right 6th, 7th and 9th ribs with associated callus formation. Electronically Signed   By: Sandi Mariscal M.D.   On: 09/22/2019 08:02    ____________________________________________   PROCEDURES Procedures  ____________________________________________  DIFFERENTIAL DIAGNOSIS   Chest wall strain, recurrent rib fracture, pneumothorax  CLINICAL IMPRESSION / ASSESSMENT AND PLAN / ED COURSE  Medications ordered in the ED: Medications - No data to display  Pertinent labs & imaging results that were available during my care of the patient were reviewed by me and considered in my medical decision making (see chart for details).  Joel Page was evaluated in Emergency Department on 09/22/2019 for the symptoms described in the history of present illness. He was evaluated in the context of the global COVID-19 pandemic, which necessitated consideration that the patient might be at risk for infection with the SARS-CoV-2 virus that causes COVID-19. Institutional protocols and algorithms that pertain to the evaluation of patients at risk for COVID-19 are in a state of rapid change based on information released by regulatory bodies including the CDC and federal and state organizations. These policies and algorithms were followed during the patient's care in the ED.   Patient presents with atypical chest pain/chest wall pain.Considering the patient's symptoms, medical history, and physical examination today, I  have low suspicion for ACS, PE, TAD, pneumothorax, carditis, mediastinitis, pneumonia, CHF, or sepsis.  Most likely muscle strain.  Chest x-ray EKG and labs are unremarkable.  Vital signs are normal, stable for discharge home, NSAIDs and conservative management.     ____________________________________________   FINAL CLINICAL IMPRESSION(S) / ED DIAGNOSES    Final diagnoses:  Type 2 diabetes mellitus with hyperglycemia, without long-term current use of insulin (HCC)  Chest wall pain  Chronic  bronchitis, unspecified chronic bronchitis type [facility])     ED Discharge Orders    None      Portions of this note were generated with dragon dictation software. Dictation errors may occur despite best attempts at proofreading.   Carrie Mew, MD 09/22/19 [phone]

## 2019-09-22 NOTE — ED Triage Notes (Signed)
Pt reports was on his way to work and he coughed and felt a pop in his right side chest under his breast. Pt states this has happened in the past and when it did he had broken 2 ribs. Pt denies SOB, fevers or other sx's. Pt states hx of bronchitis.

## 2019-09-22 NOTE — ED Triage Notes (Signed)
Pt reports concerns of lung cancer. Pt states he has been waking up with a sweet taste in his mouth and he read on the Internet that was a sign of lung cancer. Pt reports is a smoker but has cut back. Pt requesting a CT scan to rule out lung cancer.

## 2019-09-30 ENCOUNTER — Telehealth: Payer: Self-pay | Admitting: Family Medicine

## 2019-09-30 ENCOUNTER — Other Ambulatory Visit: Payer: Self-pay

## 2019-09-30 ENCOUNTER — Encounter: Payer: Self-pay | Admitting: Family Medicine

## 2019-09-30 ENCOUNTER — Ambulatory Visit (INDEPENDENT_AMBULATORY_CARE_PROVIDER_SITE_OTHER): Payer: PRIVATE HEALTH INSURANCE | Admitting: Family Medicine

## 2019-09-30 DIAGNOSIS — Z862 Personal history of diseases of the blood and blood-forming organs and certain disorders involving the immune mechanism: Secondary | ICD-10-CM

## 2019-09-30 DIAGNOSIS — R1319 Other dysphagia: Secondary | ICD-10-CM

## 2019-09-30 DIAGNOSIS — E1165 Type 2 diabetes mellitus with hyperglycemia: Secondary | ICD-10-CM

## 2019-09-30 DIAGNOSIS — E785 Hyperlipidemia, unspecified: Secondary | ICD-10-CM

## 2019-09-30 DIAGNOSIS — S2241XD Multiple fractures of ribs, right side, subsequent encounter for fracture with routine healing: Secondary | ICD-10-CM | POA: Diagnosis not present

## 2019-09-30 DIAGNOSIS — J449 Chronic obstructive pulmonary disease, unspecified: Secondary | ICD-10-CM | POA: Diagnosis not present

## 2019-09-30 DIAGNOSIS — R1310 Dysphagia, unspecified: Secondary | ICD-10-CM | POA: Diagnosis not present

## 2019-09-30 DIAGNOSIS — F17200 Nicotine dependence, unspecified, uncomplicated: Secondary | ICD-10-CM | POA: Diagnosis not present

## 2019-09-30 MED ORDER — METFORMIN HCL ER 500 MG PO TB24: ORAL_TABLET | ORAL | 1 refills | Status: DC

## 2019-09-30 NOTE — Telephone Encounter (Signed)
I see that no one has touched it since I sent it but I think they are having some issues due to their recent turnover.  I will send a message to their referral coordinator but the patient can always feel free to contact them at [phone].

## 2019-09-30 NOTE — Telephone Encounter (Signed)
Please check on pulmonology referral - he has not heard back yet from [facility]. Thanks!

## 2019-09-30 NOTE — Telephone Encounter (Signed)
Joel Page - please give phone number for pulmonology to Joel Page. Thanks.

## 2019-09-30 NOTE — Progress Notes (Signed)
Name: Joel Page   MRN: [id number]    DOB: 1971/11/15   Date:09/30/2019       Progress Note  Subjective  Chief Complaint  Chief Complaint  Patient presents with   Chest Pain    right chest wall, possible Pulmonary, whenever he get congested feels like something pops    I connected with  [name]  on 09/30/19 at 10:00 AM EST by a video enabled telemedicine application and verified that I am speaking with the correct person using two identifiers.  I discussed the limitations of evaluation and management by telemedicine and the availability of in person appointments. The patient expressed understanding and agreed to proceed. Staff also discussed with the patient that there may be a patient responsible charge related to this service. Patient Location: Home Provider Location: Office Additional Individuals present: None  HPI  COPD, RIGHT Sided ribcage pain s/p fracture, Tobacco abuse: He had X-ray 06/13/2019 that showed healing subacute chronic right anterior 6th and 7th rib fractures. Had follow up CXR on 09/22/2019 which showed "old fractures of 6th, 7th, and 9th ribs".  He notes pain has been slowly improving, having pain when coughing or with certain movements still ongoing.  He also had CT Angio of the chest 01/19/2019 when presenting to the ER - this did show mildly enlarged right hilar lymph nodes of indeterminate etiology.   - He notes that his mother died from [place] that he feels was caught too late, and he is concerned about this for himself as well.   - Was smoking 1- 1.5 ppd, now down to less than 1ppd.  Does have patches and gum, but likes to use Velo pouches.  States this has really helped his shortness of breath and cough.   Dysphagia: Has been intermittent for years; notes family history of the same. He notes his father aspirated and died from lung infection secondary to this. Occasional choking episodes, but has never needed heimlich maneuver. He had  an appointment 07/29/2019 with Dr. Vicente Males, but had to cancel last minute due to work.  Discussed choking precautions - have a drink nearby to ensure proper moisture of his food, cutting meats into small pieces. He will call GI office to reschedule.  DM with Obesity: BG's have been between 120-130. Misses doses on occasionally.  Denies polydipsia, polyphagia, or polyuria.  Most recent Urine micro was WNL. He does still eat some carbohydrates and sweets, but limits portions; most of his diet is vegetables, fruits, and meats. Taking metformin 513m XR once daily and statin.  He states that JVania Reawas too expensive; we will increase metformin today to 10082mXR daily.   Not on ACE or ARB. Has never attended DM classes.  HLD: Denies chest pain (aside from right ribcage pain), shortness of breath, or myalgias.  Taking atorvastatin and fish oil. Last check was not quite to goal - due for labs.   Leukocytosis: He has pattern of mildly elevated WBC's. LAst check 8 days ago was WNL.  Patient Active Problem List   Diagnosis Date Noted   Multiple closed fractures of ribs of right side 06/30/2019   Esophageal dysphagia 06/30/2019   Cervical stenosis of spinal canal 08/15/2016   Hyperlipidemia LDL goal <100 07/04/2016   Obesity 06/29/2016   Back pain at L4-L5 level 04/07/2016   Cough 03/30/2016   Medication monitoring encounter 03/30/2016   COPD suggested by initial evaluation ([place]/11/2015   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  Diabetes mellitus type 2, uncontrolled ([place]) 05/11/2015   Current smoker 05/11/2015   Hemorrhoids, internal 05/11/2015   Arthralgia of hip 05/11/2015   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   Restless leg 05/11/2015   Trochanteric bursitis of right hip 06/17/2013   Sciatica 03/19/2012   Lumbar degenerative disc disease 03/19/2012    Past Surgical History:  Procedure Laterality Date   EXCISIONAL  HEMORRHOIDECTOMY  2012   ORIF FEMUR FRACTURE  1989   SPHINCTEROTOMY     TOOTH EXTRACTION      Family History  Problem Relation Age of Onset   Lung cancer Mother    Dementia Father    COPD Father    Prostate cancer Father    Diabetes Brother    Cancer Maternal Grandfather     Social History   Socioeconomic History   Marital status: Married    Spouse name: Anderson Malta   Number of children: 3   Years of education: Not on file   Highest education level: Some college, no degree  Occupational History    Employer: [employer]  Social Designer, 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   Food insecurity    Worry: Never true    Inability: Never true   Transportation needs    Medical: No    Non-medical: No  Tobacco Use   Smoking status: Current Some Day Smoker    Packs/day: 0.50    Types: Cigarettes   Smokeless tobacco: Current User  Substance and Sexual Activity   Alcohol use: Yes    Alcohol/week: 0.0 standard drinks   Drug use: Never   Sexual activity: Yes    Partners: Female  Lifestyle   Physical activity    Days per week: 0 days    Minutes per session: 0 min   Stress: Very much  Relationships   Social connections    Talks on phone: Three times a week    Gets together: Twice a week    Attends religious service: 1 to 4 times per year    Active member of club or organization: No    Attends meetings of clubs or organizations: Never    Relationship status: Married   Intimate partner violence    Fear of current or ex partner: No    Emotionally abused: No    Physically abused: No    Forced sexual activity: No  Other Topics Concern   Not on file  Social History Narrative   Not on file     Current Outpatient Medications:    albuterol (VENTOLIN HFA) 108 (90 Base) MCG/ACT inhaler, Inhale 2-4 puffs by mouth every 4 hours as needed for wheezing, cough, and/or shortness of breath, Disp: 18 g, Rfl: 1   aspirin 81 MG EC tablet, Take 81  mg by mouth daily. Swallow whole., Disp: , Rfl:    atorvastatin (LIPITOR) 20 MG tablet, Take 1 tablet (20 mg total) by mouth at bedtime., Disp: 90 tablet, Rfl: 1   Blood Glucose Monitoring Suppl (BAYER CONTOUR NEXT MONITOR) w/Device KIT, 1 Device by Does not apply route 1 day or 1 dose. DX: DM E11.5, Disp: 1 kit, Rfl: 0   Cinnamon 500 MG capsule, Take 1,000 mg by mouth daily., Disp: , Rfl:    Echinacea 400 MG CAPS, Take 2 capsules by mouth., Disp: , Rfl:    empagliflozin (JARDIANCE) 10 MG TABS tablet, Take 10 mg by mouth daily before breakfast., Disp: 90 tablet, Rfl: 1   fluticasone (FLOVENT  HFA) 44 MCG/ACT inhaler, Inhale 2 puffs into the lungs 2 (two) times daily., Disp: 1 Inhaler, Rfl: 12   Garlic 10 MG CAPS, Take by mouth., Disp: , Rfl:    metFORMIN (GLUCOPHAGE-XR) 500 MG 24 hr tablet, One by mouth daily; appointment needed, Disp: 90 tablet, Rfl: 1   Omega-3 Fatty Acids (FISH OIL) 1000 MG CAPS, Take by mouth., Disp: , Rfl:    OVER THE COUNTER MEDICATION, Tonic water, Disp: , Rfl:    QUERCETIN PO, Take by mouth., Disp: , Rfl:    ZINC-MAGNESIUM ASPART-VIT B6 PO, Take by mouth., Disp: , Rfl:    azithromycin (ZITHROMAX) 250 MG tablet, Take 2 tabs on day 1; then take 1 tablet once daily (Patient not taking: Reported on 09/30/2019), Disp: 6 tablet, Rfl: 0   budesonide-formoterol (SYMBICORT) 80-4.5 MCG/ACT inhaler, Inhale 2 puffs into the lungs 2 (two) times daily. (Patient not taking: Reported on 09/30/2019), Disp: 1 Inhaler, Rfl: 1   oxyCODONE-acetaminophen (PERCOCET) 7.5-325 MG tablet, Take 1 tablet by mouth every 6 (six) hours as needed. (Patient not taking: Reported on 09/30/2019), Disp: 12 tablet, Rfl: 0  Allergies  Allergen Reactions   Topiramate Other (See Comments)    White light in peripheral vision   Trazodone Other (See Comments)    Makes tingling & numbness in hands & arms worse; causes an erection.   Baclofen Itching   Diclofenac Sodium Other (See Comments)     Raised blood sugar     I personally reviewed active problem list, medication list, allergies, notes from last encounter, lab results with the patient/caregiver today.   ROS  Constitutional: Negative for fever or weight change.  Respiratory: Still having ongoing cough and shortness of breath.   Cardiovascular: Negative for chest pain or palpitations.  Gastrointestinal: Negative for abdominal pain, no bowel changes.  Musculoskeletal: Negative for gait problem or joint swelling.  Skin: Negative for rash.  Neurological: Negative for dizziness or headache.  No other specific complaints in a complete review of systems (except as listed in HPI above).  Objective  Virtual encounter, vitals not obtained.  There is no height or weight on file to calculate BMI.  Physical Exam Pulmonary/Chest: Effort normal. No respiratory distress. Speaking in complete sentences Neurological: Pt is alert and oriented to person, place, and time. Coordination, speech and gait are normal.  Psychiatric: Patient has a normal mood and affect. behavior is normal. Judgment and thought content normal.  No results found for this or any previous visit (from the past 72 hour(s)).  PHQ2/9: Depression screen [facility] 2/9 09/30/2019 09/02/2019 06/30/2019 06/13/2019 03/13/2019  Decreased Interest 0 0 0 0 0  Down, Depressed, Hopeless 0 0 0 0 0  PHQ - 2 Score 0 0 0 0 0  Altered sleeping 0 0 0 0 0  Tired, decreased energy 0 0 0 0 0  Change in appetite 0 0 0 0 0  Feeling bad or failure about yourself  0 0 0 0 0  Trouble concentrating 0 0 0 0 0  Moving slowly or fidgety/restless 0 0 0 0 0  Suicidal thoughts 0 0 0 0 0  PHQ-9 Score 0 0 0 0 0  Difficult doing work/chores Not difficult at all Not difficult at all Not difficult at all Not difficult at all Not difficult at all  Some recent data might be hidden   PHQ-2/9 Result is negative.    Fall Risk: Fall Risk  09/30/2019 09/02/2019 06/30/2019 06/13/2019 03/13/2019  Falls in the  past year?  0 0 0 0 0  Number falls in past yr: 0 0 0 0 0  Injury with Fall? 0 0 0 0 0  Risk for fall due to : - - - - -  Follow up Falls evaluation completed Falls evaluation completed Falls evaluation completed - -    Assessment & Plan  1. Closed fracture of multiple ribs of right side with routine healing, subsequent encounter - Healing appropriately per most recent CXR.    2. COPD suggested by initial evaluation [facility]) - Has referral in for pulmonology, note sent to referrals coordinator to check on this; has family history of lung cancer and is working on quitting smoking.  3. Current smoker - Cessation discussed; he is using nicotine replacement right now.  4. Esophageal dysphagia - Needs to reschedule with Dr. Vicente Males, safety discussed in detail.  5. Uncontrolled type 2 diabetes mellitus with hyperglycemia (HCC) - Increase metformin as he was not able to afford Jardiance. Labs due in 1 month - metFORMIN (GLUCOPHAGE-XR) 500 MG 24 hr tablet; Take 2 tablets once daily  Dispense: 180 tablet; Refill: 1 - COMPLETE METABOLIC PANEL WITH GFR - Hemoglobin A1c  6. Hyperlipidemia LDL goal <100 - Continue statin therapy - Lipid panel  7. History of leukocytosis - Will recheck in 1 month; most recent check was WNL, however his history does show ongoing leukocytosis over the last 2 years or so.  Will consider hematology referral after he addresses pulmonology issues if this does not improve. - CBC with Differential/Platelet   I discussed the assessment and treatment plan with the patient. The patient was provided an opportunity to ask questions and all were answered. The patient agreed with the plan and demonstrated an understanding of the instructions.  The patient was advised to call back or seek an in-person evaluation if the symptoms worsen or if the condition fails to improve as anticipated.  I provided 24 minutes of non-face-to-face time during this encounter.

## 2019-10-01 NOTE — Telephone Encounter (Signed)
Patient notified

## 2019-11-03 ENCOUNTER — Telehealth: Payer: Self-pay | Admitting: Pulmonary Disease

## 2019-11-03 NOTE — Telephone Encounter (Signed)
Spoke to pt to gather additional information regarding exposure. Pt stated that he last seen his son on 10/14/2019 and his son tested positive on around 10/28/2019. Per LG- okay to proceed with visit.  Pt is aware and voiced his understanding.

## 2019-11-04 ENCOUNTER — Other Ambulatory Visit: Payer: Self-pay

## 2019-11-04 ENCOUNTER — Encounter: Payer: Self-pay | Admitting: Pulmonary Disease

## 2019-11-04 ENCOUNTER — Ambulatory Visit (INDEPENDENT_AMBULATORY_CARE_PROVIDER_SITE_OTHER): Payer: No Typology Code available for payment source | Admitting: Pulmonary Disease

## 2019-11-04 VITALS — BP 154/86 | HR 95 | Temp 97.2°F | Ht 69.0 in | Wt 246.2 lb

## 2019-11-04 DIAGNOSIS — R0602 Shortness of breath: Secondary | ICD-10-CM

## 2019-11-04 DIAGNOSIS — F17210 Nicotine dependence, cigarettes, uncomplicated: Secondary | ICD-10-CM | POA: Diagnosis not present

## 2019-11-04 DIAGNOSIS — J449 Chronic obstructive pulmonary disease, unspecified: Secondary | ICD-10-CM

## 2019-11-04 MED ORDER — TRELEGY ELLIPTA 100-62.5-25 MCG/INH IN AEPB: 1.0000 | INHALATION_SPRAY | Freq: Every day | RESPIRATORY_TRACT | 0 refills | Status: AC

## 2019-11-04 NOTE — Progress Notes (Signed)
Subjective:    Patient ID: Joel Page, male    DOB: January 18, 1971, 48 y.o.   MRN: [id number]  HPI Patient is a 48 year old current smoker (half PPD) who presents for evaluation of shortness of breath and chest tightness for approximately 9 months duration.  Patient is kindly referred by Raelyn Ensign, FNP.  Patient states that he was told that he had "presumptive" Covid in February 2020.  Since then he has noted issues with recurrent wheezing, shortness of breath on exertion and cough productive of yellow to white mucus.  He has not had any fevers, chills or sweats.  He had issues with broken ribs on the right noted in April 2020.  Since then his chest discomfort has improved slowly though he occasionally will have some pleuritic discomfort.  His main issue is his inability to keep up with his work activities (he works as an Tax inspector) and his persistent dyspnea.  He does get fatigued easily.  He has been evaluated by cardiology and is set to get a stress test.  He has not had any other symptomatology.  He states that he has been healthy otherwise though he has issues with hyperlipidemia and diabetes mellitus.  Past medical history, surgical history and family history have been reviewed.  Social history as noted he works as a Tax inspector.  Previously was in Event organiser.  Past military history.  Patient smoked 2 packs of cigarettes per day until February 2020 when he started decreasing his cigarette use and is down to half a pack currently.  He states he started smoking in his teen years.  I have reviewed the patient's CT scan of the chest and multiple chest x-rays performed up to now.  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   Review of Systems  Constitutional: Positive for fatigue.  HENT: Negative.   Eyes: Negative.   Respiratory: Positive for  cough, chest tightness, shortness of breath and wheezing.   Cardiovascular: Positive for chest pain.  Gastrointestinal: Negative.        Does have issues with GERD.  Endocrine: Negative.   Genitourinary: Negative.   Musculoskeletal: Negative.   Skin: Negative.   Allergic/Immunologic: Negative.   Neurological: Negative.   Hematological: Negative.   Psychiatric/Behavioral: Negative.   All other systems reviewed and are negative.      Objective:   Physical Exam Vitals and nursing note reviewed.  Constitutional:      General: He is not in acute distress.    Appearance: He is obese. He is not ill-appearing.  HENT:     Head: Normocephalic and atraumatic.     Right Ear: External ear normal.     Nose:     Comments: Nose/mouth/throat not examined due to masking requirements for COVID 19. Eyes:     General: No scleral icterus.    Conjunctiva/sclera: Conjunctivae normal.     Pupils: Pupils are equal, round, and reactive to light.  Neck:     Thyroid: No thyromegaly.     Trachea: Trachea and phonation normal.  Cardiovascular:     Rate and Rhythm: Normal rate and regular rhythm.     Pulses: Normal pulses.     Heart sounds: Normal heart sounds.  Pulmonary:     Effort: Pulmonary effort is normal.     Breath sounds: Wheezing (Musical, throughout) present.  Abdominal:  General: Abdomen is protuberant. There is no distension.     Palpations: Abdomen is soft.  Musculoskeletal:        General: Normal range of motion.     Cervical back: Neck supple.     Right lower leg: No edema.     Left lower leg: No edema.  Lymphadenopathy:     Cervical: No cervical adenopathy.  Skin:    General: Skin is warm and dry.  Neurological:     General: No focal deficit present.     Mental Status: He is alert and oriented to person, place, and time.  Psychiatric:        Mood and Affect: Mood normal.        Behavior: Behavior normal.     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      Assessment & Plan:   1.  Asthma/COPD overlap syndrome: Poorly controlled.  Patient will begin a trial of Trelegy Ellipta 1 inhalation daily, he was instructed on the proper use of the dry powder inhaler and proper mouth rinsing afterwards.  Patient was given samples to cover 1 months supply.  He was provided coupons and an application for [place] assistance.  He may still use his albuterol up to 4 times a day as needed.  He will be scheduled for pulmonary function testing.  He has been advised with regards to discontinuation of smoking.  Follow-up in this regard and 6 to 8 weeks time.  2.  Shortness of breath: This likely related to #1 above, as noted this is poorly controlled at present.  3.  Tobacco dependence due to cigarettes.  Total counseling time 3 to 5 minutes.  Patient was advised to discontinue smoking he is currently precontemplative in this regard.    Thank you for allowing me to participate in this patient's care.   Renold Don, MD [facility]   This note was dictated using voice recognition software/Dragon.  Despite best efforts to proofread, errors can occur which can change the meaning.  Any change was purely unintentional.

## 2019-11-04 NOTE — Patient Instructions (Addendum)
1.  You will be scheduled for breathing test.  2.  Continue your efforts to stop smoking this is the best thing that you can do for your lungs.  3.  We will start you on a medication called Trelegy Ellipta, 1 puff daily.  Rinse your mouth well after use it.  Put a little baking soda in the water to help clear all remnants of the powder.  4.  You can use your rescue inhaler (albuterol) up to 4 times a day if needed for shortness of breath.  5.  We will see you in follow-up in 6 to 8 weeks time to see how you are coming along.  Call sooner should you have any new breathing difficulties.

## 2019-11-07 NOTE — Telephone Encounter (Signed)
Patient wants to wait until march .  He will see how pft goes and then set something up .

## 2019-11-11 ENCOUNTER — Telehealth: Payer: Self-pay | Admitting: Pulmonary Disease

## 2019-11-11 MED ORDER — TRELEGY ELLIPTA 100-62.5-25 MCG/INH IN AEPB: 1.0000 | INHALATION_SPRAY | Freq: Every day | RESPIRATORY_TRACT | 5 refills | Status: DC

## 2019-11-11 NOTE — Telephone Encounter (Signed)
Called and spoke with pt. Pt said he was given samples of the trelegy which worked well for him and would like to go ahead and have rx sent to pharmacy due to having a savings card that expires at the end of the month. Verified pt's preferred pharmacy and sent Rx in for pt.nothing further needed.

## 2019-11-27 ENCOUNTER — Telehealth: Payer: Self-pay | Admitting: Pulmonary Disease

## 2019-11-27 NOTE — Telephone Encounter (Signed)
Lm for pt.  Pt will need to contact cardiopulmonary at [phone] to reschedule PFT.

## 2019-12-01 NOTE — Telephone Encounter (Signed)
Lm for pt

## 2019-12-01 NOTE — Telephone Encounter (Signed)
Spoke to pt, who stated that he would like to cancel PFT for 12/23/19 I have spoken to Irwin with cardiopulmonary and requested that PFT be canceled.  Nothing further is needed.

## 2019-12-15 ENCOUNTER — Encounter: Payer: Self-pay | Admitting: Family Medicine

## 2019-12-18 ENCOUNTER — Ambulatory Visit: Payer: No Typology Code available for payment source | Admitting: Pulmonary Disease

## 2019-12-18 ENCOUNTER — Encounter: Payer: Self-pay | Admitting: Pulmonary Disease

## 2019-12-18 ENCOUNTER — Other Ambulatory Visit: Payer: Self-pay

## 2019-12-18 VITALS — BP 118/72 | HR 75 | Temp 96.8°F | Ht 69.0 in | Wt 244.2 lb

## 2019-12-18 DIAGNOSIS — R079 Chest pain, unspecified: Secondary | ICD-10-CM

## 2019-12-18 DIAGNOSIS — R0602 Shortness of breath: Secondary | ICD-10-CM

## 2019-12-18 DIAGNOSIS — F17210 Nicotine dependence, cigarettes, uncomplicated: Secondary | ICD-10-CM

## 2019-12-18 MED ORDER — NITROGLYCERIN 0.4 MG SL SUBL: 0.4000 mg | SUBLINGUAL_TABLET | SUBLINGUAL | 0 refills | Status: DC | PRN

## 2019-12-18 MED ORDER — TRELEGY ELLIPTA 100-62.5-25 MCG/INH IN AEPB: 1.0000 | INHALATION_SPRAY | Freq: Every day | RESPIRATORY_TRACT | 0 refills | Status: DC

## 2019-12-18 NOTE — Progress Notes (Signed)
    Assessment & Plan:  1. Chest pain, unspecified type (Primary) - Ambulatory referral to Cardiology  2. Exertional chest pain  3. Tobacco dependence due to cigarettes  4. SOB (shortness of breath)   Patient Instructions  We have provided you with samples of Trelegy Ellipta .  We also make an appointment for the cardiology group to evaluate your chest discomfort.  Continue taking enteric-coated aspirin  as you are doing.  We have provided you with a bottle of nitroglycerin  tablets to take if needed.  Follow-up 3 months or as needed   Please note: late entry documentation due to logistical difficulties during COVID-19 pandemic. This note is filed for information purposes only, and is not intended to be used for billing, nor does it represent the full scope/nature of the visit in question. Please see any associated scanned media linked to date of encounter for additional pertinent information.  Subjective:    HPI: Joel Page is a 49 y.o. male presenting to the pulmonology clinic on 12/18/2019 with report of: Follow-up (Pt stated that the Trelegy worked well. Pt has been experiencing chest pain radiating into his neck after exertion. SOB,cough, and wheezing has improved. Pt denies any fever or chills)     Outpatient Encounter Medications as of 12/18/2019  Medication Sig   albuterol  (VENTOLIN  HFA) 108 (90 Base) MCG/ACT inhaler Inhale 2-4 puffs by mouth every 4 hours as needed for wheezing, cough, and/or shortness of breath (Patient not taking: Reported on 01/04/2021)   [DISCONTINUED] aspirin  81 MG EC tablet Take 81 mg by mouth daily. Swallow whole.   [DISCONTINUED] atorvastatin  (LIPITOR) 20 MG tablet Take 1 tablet (20 mg total) by mouth at bedtime. (Patient not taking: Reported on 01/08/2020)   [DISCONTINUED] Blood Glucose Monitoring Suppl (BAYER CONTOUR NEXT MONITOR) w/Device KIT 1 Device by Does not apply route 1 day or 1 dose. DX: DM E11.5 (Patient not taking: Reported on  01/08/2020)   [DISCONTINUED] Cinnamon 500 MG capsule Take 1,000 mg by mouth daily.   [DISCONTINUED] Echinacea 400 MG CAPS Take 2 capsules by mouth daily.    [DISCONTINUED] empagliflozin  (JARDIANCE ) 10 MG TABS tablet Take 10 mg by mouth daily before breakfast. (Patient not taking: Reported on 12/22/2019)   [DISCONTINUED] Fluticasone -Umeclidin-Vilant (TRELEGY ELLIPTA ) 100-62.5-25 MCG/INH AEPB Inhale 1 puff into the lungs daily. (Patient not taking: Reported on 12/22/2019)   [DISCONTINUED] Garlic 10 MG CAPS Take by mouth daily.    [DISCONTINUED] metFORMIN  (GLUCOPHAGE -XR) 500 MG 24 hr tablet Take 2 tablets once daily   [DISCONTINUED] Omega-3 Fatty Acids (FISH OIL) 1000 MG CAPS Take by mouth daily.    [DISCONTINUED] QUERCETIN PO Take by mouth.   [DISCONTINUED] ZINC-MAGNESIUM ASPART-VIT B6 PO Take by mouth daily.    Fluticasone -Umeclidin-Vilant (TRELEGY ELLIPTA ) 100-62.5-25 MCG/INH AEPB Inhale 1 puff into the lungs daily. (Patient not taking: Reported on 01/04/2021)   nitroGLYCERIN  (NITROSTAT ) 0.4 MG SL tablet Place 1 tablet (0.4 mg total) under the tongue every 5 (five) minutes as needed for chest pain.   No facility-administered encounter medications on file as of 12/18/2019.      Objective:   Vitals:   12/18/19 1555  BP: 118/72  Pulse: 75  Temp: (!) 96.8 F (36 C)  Height: 5' 9 (1.753 m)  Weight: 244 lb 3.2 oz (110.8 kg)  SpO2: 98% Comment: on ra  TempSrc: Temporal  BMI (Calculated): 36.05     Physical exam documentation is limited by delayed entry of information.

## 2019-12-18 NOTE — Patient Instructions (Addendum)
We have provided you with samples of Trelegy Ellipta.  We also make an appointment for the cardiology group to evaluate your chest discomfort.  Continue taking enteric-coated aspirin as you are doing.  We have provided you with a bottle of nitroglycerin tablets to take if needed.  Follow-up 3 months or as needed

## 2019-12-22 ENCOUNTER — Ambulatory Visit (INDEPENDENT_AMBULATORY_CARE_PROVIDER_SITE_OTHER): Payer: No Typology Code available for payment source | Admitting: Cardiology

## 2019-12-22 ENCOUNTER — Other Ambulatory Visit: Payer: Self-pay

## 2019-12-22 ENCOUNTER — Encounter: Payer: Self-pay | Admitting: Cardiology

## 2019-12-22 VITALS — BP 110/68 | HR 90 | Ht 69.0 in | Wt 246.5 lb

## 2019-12-22 DIAGNOSIS — Z01812 Encounter for preprocedural laboratory examination: Secondary | ICD-10-CM

## 2019-12-22 DIAGNOSIS — R072 Precordial pain: Secondary | ICD-10-CM

## 2019-12-22 DIAGNOSIS — F17200 Nicotine dependence, unspecified, uncomplicated: Secondary | ICD-10-CM | POA: Diagnosis not present

## 2019-12-22 DIAGNOSIS — R079 Chest pain, unspecified: Secondary | ICD-10-CM | POA: Diagnosis not present

## 2019-12-22 DIAGNOSIS — J449 Chronic obstructive pulmonary disease, unspecified: Secondary | ICD-10-CM

## 2019-12-22 HISTORY — DX: Chronic obstructive pulmonary disease, unspecified: J44.9

## 2019-12-22 MED ORDER — METOPROLOL TARTRATE 100 MG PO TABS: ORAL_TABLET | ORAL | 0 refills | Status: DC

## 2019-12-22 NOTE — Progress Notes (Signed)
Cardiology Office Note:    Date:  12/22/2019   ID:  Joel Page, DOB 1971/01/04, MRN [id number]  PCP:  Joel Hartshorn, FNP  Cardiologist:  No primary care provider on file.  Electrophysiologist:  None   Referring MD: Joel Pita, MD   Chief Complaint  Patient presents with  . office visit    Chest tightness radiating to neck; Meds verbally reviewed with patient.    History of Present Illness:    Joel Page is a 49 y.o. male with a hx of hyperlipidemia, current smoker for about 20 years who presents due to chest pain.    Patient was seen in the office about 3 months ago due to symptoms of chest pain.  Due to risk factors of hyperlipidemia, smoking, early family history of CAD with MI in his father at age 72s, Lexiscan stress test was ordered.  But test was not performed because patient developed bronchitis.  Patient states still having symptoms of chest pain, he describes as squeezing in the mid chest.  Usually occurring with exertion which he rates as a 3 out of 10.  Symptoms are relieved with rest.  In terms of been ongoing for couple of months now at least 3 to 4 months.  He also notes some exertional shortness of breath.  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  Denies palpitations, edema, orthopnea.  Past Medical History:  Diagnosis Date  . Allergy   . Coronary artery disease   . DDD (degenerative disc disease)   . Diabetes mellitus   . Insomnia   . Lumbar stenosis   . 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 EXCISIONAL HEMORRHOIDECTOMY  2012  . ORIF FEMUR FRACTURE  1989  . SPHINCTEROTOMY    . TOOTH EXTRACTION      Current Medications: Current Meds  Medication Sig  . albuterol (VENTOLIN HFA) 108 (90 Base) MCG/ACT inhaler Inhale 2-4 puffs by mouth every 4 hours as needed for wheezing, cough, and/or shortness of breath  . aspirin 81  MG EC tablet Take 81 mg by mouth daily. Swallow whole.  Marland Kitchen atorvastatin (LIPITOR) 20 MG tablet Take 1 tablet (20 mg total) by mouth at bedtime.  . Blood Glucose Monitoring Suppl (BAYER CONTOUR NEXT MONITOR) w/Device KIT 1 Device by Does not apply route 1 day or 1 dose. DX: DM E11.5  . Cinnamon 500 MG capsule Take 1,000 mg by mouth daily.  . Echinacea 400 MG CAPS Take 2 capsules by mouth daily.   . Fluticasone-Umeclidin-Vilant (TRELEGY ELLIPTA) 100-62.5-25 MCG/INH AEPB Inhale 1 puff into the lungs daily. (Patient taking differently: Inhale 1 puff into the lungs daily. occassionally)  . Garlic 10 MG CAPS Take by mouth daily.   . metFORMIN (GLUCOPHAGE-XR) 500 MG 24 hr tablet Take 2 tablets once daily  . nitroGLYCERIN (NITROSTAT) 0.4 MG SL tablet Place 1 tablet (0.4 mg total) under the tongue every 5 (five) minutes as needed for chest pain.  . Omega-3 Fatty Acids (FISH OIL) 1000 MG CAPS Take by mouth daily.   Marland Kitchen ZINC-MAGNESIUM ASPART-VIT B6 PO Take by mouth daily.      Allergies:   Topiramate, Trazodone, Baclofen, and Diclofenac sodium   Social History   Socioeconomic History  . Marital status: Married    Spouse name: Anderson Malta  . Number of children: 3  . Years of education: Not on file  .  Highest education level: Some college, no degree  Occupational History    Employer: [employer]  Tobacco Use  . Smoking status: Current Some Day Smoker    Packs/day: 2.00    Types: Cigarettes  . Smokeless tobacco: Current User  . Tobacco comment: 0.5 currently- 11/04/2019  Substance and Sexual Activity  . Alcohol use: Yes    Alcohol/week: 0.0 standard drinks  . Drug use: Never  . Sexual activity: Yes    Partners: Female  Other Topics Concern  . Not on file  Social History Narrative  . Not on file   Social Determinants of Health   Financial Resource Strain: Low Risk   . Difficulty of Paying Living Expenses: Not very hard  Food Insecurity: No Food Insecurity  . Worried About Paediatric nurse in the Last Year: Never true  . Ran Out of Food in the Last Year: Never true  Transportation Needs: No Transportation Needs  . Lack of Transportation (Medical): No  . Lack of Transportation (Non-Medical): No  Physical Activity: Inactive  . Days of Exercise per Week: 0 days  . Minutes of Exercise per Session: 0 min  Stress: Stress Concern Present  . Feeling of Stress : Very much  Social Connections: Slightly Isolated  . Frequency of Communication with Friends and Family: Three times a week  . Frequency of Social Gatherings with Friends and Family: Twice a week  . Attends Religious Services: 1 to 4 times per year  . Active Member of Clubs or Organizations: No  . Attends Archivist Meetings: Never  . Marital Status: Married     Family History: The patient's family history includes COPD in his father; Cancer in his maternal grandfather; Dementia in his father; Diabetes in his brother; Lung cancer in his mother; Prostate cancer in his father.  ROS:   Please see the history of present illness.     All other systems reviewed and are negative.  EKGs/Labs/Other Studies Reviewed:    The following studies were reviewed today:   EKG:  EKG is  ordered today.  The ekg ordered today demonstrates normal sinus rhythm, possible left atrial enlargement, left axis deviation.  Recent Labs: 01/20/2019: Magnesium 2.2 06/13/2019: ALT 16 09/22/2019: BUN 14; Creatinine, Ser 0.75; Hemoglobin 14.5; Platelets 200; Potassium 4.1; Sodium 137  Recent Lipid Panel    Component Value Date/Time   CHOL 149 06/13/2019 1037   CHOL 212 (H) 03/30/2016 1138   TRIG 74 06/13/2019 1037   HDL 36 (L) 06/13/2019 1037   HDL 25 (L) 03/30/2016 1138   CHOLHDL 4.1 06/13/2019 1037   VLDL 15 06/13/2019 1037   LDLCALC 98 06/13/2019 1037   LDLCALC 144 (H) 12/05/2018 1353    Physical Exam:    VS:  BP 110/68 (BP Location: Left Arm, Patient Position: Sitting, Cuff Size: Large)   Pulse 90   Ht '5\' 9"'$   (1.753 m)   Wt 246 lb 8 oz (111.8 kg)   SpO2 92%   BMI 36.40 kg/m     Wt Readings from Last 3 Encounters:  12/22/19 246 lb 8 oz (111.8 kg)  12/18/19 244 lb 3.2 oz (110.8 kg)  11/04/19 246 lb 3.2 oz (111.7 kg)     GEN:  Well nourished, well developed in no acute distress HEENT: Normal NECK: No JVD; No carotid bruits LYMPHATICS: No lymphadenopathy CARDIAC: RRR, no murmurs, rubs, gallops RESPIRATORY:  expiratory wheezes noted bilaterally. ABDOMEN: Soft, non-tender, non-distended MUSCULOSKELETAL:  No edema; No deformity  SKIN: Warm and dry NEUROLOGIC:  Alert and oriented x 3 PSYCHIATRIC:  Normal affect   ASSESSMENT:   Patient with chest pain, usually with exertion.  Has risk factors of diabetes, 20-year smoking history. 1. Chest pain, unspecified type   2. Smoking   3. Precordial pain   4. Pre-procedure lab exam    PLAN:    In order of problems listed above:  1. Chest pain, description fits angina.  patient has risk factors of hyperlipidemia, diabetes, smoking history, family history of early CAD.  Will get echocardiogram and coronary CT angiogram to evaluate coronary artery disease.  Avoiding Lexiscan due to active wheezing noted on exam. 2. Smoking cessation advised, over 5 minutes spent counseling patient.    This note was generated in part or whole with voice recognition software. Voice recognition is usually quite accurate but there are transcription errors that can and very often do occur. I apologize for any typographical errors that were not detected and corrected.  Medication Adjustments/Labs and Tests Ordered: Current medicines are reviewed at length with the patient today.  Concerns regarding medicines are outlined above.  Orders Placed This Encounter  Procedures  . CT CORONARY MORPH W/CTA COR W/SCORE W/CA W/CM &/OR WO/CM  . CT CORONARY FRACTIONAL FLOW RESERVE DATA PREP  . CT CORONARY FRACTIONAL FLOW RESERVE FLUID ANALYSIS  . Basic metabolic panel  . EKG  12-Lead  . ECHOCARDIOGRAM COMPLETE   Meds ordered this encounter  Medications  . metoprolol tartrate (LOPRESSOR) 100 MG tablet    Sig: Take 1 tablet (100 mg) by mouth x 1 dose 2 hours prior to your Cardiac CT    Dispense:  1 tablet    Refill:  0    Patient Instructions  Medication Instructions:  - Your physician recommends that you continue on your current medications as directed. Please refer to the Current Medication list given to you today.  *If you need a refill on your cardiac medications before your next appointment, please call your pharmacy*  Lab Work: - Your physician recommends that you return for lab work: [facility] (just prior to your Cardiac CT) - [facility] [place] entrance, 1st desk on the right to check in - Lab hours: Monday- Friday (7:30 am- 5:30 pm)   If you have labs (blood work) drawn today and your tests are completely normal, you will receive your results only by: Marland Kitchen MyChart Message (if you have MyChart) OR . A paper copy in the mail If you have any lab test that is abnormal or we need to change your treatment, we will call you to review the results.  Testing/Procedures: - Your physician has requested that you have an echocardiogram. Echocardiography is a painless test that uses sound waves to create images of your heart. It provides your doctor with information about the size and shape of your heart and how well your heart's chambers and valves are working. This procedure takes approximately one hour. There are no restrictions for this procedure.  - Your physician has requested that you have cardiac CT. Cardiac computed tomography (CT) is a painless test that uses an x-ray machine to take clear, detailed pictures of your heart.  Your cardiac CT will be scheduled at one of the below locations:   [facility] [address] [place], [place] [address] (336) [place] [address] [place], [place] [address] [phone]  If scheduled at [facility], please arrive at the  [facility] main entrance of [facility] 30-45 minutes prior to test start time. Proceed to the [facility] Radiology Department (first floor) to check-in and test prep.  If scheduled at [facility], please arrive 15 mins early for check-in and test prep.  Please follow these instructions carefully (unless otherwise directed):  Hold all erectile dysfunction medications at least 3 days (72 hrs) prior to test.  On the Night Before the Test: . Be sure to Drink plenty of water. . Do not consume any caffeinated/decaffeinated beverages or chocolate 12 hours prior to your test. . Do not take any antihistamines 12 hours prior to your test.   On the Day of the Test: . Drink plenty of water. Do not drink any water within one hour of the test. . Do not eat any food 4 hours prior to the test. . You may take your regular medications prior to the test.  . Take metoprolol (Lopressor) 100 mg two hours prior to test. . HOLD Furosemide/Hydrochlorothiazide morning of the test. . FEMALES- please wear underwire-free bra if available       After the Test: . Drink plenty of water. . After receiving IV contrast, you may experience a mild flushed feeling. This is normal. . On occasion, you may experience a mild rash up to 24 hours after the test. This is not dangerous. If this occurs, you can take Benadryl 25 mg and increase your fluid intake. . If you experience trouble breathing, this can be serious. If it is severe call 911 IMMEDIATELY. If it is mild, please call our office. . If you take any of these medications: Glipizide/Metformin, Avandament, Glucavance, please do not take 48 hours after completing test unless otherwise instructed.   Once we have confirmed authorization from your insurance company, we will call you to set up a date and time for your test.   For  non-scheduling related questions, please contact the cardiac imaging nurse navigator should you have any questions/concerns: Marchia Bond, RN Navigator Cardiac Imaging [facility] [phone] Office    Follow-Up: At [facility], you and your health needs are our priority.  As part of our continuing mission to provide you with exceptional heart care, we have created designated Provider Care Teams.  These Care Teams include your primary Cardiologist (physician) and Advanced Practice Providers (APPs -  Physician Assistants and Nurse Practitioners) who all work together to provide you with the care you need, when you need it.  Your next appointment:   6-8 week(s)  The format for your next appointment:   In Person  Provider:   Kate Sable, MD  Other Instructions N/a   Echocardiogram An echocardiogram is a procedure that uses painless sound waves (ultrasound) to produce an image of the heart. Images from an echocardiogram can provide important information about:  Signs of coronary artery disease (CAD).  Aneurysm detection. An aneurysm is a weak or damaged part of an artery wall that bulges out from the normal force of blood pumping through the body.  Heart size and shape. Changes in the size or shape of the heart can be associated with certain conditions, including heart failure, aneurysm, and CAD.  Heart muscle function.  Heart valve function.  Signs of a past heart attack.  Fluid buildup around the heart.  Thickening of the heart muscle.  A tumor or infectious growth around the heart valves. Tell a health care provider about:  Any allergies you have.  All  medicines you are taking, including vitamins, herbs, eye drops, creams, and over-the-counter medicines.  Any blood disorders you have.  Any surgeries you have had.  Any medical conditions you have.  Whether you are pregnant or may be pregnant. What are the risks? Generally,  this is a safe procedure. However, problems may occur, including:  Allergic reaction to dye (contrast) that may be used during the procedure. What happens before the procedure? No specific preparation is needed. You may eat and drink normally. What happens during the procedure?   An IV tube may be inserted into one of your veins.  You may receive contrast through this tube. A contrast is an injection that improves the quality of the pictures from your heart.  A gel will be applied to your chest.  A wand-like tool (transducer) will be moved over your chest. The gel will help to transmit the sound waves from the transducer.  The sound waves will harmlessly bounce off of your heart to allow the heart images to be captured in real-time motion. The images will be recorded on a computer. The procedure may vary among health care providers and hospitals. What happens after the procedure?  You may return to your normal, everyday life, including diet, activities, and medicines, unless your health care provider tells you not to do that. Summary  An echocardiogram is a procedure that uses painless sound waves (ultrasound) to produce an image of the heart.  Images from an echocardiogram can provide important information about the size and shape of your heart, heart muscle function, heart valve function, and fluid buildup around your heart.  You do not need to do anything to prepare before this procedure. You may eat and drink normally.  After the echocardiogram is completed, you may return to your normal, everyday life, unless your health care provider tells you not to do that. This information is not intended to replace advice given to you by your health care provider. Make sure you discuss any questions you have with your health care provider. Document Revised: 02/27/2019 Document Reviewed: 12/09/2016 Elsevier Patient Education  [place].   Cardiac CT Angiogram A cardiac CT  angiogram is a procedure to look at the heart and the area around the heart. It may be done to help find the cause of chest pains or other symptoms of heart disease. During this procedure, a substance called contrast dye is injected into the blood vessels in the area to be checked. A large X-ray machine, called a CT scanner, then takes detailed pictures of the heart and the surrounding area. The procedure is also sometimes called a coronary CT angiogram, coronary artery scanning, or CTA. A cardiac CT angiogram allows the health care provider to see how well blood is flowing to and from the heart. The health care provider will be able to see if there are any problems, such as:  Blockage or narrowing of the coronary arteries in the heart.  Fluid around the heart.  Signs of weakness or disease in the muscles, valves, and tissues of the heart. Tell a health care provider about:  Any allergies you have. This is especially important if you have had a previous allergic reaction to contrast dye.  All medicines you are taking, including vitamins, herbs, eye drops, creams, and over-the-counter medicines.  Any blood disorders you have.  Any surgeries you have had.  Any medical conditions you have.  Whether you are pregnant or may be pregnant.  Any anxiety disorders, chronic  pain, or other conditions you have that may increase your stress or prevent you from lying still. What are the risks? Generally, this is a safe procedure. However, problems may occur, including:  Bleeding.  Infection.  Allergic reactions to medicines or dyes.  Damage to other structures or organs.  Kidney damage from the contrast dye that is used.  Increased risk of cancer from radiation exposure. This risk is low. Talk with your health care provider about: ? The risks and benefits of testing. ? How you can receive the lowest dose of radiation. What happens before the procedure?  Wear comfortable clothing and remove  any jewelry, glasses, dentures, and hearing aids.  Follow instructions from your health care provider about eating and drinking. This may include: ? For 12 hours before the procedure -- avoid caffeine. This includes tea, coffee, soda, energy drinks, and diet pills. Drink plenty of water or other fluids that do not have caffeine in them. Being well hydrated can prevent complications. ? For 4-6 hours before the procedure -- stop eating and drinking. The contrast dye can cause nausea, but this is less likely if your stomach is empty.  Ask your health care provider about changing or stopping your regular medicines. This is especially important if you are taking diabetes medicines, blood thinners, or medicines to treat problems with erections (erectile dysfunction). What happens during the procedure?   Hair on your chest may need to be removed so that small sticky patches called electrodes can be placed on your chest. These will transmit information that helps to monitor your heart during the procedure.  An IV will be inserted into one of your veins.  You might be given a medicine to control your heart rate during the procedure. This will help to ensure that good images are obtained.  You will be asked to lie on an exam table. This table will slide in and out of the CT machine during the procedure.  Contrast dye will be injected into the IV. You might feel warm, or you may get a metallic taste in your mouth.  You will be given a medicine called nitroglycerin. This will relax or dilate the arteries in your heart.  The table that you are lying on will move into the CT machine tunnel for the scan.  The person running the machine will give you instructions while the scans are being done. You may be asked to: ? Keep your arms above your head. ? Hold your breath. ? Stay very still, even if the table is moving.  When the scanning is complete, you will be moved out of the machine.  The IV will be  removed. The procedure may vary among health care providers and hospitals. What can I expect after the procedure? After your procedure, it is common to have:  A metallic taste in your mouth from the contrast dye.  A feeling of warmth.  A headache from the nitroglycerin. Follow these instructions at home:  Take over-the-counter and prescription medicines only as told by your health care provider.  If you are told, drink enough fluid to keep your urine pale yellow. This will help to flush the contrast dye out of your body.  Most people can return to their normal activities right after the procedure. Ask your health care provider what activities are safe for you.  It is up to you to get the results of your procedure. Ask your health care provider, or the department that is doing the procedure,  when your results will be ready.  Keep all follow-up visits as told by your health care provider. This is important. Contact a health care provider if:  You have any symptoms of allergy to the contrast dye. These include: ? Shortness of breath. ? Rash or hives. ? A racing heartbeat. Summary  A cardiac CT angiogram is a procedure to look at the heart and the area around the heart. It may be done to help find the cause of chest pains or other symptoms of heart disease.  During this procedure, a large X-ray machine, called a CT scanner, takes detailed pictures of the heart and the surrounding area after a contrast dye has been injected into blood vessels in the area.  Ask your health care provider about changing or stopping your regular medicines before the procedure. This is especially important if you are taking diabetes medicines, blood thinners, or medicines to treat erectile dysfunction.  If you are told, drink enough fluid to keep your urine pale yellow. This will help to flush the contrast dye out of your body. This information is not intended to replace advice given to you by your health  care provider. Make sure you discuss any questions you have with your health care provider. Document Revised: 07/02/2019 Document Reviewed: 07/02/2019 Elsevier Patient Education  2020 [place], Kate Sable, MD  12/22/2019 12:23 PM    [place]

## 2019-12-22 NOTE — Progress Notes (Signed)
Thank you so much for seeing this patient for your insight.

## 2019-12-22 NOTE — Patient Instructions (Addendum)
Medication Instructions:  - Your physician recommends that you continue on your current medications as directed. Please refer to the Current Medication list given to you today.  *If you need a refill on your cardiac medications before your next appointment, please call your pharmacy*  Lab Work: - Your physician recommends that you return for lab work: BMP (just prior to your Cardiac CT) - [place] entrance, 1st desk on the right to check in - Lab hours: Monday- Friday (7:30 am- 5:30 pm)   If you have labs (blood work) drawn today and your tests are completely normal, you will receive your results only by: Marland Kitchen MyChart Message (if you have MyChart) OR . A paper copy in the mail If you have any lab test that is abnormal or we need to change your treatment, we will call you to review the results.  Testing/Procedures: - Your physician has requested that you have an echocardiogram. Echocardiography is a painless test that uses sound waves to create images of your heart. It provides your doctor with information about the size and shape of your heart and how well your heart's chambers and valves are working. This procedure takes approximately one hour. There are no restrictions for this procedure.  - Your physician has requested that you have cardiac CT. Cardiac computed tomography (CT) is a painless test that uses an x-ray machine to take clear, detailed pictures of your heart.  Your cardiac CT will be scheduled at one of the below locations:   [facility]: [address] [place], [place] [address] ([phone] [place] [address] [place], [place] [address] [phone]  If scheduled at [facility], please arrive at the [facility] main entrance of [facility] 30-45 minutes prior to test start time. Proceed to the [facility] Radiology Department (first floor) to check-in and test prep.  If scheduled  at [facility], please arrive 15 mins early for check-in and test prep.  Please follow these instructions carefully (unless otherwise directed):  Hold all erectile dysfunction medications at least 3 days (72 hrs) prior to test.  On the Night Before the Test: . Be sure to Drink plenty of water. . Do not consume any caffeinated/decaffeinated beverages or chocolate 12 hours prior to your test. . Do not take any antihistamines 12 hours prior to your test.   On the Day of the Test: . Drink plenty of water. Do not drink any water within one hour of the test. . Do not eat any food 4 hours prior to the test. . You may take your regular medications prior to the test.  . Take metoprolol (Lopressor) 100 mg two hours prior to test. . HOLD Furosemide/Hydrochlorothiazide morning of the test. . FEMALES- please wear underwire-free bra if available       After the Test: . Drink plenty of water. . After receiving IV contrast, you may experience a mild flushed feeling. This is normal. . On occasion, you may experience a mild rash up to 24 hours after the test. This is not dangerous. If this occurs, you can take Benadryl 25 mg and increase your fluid intake. . If you experience trouble breathing, this can be serious. If it is severe call 911 IMMEDIATELY. If it is mild, please call our office. . If you take any of these medications: Glipizide/Metformin, Avandament, Glucavance, please do not take 48 hours after completing test unless otherwise instructed.  Once we have confirmed authorization from your insurance company, we will call you to set up a date and time for your test.   For non-scheduling related questions, please contact the cardiac imaging nurse navigator should you have any questions/concerns: Marchia Bond, RN Navigator Cardiac Imaging [facility] [phone] Office    Follow-Up: At [facility], you and your health needs are our  priority.  As part of our continuing mission to provide you with exceptional heart care, we have created designated Provider Care Teams.  These Care Teams include your primary Cardiologist (physician) and Advanced Practice Providers (APPs -  Physician Assistants and Nurse Practitioners) who all work together to provide you with the care you need, when you need it.  Your next appointment:   6-8 week(s)  The format for your next appointment:   In Person  Provider:   Kate Sable, MD  Other Instructions N/a   Echocardiogram An echocardiogram is a procedure that uses painless sound waves (ultrasound) to produce an image of the heart. Images from an echocardiogram can provide important information about:  Signs of coronary artery disease (CAD).  Aneurysm detection. An aneurysm is a weak or damaged part of an artery wall that bulges out from the normal force of blood pumping through the body.  Heart size and shape. Changes in the size or shape of the heart can be associated with certain conditions, including heart failure, aneurysm, and CAD.  Heart muscle function.  Heart valve function.  Signs of a past heart attack.  Fluid buildup around the heart.  Thickening of the heart muscle.  A tumor or infectious growth around the heart valves. Tell a health care provider about:  Any allergies you have.  All medicines you are taking, including vitamins, herbs, eye drops, creams, and over-the-counter medicines.  Any blood disorders you have.  Any surgeries you have had.  Any medical conditions you have.  Whether you are pregnant or may be pregnant. What are the risks? Generally, this is a safe procedure. However, problems may occur, including:  Allergic reaction to dye (contrast) that may be used during the procedure. What happens before the procedure? No specific preparation is needed. You may eat and drink normally. What happens during the procedure?   An IV tube may  be inserted into one of your veins.  You may receive contrast through this tube. A contrast is an injection that improves the quality of the pictures from your heart.  A gel will be applied to your chest.  A wand-like tool (transducer) will be moved over your chest. The gel will help to transmit the sound waves from the transducer.  The sound waves will harmlessly bounce off of your heart to allow the heart images to be captured in real-time motion. The images will be recorded on a computer. The procedure may vary among health care providers and hospitals. What happens after the procedure?  You may return to your normal, everyday life, including diet, activities, and medicines, unless your health care provider tells you not to do that. Summary  An echocardiogram is a procedure that uses painless sound waves (ultrasound) to produce an image of the heart.  Images from an echocardiogram can provide important information about the size and shape of your heart, heart muscle function, heart valve function, and fluid buildup around your heart.  You do not need to do anything to prepare before this procedure. You may eat and drink normally.  After the echocardiogram  is completed, you may return to your normal, everyday life, unless your health care provider tells you not to do that. This information is not intended to replace advice given to you by your health care provider. Make sure you discuss any questions you have with your health care provider. Document Revised: 02/27/2019 Document Reviewed: 12/09/2016 Elsevier Patient Education  [place].   Cardiac CT Angiogram A cardiac CT angiogram is a procedure to look at the heart and the area around the heart. It may be done to help find the cause of chest pains or other symptoms of heart disease. During this procedure, a substance called contrast dye is injected into the blood vessels in the area to be checked. A large X-ray machine,  called a CT scanner, then takes detailed pictures of the heart and the surrounding area. The procedure is also sometimes called a coronary CT angiogram, coronary artery scanning, or CTA. A cardiac CT angiogram allows the health care provider to see how well blood is flowing to and from the heart. The health care provider will be able to see if there are any problems, such as:  Blockage or narrowing of the coronary arteries in the heart.  Fluid around the heart.  Signs of weakness or disease in the muscles, valves, and tissues of the heart. Tell a health care provider about:  Any allergies you have. This is especially important if you have had a previous allergic reaction to contrast dye.  All medicines you are taking, including vitamins, herbs, eye drops, creams, and over-the-counter medicines.  Any blood disorders you have.  Any surgeries you have had.  Any medical conditions you have.  Whether you are pregnant or may be pregnant.  Any anxiety disorders, chronic pain, or other conditions you have that may increase your stress or prevent you from lying still. What are the risks? Generally, this is a safe procedure. However, problems may occur, including:  Bleeding.  Infection.  Allergic reactions to medicines or dyes.  Damage to other structures or organs.  Kidney damage from the contrast dye that is used.  Increased risk of cancer from radiation exposure. This risk is low. Talk with your health care provider about: ? The risks and benefits of testing. ? How you can receive the lowest dose of radiation. What happens before the procedure?  Wear comfortable clothing and remove any jewelry, glasses, dentures, and hearing aids.  Follow instructions from your health care provider about eating and drinking. This may include: ? For 12 hours before the procedure -- avoid caffeine. This includes tea, coffee, soda, energy drinks, and diet pills. Drink plenty of water or other fluids  that do not have caffeine in them. Being well hydrated can prevent complications. ? For 4-6 hours before the procedure -- stop eating and drinking. The contrast dye can cause nausea, but this is less likely if your stomach is empty.  Ask your health care provider about changing or stopping your regular medicines. This is especially important if you are taking diabetes medicines, blood thinners, or medicines to treat problems with erections (erectile dysfunction). What happens during the procedure?   Hair on your chest may need to be removed so that small sticky patches called electrodes can be placed on your chest. These will transmit information that helps to monitor your heart during the procedure.  An IV will be inserted into one of your veins.  You might be given a medicine to control your heart rate during the procedure.  This will help to ensure that good images are obtained.  You will be asked to lie on an exam table. This table will slide in and out of the CT machine during the procedure.  Contrast dye will be injected into the IV. You might feel warm, or you may get a metallic taste in your mouth.  You will be given a medicine called nitroglycerin. This will relax or dilate the arteries in your heart.  The table that you are lying on will move into the CT machine tunnel for the scan.  The person running the machine will give you instructions while the scans are being done. You may be asked to: ? Keep your arms above your head. ? Hold your breath. ? Stay very still, even if the table is moving.  When the scanning is complete, you will be moved out of the machine.  The IV will be removed. The procedure may vary among health care providers and hospitals. What can I expect after the procedure? After your procedure, it is common to have:  A metallic taste in your mouth from the contrast dye.  A feeling of warmth.  A headache from the nitroglycerin. Follow these instructions  at home:  Take over-the-counter and prescription medicines only as told by your health care provider.  If you are told, drink enough fluid to keep your urine pale yellow. This will help to flush the contrast dye out of your body.  Most people can return to their normal activities right after the procedure. Ask your health care provider what activities are safe for you.  It is up to you to get the results of your procedure. Ask your health care provider, or the department that is doing the procedure, when your results will be ready.  Keep all follow-up visits as told by your health care provider. This is important. Contact a health care provider if:  You have any symptoms of allergy to the contrast dye. These include: ? Shortness of breath. ? Rash or hives. ? A racing heartbeat. Summary  A cardiac CT angiogram is a procedure to look at the heart and the area around the heart. It may be done to help find the cause of chest pains or other symptoms of heart disease.  During this procedure, a large X-ray machine, called a CT scanner, takes detailed pictures of the heart and the surrounding area after a contrast dye has been injected into blood vessels in the area.  Ask your health care provider about changing or stopping your regular medicines before the procedure. This is especially important if you are taking diabetes medicines, blood thinners, or medicines to treat erectile dysfunction.  If you are told, drink enough fluid to keep your urine pale yellow. This will help to flush the contrast dye out of your body. This information is not intended to replace advice given to you by your health care provider. Make sure you discuss any questions you have with your health care provider. Document Revised: 07/02/2019 Document Reviewed: 07/02/2019 Elsevier Patient Education  [place].

## 2019-12-23 ENCOUNTER — Ambulatory Visit: Payer: No Typology Code available for payment source

## 2019-12-25 ENCOUNTER — Telehealth: Payer: Self-pay | Admitting: Cardiology

## 2019-12-25 NOTE — Telephone Encounter (Signed)
Patient calling with updated insurance information - [employer] added but states he still has [employer] States he needed to update before CT could be reviewed and scheduled  Please call when ready

## 2019-12-25 NOTE — Telephone Encounter (Addendum)
Update (staff message) sent  to prior auth dept and Earvin Hansen.

## 2019-12-31 NOTE — Telephone Encounter (Signed)
[place] calling in regarding procedure with [facility] [phone] CT Angio  This procedure is approved [id number] as of 12/30/19-03/29/20  A faxed approval letter will be faxed as well

## 2019-12-31 NOTE — Telephone Encounter (Signed)
Staff message with info sent to Howie Ill and precert to follow up.

## 2020-01-08 ENCOUNTER — Emergency Department
Admission: EM | Admit: 2020-01-08 | Discharge: 2020-01-08 | Disposition: A | Discharge status: Home / Self Care | Payer: No Typology Code available for payment source | Attending: Emergency Medicine | Admitting: Emergency Medicine

## 2020-01-08 ENCOUNTER — Emergency Department: Payer: No Typology Code available for payment source

## 2020-01-08 ENCOUNTER — Other Ambulatory Visit: Payer: Self-pay

## 2020-01-08 ENCOUNTER — Telehealth: Payer: Self-pay | Admitting: Cardiology

## 2020-01-08 ENCOUNTER — Encounter: Payer: Self-pay | Admitting: Emergency Medicine

## 2020-01-08 DIAGNOSIS — R0789 Other chest pain: Secondary | ICD-10-CM | POA: Diagnosis present

## 2020-01-08 DIAGNOSIS — R61 Generalized hyperhidrosis: Secondary | ICD-10-CM | POA: Insufficient documentation

## 2020-01-08 DIAGNOSIS — Z79899 Other long term (current) drug therapy: Secondary | ICD-10-CM | POA: Diagnosis not present

## 2020-01-08 DIAGNOSIS — Z7984 Long term (current) use of oral hypoglycemic drugs: Secondary | ICD-10-CM | POA: Diagnosis not present

## 2020-01-08 DIAGNOSIS — R0602 Shortness of breath: Secondary | ICD-10-CM | POA: Insufficient documentation

## 2020-01-08 DIAGNOSIS — E119 Type 2 diabetes mellitus without complications: Secondary | ICD-10-CM | POA: Insufficient documentation

## 2020-01-08 DIAGNOSIS — Z7982 Long term (current) use of aspirin: Secondary | ICD-10-CM | POA: Insufficient documentation

## 2020-01-08 DIAGNOSIS — Z8616 Personal history of COVID-19: Secondary | ICD-10-CM | POA: Diagnosis not present

## 2020-01-08 DIAGNOSIS — F17210 Nicotine dependence, cigarettes, uncomplicated: Secondary | ICD-10-CM | POA: Diagnosis not present

## 2020-01-08 DIAGNOSIS — R000 Tachycardia, unspecified: Secondary | ICD-10-CM | POA: Diagnosis not present

## 2020-01-08 DIAGNOSIS — I2510 Atherosclerotic heart disease of native coronary artery without angina pectoris: Secondary | ICD-10-CM | POA: Diagnosis not present

## 2020-01-08 DIAGNOSIS — J45909 Unspecified asthma, uncomplicated: Secondary | ICD-10-CM | POA: Diagnosis not present

## 2020-01-08 DIAGNOSIS — R030 Elevated blood-pressure reading, without diagnosis of hypertension: Secondary | ICD-10-CM | POA: Diagnosis not present

## 2020-01-08 DIAGNOSIS — R05 Cough: Secondary | ICD-10-CM | POA: Insufficient documentation

## 2020-01-08 HISTORY — DX: COVID-19: U07.1

## 2020-01-08 HISTORY — DX: Unspecified asthma, uncomplicated: J45.909

## 2020-01-08 HISTORY — DX: Chondrocostal junction syndrome (tietze): M94.0

## 2020-01-08 LAB — LIPASE, BLOOD: Lipase: 34 U/L (ref 11–51)

## 2020-01-08 LAB — CBC WITH DIFFERENTIAL/PLATELET
Abs Immature Granulocytes: 0.05 10*3/uL (ref 0.00–0.07)
Basophils Absolute: 0.1 10*3/uL (ref 0.0–0.1)
Basophils Relative: 0 %
Eosinophils Absolute: 0.1 10*3/uL (ref 0.0–0.5)
Eosinophils Relative: 1 %
HCT: 43.2 % (ref 39.0–52.0)
Hemoglobin: 13.8 g/dL (ref 13.0–17.0)
Immature Granulocytes: 0 %
Lymphocytes Relative: 17 %
Lymphs Abs: 2.3 10*3/uL (ref 0.7–4.0)
MCH: 27.7 pg (ref 26.0–34.0)
MCHC: 31.9 g/dL (ref 30.0–36.0)
MCV: 86.7 fL (ref 80.0–100.0)
Monocytes Absolute: 0.9 10*3/uL (ref 0.1–1.0)
Monocytes Relative: 6 %
Neutro Abs: 10.5 10*3/uL — ABNORMAL HIGH (ref 1.7–7.7)
Neutrophils Relative %: 76 %
Platelets: 196 10*3/uL (ref 150–400)
RBC: 4.98 MIL/uL (ref 4.22–5.81)
RDW: 15.1 % (ref 11.5–15.5)
WBC: 13.9 10*3/uL — ABNORMAL HIGH (ref 4.0–10.5)
nRBC: 0 % (ref 0.0–0.2)

## 2020-01-08 LAB — COMPREHENSIVE METABOLIC PANEL
ALT: 13 U/L (ref 0–44)
AST: 14 U/L — ABNORMAL LOW (ref 15–41)
Albumin: 3.5 g/dL (ref 3.5–5.0)
Alkaline Phosphatase: 89 U/L (ref 38–126)
Anion gap: 4 — ABNORMAL LOW (ref 5–15)
BUN: 13 mg/dL (ref 6–20)
CO2: 30 mmol/L (ref 22–32)
Calcium: 8.7 mg/dL — ABNORMAL LOW (ref 8.9–10.3)
Chloride: 102 mmol/L (ref 98–111)
Creatinine, Ser: 0.73 mg/dL (ref 0.61–1.24)
GFR calc Af Amer: >60 mL/min (ref >60)
GFR calc non Af Amer: >60 mL/min (ref >60)
Glucose, Bld: 357 mg/dL — ABNORMAL HIGH (ref 70–99)
Potassium: 4.3 mmol/L (ref 3.5–5.1)
Sodium: 136 mmol/L (ref 135–145)
Total Bilirubin: 0.4 mg/dL (ref 0.3–1.2)
Total Protein: 6.8 g/dL (ref 6.5–8.1)

## 2020-01-08 LAB — MAGNESIUM: Magnesium: 1.9 mg/dL (ref 1.7–2.4)

## 2020-01-08 LAB — TROPONIN I (HIGH SENSITIVITY)
Troponin I (High Sensitivity): 4 ng/L (ref <18)
Troponin I (High Sensitivity): 4 ng/L (ref <18)

## 2020-01-08 MED ORDER — ASPIRIN 81 MG PO CHEW
243.0000 mg | CHEWABLE_TABLET | Freq: Once | ORAL | Status: AC
  Administered 2020-01-08: 243 mg via ORAL
  Filled 2020-01-08: qty 3

## 2020-01-08 NOTE — Discharge Instructions (Signed)
You have been seen in the Emergency Department (ED) today for chest pain.  As we have discussed today's test results are normal, but you may require further testing.  Please follow up with the recommended doctor as instructed above in these documents regarding today's emergent visit and your recent symptoms to discuss further management.  Continue to take your regular medications. If you are not doing so already, please also take a daily baby aspirin (81 mg), at least until you follow up with your doctor.  Use your prescribed nitroglycerin as needed if you have recurrent episodes.  Return to the Emergency Department (ED) if you exper multiple ience any further chest pain/pressure/tightness, difficulty breathing, or sudden sweating, or other symptoms that concern you.

## 2020-01-08 NOTE — ED Provider Notes (Signed)
[facility] Emergency Department Provider Note  ____________________________________________   First MD Initiated Contact with Patient 01/08/20 [phone]     (approximate)  I have reviewed the triage vital signs and the nursing notes.   HISTORY  Chief Complaint Chest Pain    HPI Joel Page is a 49 y.o. male with medical history as listed below who presents for evaluation of acute onset chest pressure at home.  He said he was getting ready for bed (he is on a unusual work schedule) and the symptoms started acutely after he got mad at his stepdaughter.  He said the symptoms were severe and felt like a heavy pressure on his chest.  He has nitroglycerin prescribed by his pulmonologist but he is never used it before but when he took one the pressure resolved and he is currently asymptomatic.  He said that he took his blood pressure at home and it was "in the heart attack or stroke range", and he showed me that his pressure on the home meter was 138/117.  His blood pressure currently is 116/72.  He said that he never had issues prior to his diagnosis of COVID-19 which was almost exactly 1 year ago.  He said that since then he has had shortness of breath and occasional chest pain including chest wall pain and that he can feel something pop when he coughs in his chest wall which has been going on for months.  He also claims that he has coughed hard enough that he broke 3 ribs.  He is a patient of Dr. Patsey Berthold with pulmonary medicine and is being evaluated for possible COPD and he reports that she has told him that he has asthma.  He has a follow-up appointment scheduled with cardiology and for an echocardiogram within the next few weeks as well as a CT scan of his chest within the next few weeks.  The onset of the symptoms was acute, the pressure was severe and accompanied with diaphoresis but no shortness of breath and no nausea or vomiting.  Nitroglycerin made the  pressure go away and nothing in particular made it worse except possibly the emotional upset he had at home with his stepdaughter.        Past Medical History:  Diagnosis Date  . Allergy   . Asthma   . COPD (chronic obstructive pulmonary disease) ([place]) 12/2019   probable, not officially diagnosed, but being evaluated by pulmonology  . Coronary artery disease   . Costochondritis    history of multiple episodes  . COVID-19    patient reports having COVID-19 in February 2020  . DDD (degenerative disc disease)   . Diabetes mellitus   . Insomnia   . Lumbar stenosis   . 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 History of leukocytosis 09/30/2019  . Multiple closed fractures of ribs of right side 06/30/2019  . Esophageal dysphagia 06/30/2019  . Cervical stenosis of spinal canal 08/15/2016  . Hyperlipidemia LDL goal <100 07/04/2016  . Obesity 06/29/2016  . Back pain at L4-L5 level 04/07/2016  . Cough 03/30/2016  . Medication monitoring encounter 03/30/2016  . COPD suggested by initial evaluation ([place]) 12/22/2015  . 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  . Diabetes mellitus type 2, uncontrolled ([place]) 05/11/2015  . Current smoker 05/11/2015  . Hemorrhoids, internal 05/11/2015  . Arthralgia of hip 05/11/2015  . Degenerative  joint disease (DJD) of lumbar spine 05/11/2015  . Restless leg 05/11/2015  . Trochanteric bursitis of right hip 06/17/2013  . Sciatica 03/19/2012  . Lumbar degenerative disc disease 03/19/2012    Past Surgical History:  Procedure Laterality Date  . EXCISIONAL HEMORRHOIDECTOMY  2012  . ORIF FEMUR FRACTURE  1989  . SPHINCTEROTOMY    . TOOTH EXTRACTION      Prior to Admission medications   Medication Sig Start Date End Date Taking? Authorizing Provider  albuterol (VENTOLIN HFA) 108 (90 Base) MCG/ACT inhaler Inhale 2-4 puffs by mouth every 4 hours as needed for  wheezing, cough, and/or shortness of breath 09/02/19   Hubbard Hartshorn, FNP  aspirin 81 MG EC tablet Take 81 mg by mouth daily. Swallow whole.    [provider]  atorvastatin (LIPITOR) 20 MG tablet Take 1 tablet (20 mg total) by mouth at bedtime. 06/13/19   Hubbard Hartshorn, FNP  Blood Glucose Monitoring Suppl (BAYER CONTOUR NEXT MONITOR) w/Device KIT 1 Device by Does not apply route 1 day or 1 dose. DX: DM E11.5 12/22/15   Bobetta Lime, MD  Cinnamon 500 MG capsule Take 1,000 mg by mouth daily.    [provider]  Echinacea 400 MG CAPS Take 2 capsules by mouth daily.     [provider]  empagliflozin (JARDIANCE) 10 MG TABS tablet Take 10 mg by mouth daily before breakfast. Patient not taking: Reported on 12/22/2019 07/18/19   Hubbard Hartshorn, FNP  Fluticasone-Umeclidin-Vilant (TRELEGY ELLIPTA) 100-62.5-25 MCG/INH AEPB Inhale 1 puff into the lungs daily. Patient taking differently: Inhale 1 puff into the lungs daily. occassionally 12/18/19   Tyler Pita, MD  Garlic 10 MG CAPS Take by mouth daily.     [provider]  metFORMIN (GLUCOPHAGE-XR) 500 MG 24 hr tablet Take 2 tablets once daily 09/30/19   Hubbard Hartshorn, FNP  metoprolol tartrate (LOPRESSOR) 100 MG tablet Take 1 tablet (100 mg) by mouth x 1 dose 2 hours prior to your Cardiac CT 12/22/19   Kate Sable, MD  nitroGLYCERIN (NITROSTAT) 0.4 MG SL tablet Place 1 tablet (0.4 mg total) under the tongue every 5 (five) minutes as needed for chest pain. 12/18/19   Tyler Pita, MD  Omega-3 Fatty Acids (FISH OIL) 1000 MG CAPS Take by mouth daily.     [provider]  ZINC-MAGNESIUM ASPART-VIT B6 PO Take by mouth daily.     [provider]    Allergies Topiramate, Trazodone, Baclofen, and Diclofenac sodium  Family History  Problem Relation Age of Onset  . Lung cancer Mother   . Dementia Father   . COPD Father   . Prostate cancer Father   . Diabetes Brother   . Cancer Maternal  Grandfather     Social History Social History   Tobacco Use  . Smoking status: Current Some Day Smoker    Packs/day: 2.00    Types: Cigarettes  . Smokeless tobacco: Current User  . Tobacco comment: 0.5 currently- 11/04/2019  Substance Use Topics  . Alcohol use: Yes    Alcohol/week: 0.0 standard drinks  . Drug use: Never    Review of Systems Constitutional: No fever/chills Eyes: No visual changes. ENT: No sore throat. Cardiovascular: Denies chest pain. Respiratory: + Chest pressure Gastrointestinal: No abdominal pain.  No nausea, no vomiting.  No diarrhea.  No constipation. Genitourinary: Negative for dysuria. Musculoskeletal: Negative for neck pain.  Negative for back pain. Integumentary: Negative for rash.  Mild diaphoresis. Neurological: Negative  for headaches, focal weakness or numbness.   ____________________________________________   PHYSICAL EXAM:  VITAL SIGNS: ED Triage Vitals  Enc Vitals Group     BP 01/08/20 0546 119/75     Pulse Rate 01/08/20 0546 98     Resp 01/08/20 0547 10     Temp 01/08/20 0547 99 F (37.2 C)     Temp Source 01/08/20 0547 Oral     SpO2 01/08/20 0546 95 %     Weight 01/08/20 0539 108.9 kg (240 lb)     Height 01/08/20 0539 1.753 m (5' 9" )     Head Circumference --      Peak Flow --      Pain Score 01/08/20 0539 0     Pain Loc --      Pain Edu? --      Excl. in [place]? --     Constitutional: Alert and oriented.  Eyes: Conjunctivae are normal.  Head: Atraumatic. Nose: No congestion/rhinnorhea. Mouth/Throat: Patient is wearing a mask. Neck: No stridor.  No meningeal signs.   Cardiovascular: Normal rate, regular rhythm. Good peripheral circulation. Grossly normal heart sounds. Respiratory: Normal respiratory effort.  No retractions. Gastrointestinal: Soft and nontender. No distention.  Musculoskeletal: No lower extremity tenderness nor edema. No gross deformities of extremities. Neurologic:  Normal speech and language. No gross  focal neurologic deficits are appreciated.  Skin:  Skin is warm, dry and intact. Psychiatric: Mood and affect are somewhat anxious but generally appropriate under the circumstances.  ____________________________________________   LABS (all labs ordered are listed, but only abnormal results are displayed)  Labs Reviewed  CBC WITH DIFFERENTIAL/PLATELET - Abnormal; Notable for the following components:      Result Value   WBC 13.9 (*)    Neutro Abs 10.5 (*)    All other components within normal limits  COMPREHENSIVE METABOLIC PANEL - Abnormal; Notable for the following components:   Glucose, Bld 357 (*)    Calcium 8.7 (*)    AST 14 (*)    Anion gap 4 (*)    All other components within normal limits  LIPASE, BLOOD  MAGNESIUM  TROPONIN I (HIGH SENSITIVITY)   ____________________________________________  EKG  ED ECG REPORT I, Hinda Kehr, the attending physician, personally viewed and interpreted this ECG.  Date: 01/08/2020 EKG Time: 5:46 AM Rate: 98 Rhythm: normal sinus rhythm QRS Axis: Left axis deviation Intervals: normal ST/T Wave abnormalities: normal Narrative Interpretation: no evidence of acute ischemia  ____________________________________________  RADIOLOGY I, Hinda Kehr, personally viewed and evaluated these images (plain radiographs) as part of my medical decision making, as well as reviewing the written report by the radiologist.  ED MD interpretation:  No acute abnormality  Official radiology report(s): DG Chest Portable 1 View  Result Date: 01/08/2020 CLINICAL DATA:  Chest pain and pressure EXAM: PORTABLE CHEST 1 VIEW COMPARISON:  09/22/2019 FINDINGS: Interstitial prominence that is stable. Normal heart size and mediastinal contours. There is no edema, consolidation, effusion, or pneumothorax. IMPRESSION: Stable.  No evidence of acute disease. Electronically Signed   By: Monte Fantasia M.D.   On: 01/08/2020 06:54     ____________________________________________   PROCEDURES   Procedure(s) performed (including Critical Care):  Procedures   ____________________________________________   INITIAL IMPRESSION / MDM / [place] / ED COURSE  As part of my medical decision making, I reviewed the following data within the [place] notes reviewed and incorporated, Labs reviewed , EKG interpreted , Old chart reviewed, Radiograph reviewed  and Notes from prior ED visits   Differential diagnosis includes, but is not limited to, angina, ACS including unstable angina, musculoskeletal pain, costochondritis, pneumonia, pneumothorax, PE, AAS, COPD.  The patient's vital signs are stable and he has a reassuring blood pressure and very mild tachycardia which I think is likely due to his anxiety over his symptoms.  He has been prescribed nitroglycerin in the past specifically because of his episodes of chest pain and pressure and he has cardiology consultation is pending.  I believe that the emotional upset likely set off his episode tonight.  EKG is reassuring with no sign of ischemia.  Our current plan is to check lab work and if the first troponin is within normal limits we will check a second troponin.HEAR score is elevated at 5 due to his risk factors, but he also has close follow up available to him.  We will continue to monitor and reassess once his troponin results are back and after we determine if he has repeat episodes of discomfort.  Giving ASA 243 mg PO (he took a baby ASA at home).      Clinical Course as of Jan 08 716  Thu Jan 08, 2020  1610 Normal troponin, repeat to be drawn at 8:00am.  Updated patient.  He asked if we could speed it up so that he can get home which I take as an encouraging sign, but I explained we need to time the lab appropriately.  He is comfortable with the current plan.  I transferred ED care to Dr. Jacqualine Code.   [CF]    Clinical Course User  Index [CF] Hinda Kehr, MD     ____________________________________________  FINAL CLINICAL IMPRESSION(S) / ED DIAGNOSES  Final diagnoses:  Chest pressure     MEDICATIONS GIVEN DURING THIS VISIT:  Medications  aspirin chewable tablet 243 mg (243 mg Oral Given 01/08/20 9604)     ED Discharge Orders    None      *Please note:  Coulton Schlink was evaluated in Emergency Department on 01/08/2020 for the symptoms described in the history of present illness. He was evaluated in the context of the global COVID-19 pandemic, which necessitated consideration that the patient might be at risk for infection with the SARS-CoV-2 virus that causes COVID-19. Institutional protocols and algorithms that pertain to the evaluation of patients at risk for COVID-19 are in a state of rapid change based on information released by regulatory bodies including the CDC and federal and state organizations. These policies and algorithms were followed during the patient's care in the ED.  Some ED evaluations and interventions may be delayed as a result of limited staffing during the pandemic.*  Note:  This document was prepared using Dragon voice recognition software and may include unintentional dictation errors.   Hinda Kehr, MD 01/08/20 [phone]

## 2020-01-08 NOTE — Telephone Encounter (Signed)
Attempted to call the patient. No answer- I left a message on the patient's voice mail (ok per DPR), that his BMP order is already in and he may stop by the [place], 1st desk on the right, past the screening table on 2/24 to have his labs done.   I asked that he call back with any further questions/ concerns.

## 2020-01-08 NOTE — ED Provider Notes (Signed)
Vitals:   01/08/20 0658 01/08/20 0736  BP: 109/65   Pulse: 87 79  Resp:  14  Temp:    SpO2: 96% 96%    Patient's repeat troponin is normal.  Patient resting comfortably.  He is very pleasant. Ambulates without difficulty. NAD. Normal respirations. Discussed follow-up plan with him, he is comfortable with this as well as return precautions.  Is a interesting story which he relates of the symptoms all starting about a year ago after he was diagnosed as Covid in roughly February 2020.  Encouraged patient to follow-up with primary as well as cardiology.  Return precautions and treatment recommendations and follow-up discussed with the patient who is agreeable with the plan.    Delman Kitten, MD 01/08/20 [phone]

## 2020-01-08 NOTE — Telephone Encounter (Signed)
Keane Police, RN; Lorenza Evangelist, RN  Patient scheduled for March 11,2021 130pm ,patient will need to have blood work prior to test, [facility] would like to have done this day.

## 2020-01-08 NOTE — ED Notes (Signed)
Patient is mainly concerned about tachycardia and high BP that was obtained by his home machine. Patient's BP and HR currently within normal range, assured patient that this was the case

## 2020-01-08 NOTE — ED Triage Notes (Signed)
Patient ambulatory to triage with steady gait, without difficulty or distress noted, mask in place; pt reports having mid CP, radiating into neck and mid back accomp by HTN; NTG taken PTA with relief, onset 74min PTA

## 2020-01-14 ENCOUNTER — Ambulatory Visit (INDEPENDENT_AMBULATORY_CARE_PROVIDER_SITE_OTHER): Payer: 59

## 2020-01-14 ENCOUNTER — Other Ambulatory Visit: Payer: Self-pay

## 2020-01-14 DIAGNOSIS — R079 Chest pain, unspecified: Secondary | ICD-10-CM | POA: Diagnosis not present

## 2020-01-19 ENCOUNTER — Other Ambulatory Visit
Admission: RE | Admit: 2020-01-19 | Discharge: 2020-01-19 | Disposition: A | Discharge status: Home / Self Care | Payer: No Typology Code available for payment source | Source: Ambulatory Visit | Attending: Cardiology | Admitting: Cardiology

## 2020-01-19 DIAGNOSIS — R079 Chest pain, unspecified: Secondary | ICD-10-CM | POA: Diagnosis present

## 2020-01-19 DIAGNOSIS — Z01812 Encounter for preprocedural laboratory examination: Secondary | ICD-10-CM | POA: Insufficient documentation

## 2020-01-19 LAB — BASIC METABOLIC PANEL
Anion gap: 9 (ref 5–15)
BUN: 12 mg/dL (ref 6–20)
CO2: 26 mmol/L (ref 22–32)
Calcium: 8.5 mg/dL — ABNORMAL LOW (ref 8.9–10.3)
Chloride: 99 mmol/L (ref 98–111)
Creatinine, Ser: 0.8 mg/dL (ref 0.61–1.24)
GFR calc Af Amer: >60 mL/min (ref >60)
GFR calc non Af Amer: >60 mL/min (ref >60)
Glucose, Bld: 339 mg/dL — ABNORMAL HIGH (ref 70–99)
Potassium: 3.9 mmol/L (ref 3.5–5.1)
Sodium: 134 mmol/L — ABNORMAL LOW (ref 135–145)

## 2020-01-22 ENCOUNTER — Encounter: Payer: Self-pay | Admitting: Family Medicine

## 2020-01-28 ENCOUNTER — Telehealth (HOSPITAL_COMMUNITY): Payer: Self-pay | Admitting: Emergency Medicine

## 2020-01-28 ENCOUNTER — Encounter (HOSPITAL_COMMUNITY): Payer: Self-pay

## 2020-01-28 NOTE — Telephone Encounter (Signed)
Left message on voicemail with name and callback number Shaletta Hinostroza RN Navigator Cardiac Imaging [facility] [phone] Office [phone] Cell  

## 2020-01-29 ENCOUNTER — Ambulatory Visit
Admission: RE | Admit: 2020-01-29 | Discharge: 2020-01-29 | Disposition: A | Discharge status: Home / Self Care | Payer: 59 | Source: Ambulatory Visit | Attending: Cardiology | Admitting: Cardiology

## 2020-01-29 ENCOUNTER — Other Ambulatory Visit: Payer: Self-pay

## 2020-01-29 DIAGNOSIS — R072 Precordial pain: Secondary | ICD-10-CM

## 2020-01-29 MED ORDER — METOPROLOL TARTRATE 5 MG/5ML IV SOLN
5.0000 mg | INTRAVENOUS | Status: DC | PRN
  Administered 2020-01-29: 14:00:00 5 mg via INTRAVENOUS

## 2020-01-29 MED ORDER — IOHEXOL 350 MG/ML SOLN
125.0000 mL | Freq: Once | INTRAVENOUS | Status: AC | PRN
  Administered 2020-01-29: 125 mL via INTRAVENOUS

## 2020-01-29 MED ORDER — NITROGLYCERIN 0.4 MG SL SUBL
0.8000 mg | SUBLINGUAL_TABLET | Freq: Once | SUBLINGUAL | Status: AC
  Administered 2020-01-29: 0.8 mg via SUBLINGUAL

## 2020-01-30 ENCOUNTER — Ambulatory Visit: Payer: Self-pay | Admitting: Internal Medicine

## 2020-01-30 ENCOUNTER — Telehealth: Payer: Self-pay | Admitting: Cardiology

## 2020-01-30 DIAGNOSIS — Z6834 Body mass index (BMI) 34.0-34.9, adult: Secondary | ICD-10-CM

## 2020-01-30 DIAGNOSIS — R072 Precordial pain: Secondary | ICD-10-CM | POA: Diagnosis not present

## 2020-01-30 DIAGNOSIS — I2510 Atherosclerotic heart disease of native coronary artery without angina pectoris: Secondary | ICD-10-CM

## 2020-01-30 NOTE — Telephone Encounter (Signed)
Returned the call to [facility] and spoke with Diane. They wanted to make sure that the over read by the Radiologist Dr. Kris Hartmann for the patient's Coronary CTA was viewable in the patients chart. Advised Diane that it is and that I will fwd the update to the ordering physician Dr. Garen Lah.

## 2020-01-30 NOTE — Telephone Encounter (Signed)
[facility] calling with CT results °Transferred to Lisa  °

## 2020-02-02 ENCOUNTER — Other Ambulatory Visit: Payer: Self-pay

## 2020-02-02 ENCOUNTER — Encounter: Payer: Self-pay | Admitting: Cardiology

## 2020-02-02 ENCOUNTER — Ambulatory Visit (INDEPENDENT_AMBULATORY_CARE_PROVIDER_SITE_OTHER): Payer: 59 | Admitting: Cardiology

## 2020-02-02 VITALS — BP 118/72 | HR 81 | Ht 69.0 in | Wt 242.4 lb

## 2020-02-02 DIAGNOSIS — F17200 Nicotine dependence, unspecified, uncomplicated: Secondary | ICD-10-CM | POA: Diagnosis not present

## 2020-02-02 DIAGNOSIS — I2510 Atherosclerotic heart disease of native coronary artery without angina pectoris: Secondary | ICD-10-CM

## 2020-02-02 DIAGNOSIS — R079 Chest pain, unspecified: Secondary | ICD-10-CM | POA: Diagnosis not present

## 2020-02-02 MED ORDER — METOPROLOL SUCCINATE ER 25 MG PO TB24: 25.0000 mg | ORAL_TABLET | Freq: Every day | ORAL | 4 refills | Status: DC

## 2020-02-02 MED ORDER — ATORVASTATIN CALCIUM 40 MG PO TABS: 40.0000 mg | ORAL_TABLET | Freq: Every day | ORAL | 4 refills | Status: DC

## 2020-02-02 NOTE — Progress Notes (Signed)
Cardiology Office Note:    Date:  02/02/2020   ID:  Joel Page, DOB 02-09-1971, MRN [id number]  PCP:  Hubbard Hartshorn, FNP  Cardiologist:  No primary care provider on file.  Electrophysiologist:  None   Referring MD: Hubbard Hartshorn, FNP   Chief Complaint  Patient presents with  . office visit    Pt concern w/ nicotine patch interfering with heart/ smoking causing a increase in coughing.  Meds verbally reviewed w/ pt.    History of Present Illness:    Joel Page is a 49 y.o. male with a hx of hyperlipidemia, current smoker for about 20 years who presents for follow-up.  He was last seen due to chest pain occurring for about 4 months, which he described as squeezing in nature in his mid chest, 3 out of 10 in severity.  He also has some exertional shortness of breath.  Due to risk factors of hyperlipidemia, smoking, early family history of CAD with MI in his father at age 80s, Lexiscan stress test was ordered.  But test was not performed because patient developed bronchitis.  He subsequently underwent a coronary CTA and now presents for results.  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  Denies palpitations, edema, orthopnea.  He is working on quitting smoking.  Started a nicotine patch.  Past Medical History:  Diagnosis Date  . Allergy   . Asthma   . COPD (chronic obstructive pulmonary disease) ([place]) 12/2019   probable, not officially diagnosed, but being evaluated by pulmonology  . Coronary artery disease   . Costochondritis    history of multiple episodes  . COVID-19    patient reports having COVID-19 in February 2020  . DDD (degenerative disc disease)   . Diabetes mellitus   . Insomnia   . Lumbar stenosis   . 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 EXCISIONAL HEMORRHOIDECTOMY  2012  . ORIF FEMUR FRACTURE  1989  . SPHINCTEROTOMY    . TOOTH  EXTRACTION      Current Medications: Current Meds  Medication Sig  . albuterol (VENTOLIN HFA) 108 (90 Base) MCG/ACT inhaler Inhale 2-4 puffs by mouth every 4 hours as needed for wheezing, cough, and/or shortness of breath  . aspirin 81 MG EC tablet Take 81 mg by mouth daily. Swallow whole.  . Cinnamon 500 MG capsule Take 1,000 mg by mouth daily.  . Coenzyme Q10 (CO Q 10 PO) Take by mouth. Taking 1 capsule daily  . Fluticasone-Umeclidin-Vilant (TRELEGY ELLIPTA) 100-62.5-25 MCG/INH AEPB Inhale 1 puff into the lungs daily. (Patient taking differently: Inhale 1 puff into the lungs daily. occassionally)  . Garlic 10 MG CAPS Take by mouth daily.   . metFORMIN (GLUCOPHAGE-XR) 500 MG 24 hr tablet Take 2 tablets once daily  . nitroGLYCERIN (NITROSTAT) 0.4 MG SL tablet Place 1 tablet (0.4 mg total) under the tongue every 5 (five) minutes as needed for chest pain.  . Omega-3 Fatty Acids (FISH OIL) 1000 MG CAPS Take by mouth daily.   Marland Kitchen QUERCETIN PO Take by mouth in the morning and at bedtime.  . Turmeric 500 MG TABS Take 500 mg by mouth daily.     Allergies:   Topiramate, Trazodone, Baclofen, and Diclofenac sodium   Social History   Socioeconomic History  . Marital status: Married    Spouse name: Anderson Malta  . Number of children:  3  . Years of education: Not on file  . Highest education level: Some college, no degree  Occupational History    Employer: [employer]  Tobacco Use  . Smoking status: Current Some Day Smoker    Packs/day: 2.00    Types: Cigarettes  . Smokeless tobacco: Current User  . Tobacco comment: 0.5 currently- 11/04/2019  Substance and Sexual Activity  . Alcohol use: Yes    Alcohol/week: 0.0 standard drinks  . Drug use: Never  . Sexual activity: Yes    Partners: Female  Other Topics Concern  . Not on file  Social History Narrative  . Not on file   Social Determinants of Health   Financial Resource Strain:   . Difficulty of Paying Living Expenses:   Food  Insecurity:   . Worried About Charity fundraiser in the Last Year:   . Arboriculturist in the Last Year:   Transportation Needs: Unknown  . Lack of Transportation (Medical): Not on file  . Lack of Transportation (Non-Medical): No  Physical Activity:   . Days of Exercise per Week:   . Minutes of Exercise per Session:   Stress: Stress Concern Present  . Feeling of Stress : Very much  Social Connections:   . Frequency of Communication with Friends and Family:   . Frequency of Social Gatherings with Friends and Family:   . Attends Religious Services:   . Active Member of Clubs or Organizations:   . Attends Archivist Meetings:   Marland Kitchen Marital Status:      Family History: The patient's family history includes COPD in his father; Cancer in his maternal grandfather; Dementia in his father; Diabetes in his brother; Lung cancer in his mother; Prostate cancer in his father.  ROS:   Please see the history of present illness.     All other systems reviewed and are negative.  EKGs/Labs/Other Studies Reviewed:    The following studies were reviewed today:   EKG:  EKG is  ordered today.  The ekg ordered today demonstrates normal sinus rhythm, possible left atrial enlargement, left axis deviation.  Recent Labs: 01/08/2020: ALT 13; Hemoglobin 13.8; Magnesium 1.9; Platelets 196 01/19/2020: BUN 12; Creatinine, Ser 0.80; Potassium 3.9; Sodium 134  Recent Lipid Panel    Component Value Date/Time   CHOL 149 06/13/2019 1037   CHOL 212 (H) 03/30/2016 1138   TRIG 74 06/13/2019 1037   HDL 36 (L) 06/13/2019 1037   HDL 25 (L) 03/30/2016 1138   CHOLHDL 4.1 06/13/2019 1037   VLDL 15 06/13/2019 1037   LDLCALC 98 06/13/2019 1037   LDLCALC 144 (H) 12/05/2018 1353    Physical Exam:    VS:  BP 118/72 (BP Location: Left Arm, Patient Position: Sitting, Cuff Size: Normal)   Pulse 81   Ht 5\' 9"  (1.753 m)   Wt 242 lb 6 oz (109.9 kg)   SpO2 95%   BMI 35.79 kg/m     Wt Readings from Last 3  Encounters:  02/02/20 242 lb 6 oz (109.9 kg)  01/08/20 240 lb (108.9 kg)  12/22/19 246 lb 8 oz (111.8 kg)     GEN:  Well nourished, well developed in no acute distress HEENT: Normal NECK: No JVD; No carotid bruits LYMPHATICS: No lymphadenopathy CARDIAC: RRR, no murmurs, rubs, gallops RESPIRATORY:  expiratory wheezes noted bilaterally. ABDOMEN: Soft, non-tender, non-distended MUSCULOSKELETAL:  No edema; No deformity  SKIN: Warm and dry NEUROLOGIC:  Alert and oriented x 3 PSYCHIATRIC:  Normal affect   ASSESSMENT:    1. Chest pain, unspecified type   2. Smoking   3. Coronary artery disease involving native heart, angina presence unspecified, unspecified vessel or lesion type    PLAN:    In order of problems listed above:  1. Patient with angina-like chest pain.  patient has risk factors of hyperlipidemia, diabetes, smoking history, family history of early CAD.  Echocardiogram showed normal systolic and diastolic function.  Coronary CT angiogram showed a calcium score of 272, left dominant system, moderate stenosis involving the mid LAD and mid left circumflex.  CT FFR showed significant stenosis in the distal LAD and distal left circumflex/PDA.  Will start patient on Lipitor 40 mg daily, continue aspirin 81 mg.  Recommended a left heart cath but patient will not want to have any invasive procedure at this time.  He would like to try antianginal medications.  We will start patient on Toprol-XL.  ED instructions for chest pain or worsening symptoms given to patient.   2. Smoking cessation advised, over 5 minutes spent counseling patient.  Patient has started using nicotine tablets to help quit.  Follow-up in 1 month.  Total encounter time is 45 minutes  Greater than 50% was spent in counseling and coordination of care with the patient.  Discussions with patient include causes for angina pain, different therapy types, different antianginal medications, mortality benefits or lack of for  left heart cath.   This note was generated in part or whole with voice recognition software. Voice recognition is usually quite accurate but there are transcription errors that can and very often do occur. I apologize for any typographical errors that were not detected and corrected.  Medication Adjustments/Labs and Tests Ordered: Current medicines are reviewed at length with the patient today.  Concerns regarding medicines are outlined above.  Orders Placed This Encounter  Procedures  . EKG 12-Lead   Meds ordered this encounter  Medications  . atorvastatin (LIPITOR) 40 MG tablet    Sig: Take 1 tablet (40 mg total) by mouth daily at 6 PM.    Dispense:  30 tablet    Refill:  4  . metoprolol succinate (TOPROL XL) 25 MG 24 hr tablet    Sig: Take 1 tablet (25 mg total) by mouth daily.    Dispense:  30 tablet    Refill:  4    Patient Instructions  Medication Instructions:  Your physician has recommended you make the following change in your medication:  1- START Lipitor 40 mg (1 tablet) by mouth once a day with evening meal. 2- START Toprol XL 25 mg (1 tablet) by mouth once a day.  *If you need a refill on your cardiac medications before your next appointment, please call your pharmacy*  Lab Work: none If you have labs (blood work) drawn today and your tests are completely normal, you will receive your results only by: Marland Kitchen MyChart Message (if you have MyChart) OR . A paper copy in the mail If you have any lab test that is abnormal or we need to change your treatment, we will call you to review the results.  Testing/Procedures: none  Follow-Up: At [facility], you and your health needs are our priority.  As part of our continuing mission to provide you with exceptional heart care, we have created designated Provider Care Teams.  These Care Teams include your primary Cardiologist (physician) and Advanced Practice Providers (APPs -  Physician Assistants and Nurse Practitioners) who  all work  together to provide you with the care you need, when you need it.  We recommend signing up for the patient portal called "MyChart".  Sign up information is provided on this After Visit Summary.  MyChart is used to connect with patients for Virtual Visits (Telemedicine).  Patients are able to view lab/test results, encounter notes, upcoming appointments, etc.  Non-urgent messages can be sent to your provider as well.   To learn more about what you can do with MyChart, go to [email or web address].    Your next appointment:   1 month(s)  The format for your next appointment:   In Person  Provider:   Kate Sable, MD     Signed, Kate Sable, MD  02/02/2020 9:31 AM    [place]

## 2020-02-02 NOTE — Patient Instructions (Signed)
Medication Instructions:  Your physician has recommended you make the following change in your medication:  1- START Lipitor 40 mg (1 tablet) by mouth once a day with evening meal. 2- START Toprol XL 25 mg (1 tablet) by mouth once a day.  *If you need a refill on your cardiac medications before your next appointment, please call your pharmacy*  Lab Work: none If you have labs (blood work) drawn today and your tests are completely normal, you will receive your results only by: Marland Kitchen MyChart Message (if you have MyChart) OR . A paper copy in the mail If you have any lab test that is abnormal or we need to change your treatment, we will call you to review the results.  Testing/Procedures: none  Follow-Up: At [facility], you and your health needs are our priority.  As part of our continuing mission to provide you with exceptional heart care, we have created designated Provider Care Teams.  These Care Teams include your primary Cardiologist (physician) and Advanced Practice Providers (APPs -  Physician Assistants and Nurse Practitioners) who all work together to provide you with the care you need, when you need it.  We recommend signing up for the patient portal called "MyChart".  Sign up information is provided on this After Visit Summary.  MyChart is used to connect with patients for Virtual Visits (Telemedicine).  Patients are able to view lab/test results, encounter notes, upcoming appointments, etc.  Non-urgent messages can be sent to your provider as well.   To learn more about what you can do with MyChart, go to [email or web address].    Your next appointment:   1 month(s)  The format for your next appointment:   In Person  Provider:   Kate Sable, MD

## 2020-02-09 IMAGING — CR DG CHEST 2V
1 series · 2 of 2 positions shown · non-contrast
Comparison: 06/13/2019; 05/08/2010; right rib radiographic
series-07/17/2019

CLINICAL DATA: History of broken ribs now with worsening
right-sided chest pain after a cough.

EXAM:
CHEST - 2 VIEW

[Series 1: dg chest 2 view · 0.14mm/px · 2 of 2 slices shown]
[im 1/2]
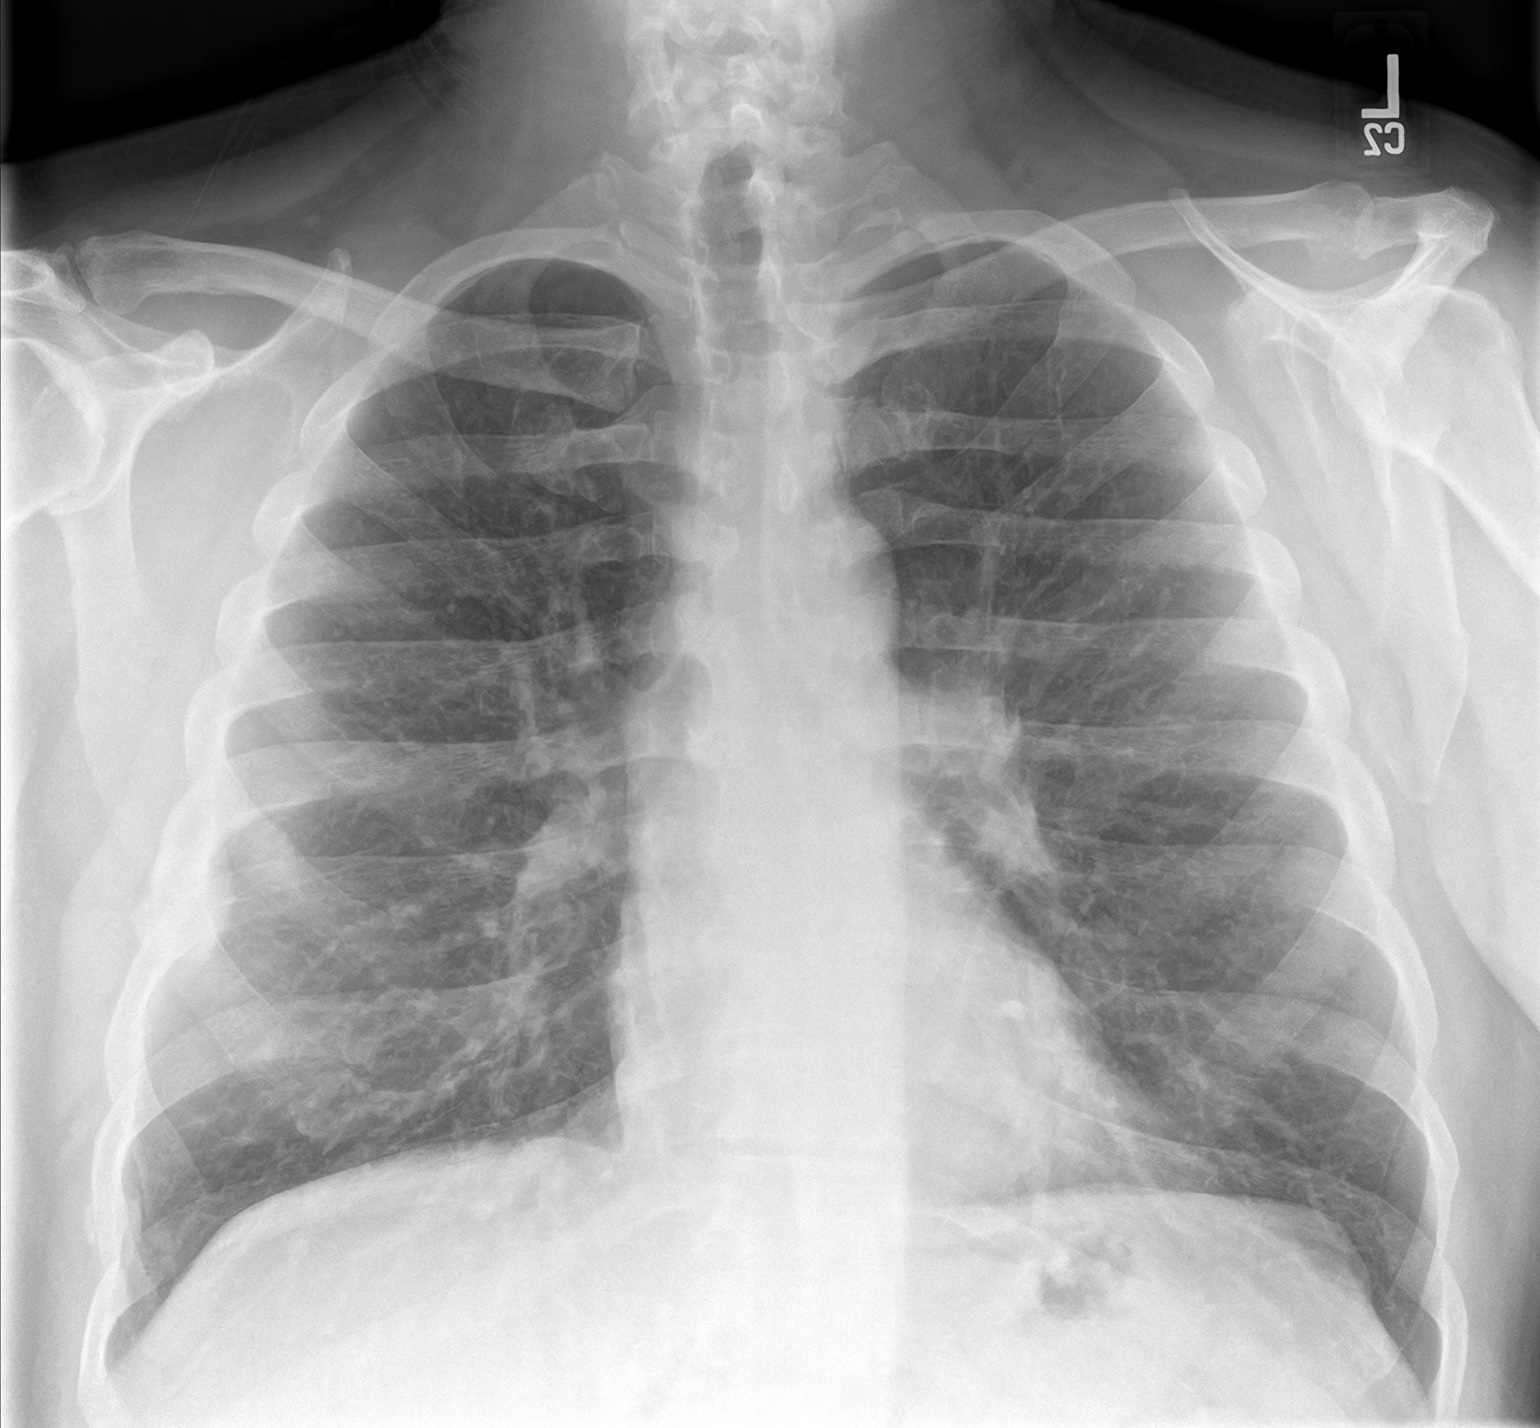
[im 2/2]
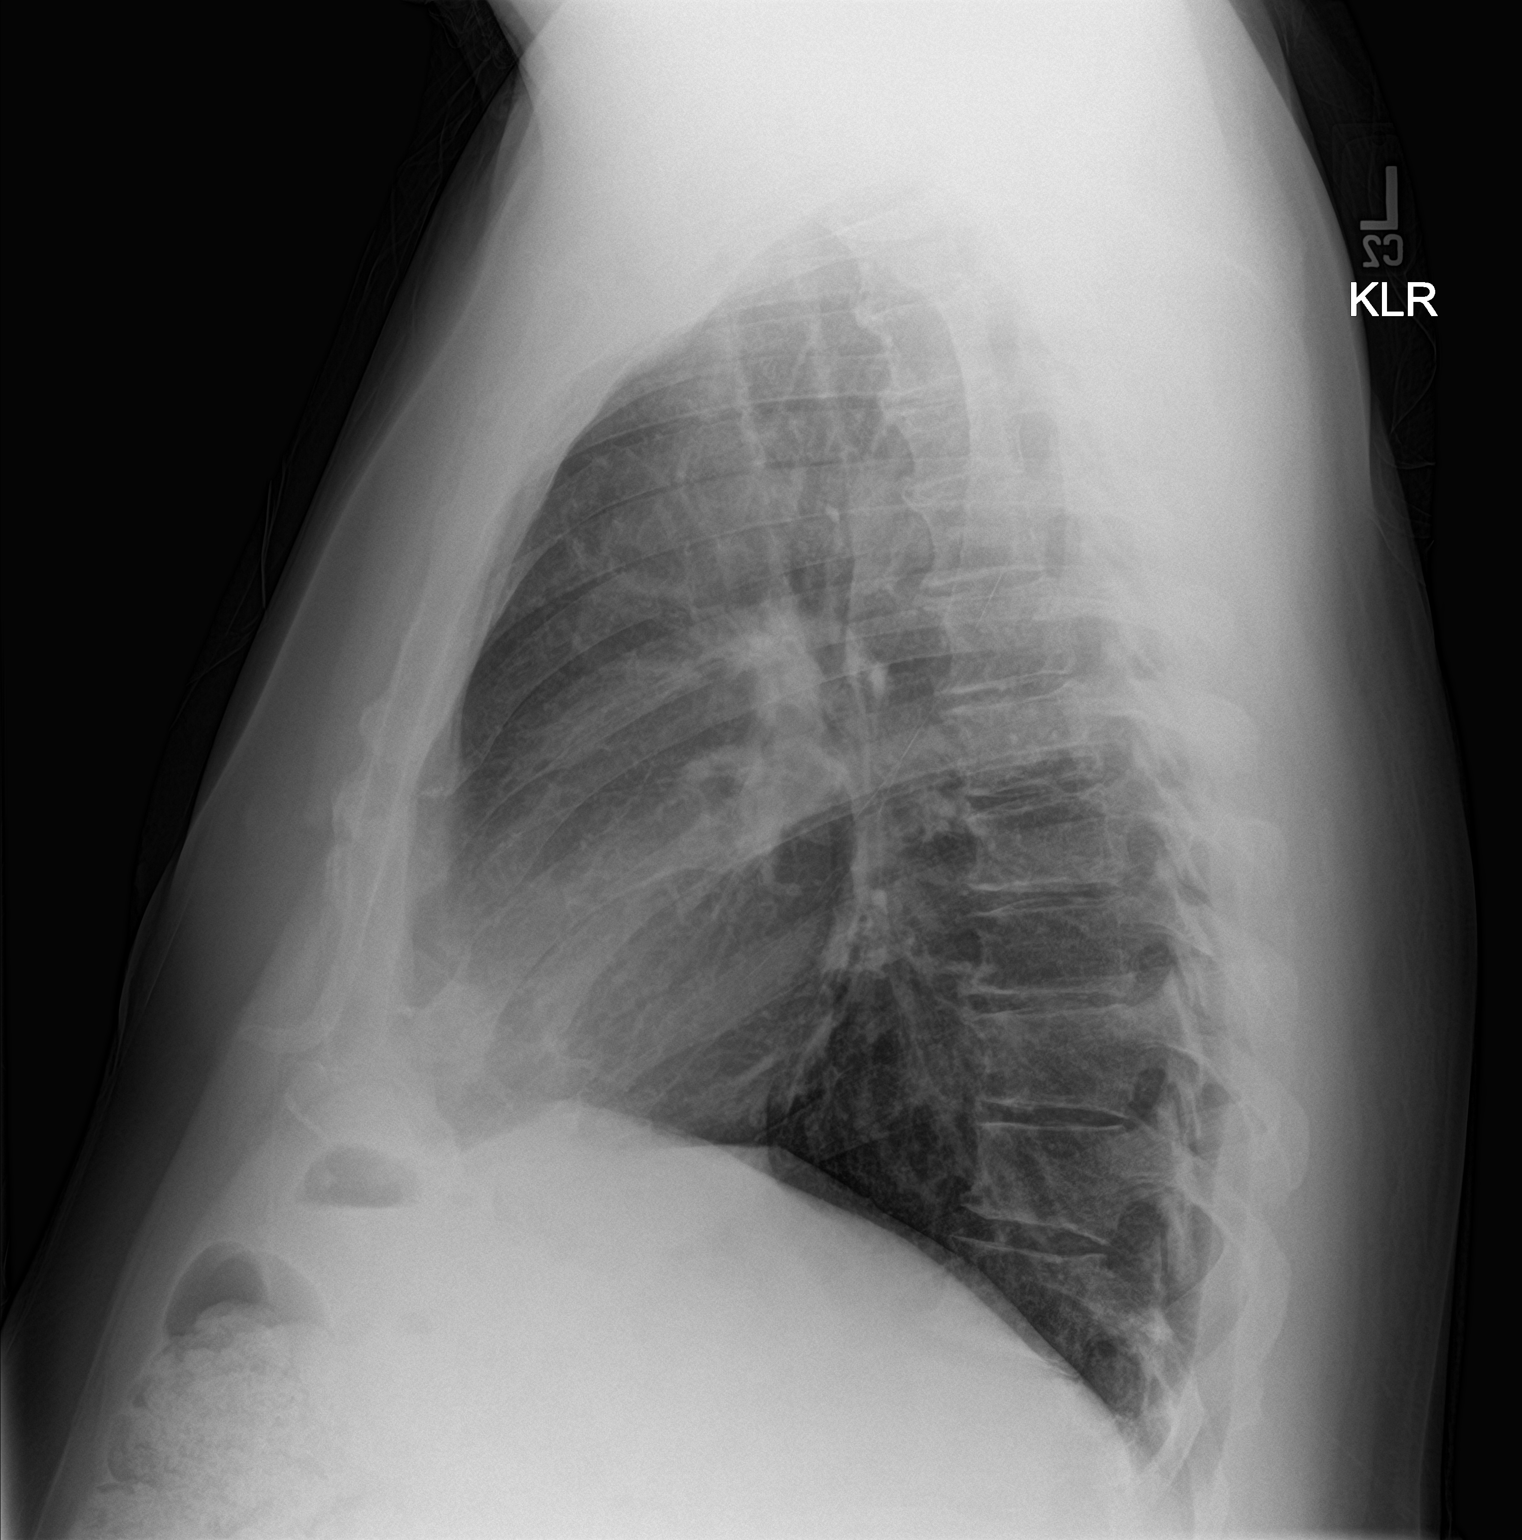

[2 of 2 positions shown; findings below may reference images not displayed]

FINDINGS: Grossly unchanged cardiac silhouette and mediastinal contours. No
focal airspace opacities. No pleural effusion or pneumothorax. No
evidence of edema. Callus formation surrounds the anterior aspects
of the right 6th, 7th and 9th ribs. No definite acute displaced
right-sided rib fractures.
IMPRESSION: 1. Hyperexpanded lungs without superimposed acute cardiopulmonary
disease.
2. Old fractures involving the right 6th, 7th and 9th ribs with
associated callus formation.

## 2020-02-10 ENCOUNTER — Encounter: Payer: Self-pay | Admitting: Family Medicine

## 2020-02-11 NOTE — Progress Notes (Signed)
Patient ID: Joel Page, male    DOB: [birth year], 49 y.o.   MRN: [id number]  PCP: Hubbard Hartshorn, FNP  Chief Complaint  Patient presents with  . Urinary Tract Infection    orange colored urine  . Pruritis    feels like "inside of urethra is itching"  . Urinary Frequency    Subjective:   Joel Page is a 49 y.o. male, presents to clinic with CC of the following:  Chief Complaint  Patient presents with  . Urinary Tract Infection    orange colored urine  . Pruritis    feels like "inside of urethra is itching"  . Urinary Frequency    HPI:  Patient is a 49 year old male patient of Raelyn Ensign who most recently sent a message on 3/23 (just after midnight) noting symptoms of dark urine, almost orange in color, and being itchy.  It was scheduled a follow-up today.  He noted last week he felt itchiness in his urethra, and in addition has some itchiness in other places like his foot which he states he scratched often, and then went to the bathroom a couple nights, and noted that his urine turned the bowl orange.  Denies any pain with urination, no blood.  He has been going to the bathroom more as well, although does note he drinks a lot of fluids during the day.  The itchiness in the urethra has lessened over time, and he notes it is a lot better.  Denies any discharge, no rash. We spent time talking about concerns for his blood sugars and diabetes, and he states she now is taking his sugars again, and the last 3 checks have been reasonable.  They are not fasting checks, and last night it was 160.  He has not checked it in the morning when he first gets up before eating. He notes he often eats dinner, gets tired and sleeps, wakes up, eats again, takes a melatonin, and then goes back to sleep.  Did discuss how this is not a great routine.  He states he is trying to watch his diet more after his visit with cardiology and concerns with atherosclerosis noted.  He also notes he  takes a lot of supplements, and had questions about diet, supplement use, and his biggest concern was how it would affect his liver.  He denied any alcohol use. He asked about his last testing results including his liver test results, and they were shared with him. He noted he is married, and has been monogamous, with no other partners in the recent past.  He did note he wanted to get his urine checked for the chlamydia and gonorrhea as I discussed that, and he then had questions about if it was positive I emphasized that we need to check and get the test back and get the results and then address matters depending on the results.  His most recent follow-up with cardiology on 02/02/2020 after persistent chest pain was evaluated with summarized as follows: 1. Patient with angina-like chest pain.  patient has risk factors of hyperlipidemia, diabetes, smoking history, family history of early CAD.  Echocardiogram showed normal systolic and diastolic function.  Coronary CT angiogram showed a calcium score of 272, left dominant system, moderate stenosis involving the mid LAD and mid left circumflex.  CT FFR showed significant stenosis in the distal LAD and distal left circumflex/PDA.  Will start patient on Lipitor 40 mg daily, continue aspirin 81 mg.  Recommended a  left heart cath but patient will not want to have any invasive procedure at this time.  He would like to try antianginal medications.  We will start patient on Toprol-XL.  ED instructions for chest pain or worsening symptoms given to patient.   2. Smoking cessation advised, over 5 minutes spent counseling patient.  Patient has started using nicotine tablets to help quit.  He has also seen pulmonary, last visit at the end of January for COPD plus/minus asthma, and samples of Trelegy given for him to start at that visit.  He was referred to cardiology due to his chest pain concerns.  He also is diabetic, with his sugars not as well controlled in the  recent past.  His last 2 blood glucose readings in the past 7 to 8 weeks have both been over 300.  His kidney function has remained stable. Lab Results  Component Value Date   HGBA1C 7.9 (H) 07/17/2019   HGBA1C 7.5 (H) 12/05/2018   HGBA1C 6.7 (A) 07/09/2018   HGBA1C 6.7 07/09/2018   HGBA1C 0 (A) 07/09/2018   HGBA1C 0.0 07/09/2018   Lab Results  Component Value Date   MICROALBUR 0.2 07/17/2019   [place] 98 06/13/2019   CREATININE 0.80 01/19/2020     Patient Active Problem List   Diagnosis Date Noted  . History of leukocytosis 09/30/2019  . Multiple closed fractures of ribs of right side 06/30/2019  . Esophageal dysphagia 06/30/2019  . Cervical stenosis of spinal canal 08/15/2016  . Hyperlipidemia LDL goal <100 07/04/2016  . Obesity 06/29/2016  . Back pain at L4-L5 level 04/07/2016  . Cough 03/30/2016  . Medication monitoring encounter 03/30/2016  . COPD suggested by initial evaluation ([place]) 12/22/2015  . 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  . Diabetes mellitus type 2, uncontrolled ([place]) 05/11/2015  . Current smoker 05/11/2015  . Hemorrhoids, internal 05/11/2015  . Arthralgia of hip 05/11/2015  . 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  . Restless leg 05/11/2015  . Trochanteric bursitis of right hip 06/17/2013  . Sciatica 03/19/2012  . Lumbar degenerative disc disease 03/19/2012      Current Outpatient Medications:  .  albuterol (VENTOLIN HFA) 108 (90 Base) MCG/ACT inhaler, Inhale 2-4 puffs by mouth every 4 hours as needed for wheezing, cough, and/or shortness of breath, Disp: 18 g, Rfl: 1 .  aspirin 81 MG EC tablet, Take 81 mg by mouth daily. Swallow whole., Disp: , Rfl:  .  atorvastatin (LIPITOR) 40 MG tablet, Take 1 tablet (40 mg total) by mouth daily at 6 PM., Disp: 30 tablet, Rfl: 4 .  Cinnamon 500 MG capsule, Take 1,000 mg by mouth daily., Disp: , Rfl:  .  Coenzyme Q10 (CO Q 10 PO), Take by mouth. Taking 1 capsule daily, Disp:  , Rfl:  .  Fluticasone-Umeclidin-Vilant (TRELEGY ELLIPTA) 100-62.5-25 MCG/INH AEPB, Inhale 1 puff into the lungs daily. (Patient taking differently: Inhale 1 puff into the lungs daily. occassionally), Disp: 28 each, Rfl: 0 .  Garlic 10 MG CAPS, Take by mouth daily. , Disp: , Rfl:  .  metFORMIN (GLUCOPHAGE-XR) 500 MG 24 hr tablet, Take 2 tablets once daily, Disp: 180 tablet, Rfl: 1 .  metoprolol succinate (TOPROL XL) 25 MG 24 hr tablet, Take 1 tablet (25 mg total) by mouth daily., Disp: 30 tablet, Rfl: 4 .  Omega-3 Fatty Acids (FISH OIL) 1000 MG CAPS, Take by mouth daily. , Disp: , Rfl:  .  QUERCETIN PO, Take by mouth in the morning and  at bedtime., Disp: , Rfl:  .  Turmeric 500 MG TABS, Take 500 mg by mouth daily., Disp: , Rfl:  .  nitroGLYCERIN (NITROSTAT) 0.4 MG SL tablet, Place 1 tablet (0.4 mg total) under the tongue every 5 (five) minutes as needed for chest pain. (Patient not taking: Reported on 02/12/2020), Disp: 25 tablet, Rfl: 0   Allergies  Allergen Reactions  . Topiramate Other (See Comments)    White light in peripheral vision  . Trazodone Other (See Comments)    Makes tingling & numbness in hands & arms worse; causes an erection.  . Baclofen Itching  . Diclofenac Sodium Other (See Comments)    Raised blood sugar      Past Surgical History:  Procedure Laterality Date  . EXCISIONAL HEMORRHOIDECTOMY  2012  . ORIF FEMUR FRACTURE  1989  . SPHINCTEROTOMY    . TOOTH EXTRACTION       Family History  Problem Relation Age of Onset  . Lung cancer Mother   . Dementia Father   . COPD Father   . Prostate cancer Father   . Diabetes Brother   . Cancer Maternal Grandfather      Social History   Tobacco Use  . Smoking status: Current Some Day Smoker    Packs/day: 2.00    Types: Cigarettes  . Smokeless tobacco: Current User  . Tobacco comment: 0.5 currently- 11/04/2019  Substance Use Topics  . Alcohol use: Yes    Alcohol/week: 0.0 standard drinks    With staff  assistance, above reviewed with the patient today.  ROS: As per HPI, otherwise no specific complaints on a limited and focused system review   No results found for this or any previous visit (from the past 72 hour(s)).   PHQ2/9: Depression screen [facility] 2/9 02/12/2020 09/30/2019 09/02/2019 06/30/2019 06/13/2019  Decreased Interest 0 0 0 0 0  Down, Depressed, Hopeless 0 0 0 0 0  PHQ - 2 Score 0 0 0 0 0  Altered sleeping 1 0 0 0 0  Tired, decreased energy 0 0 0 0 0  Change in appetite 0 0 0 0 0  Feeling bad or failure about yourself  0 0 0 0 0  Trouble concentrating 0 0 0 0 0  Moving slowly or fidgety/restless 0 0 0 0 0  Suicidal thoughts 0 0 0 0 0  PHQ-9 Score 1 0 0 0 0  Difficult doing work/chores Not difficult at all Not difficult at all Not difficult at all Not difficult at all Not difficult at all  Some recent data might be hidden   PHQ-2/9 Result reviewed  Fall Risk: Fall Risk  02/12/2020 09/30/2019 09/02/2019 06/30/2019 06/13/2019  Falls in the past year? 0 0 0 0 0  Number falls in past yr: 0 0 0 0 0  Injury with Fall? 0 0 0 0 0  Risk for fall due to : - - - - -  Follow up - Falls evaluation completed Falls evaluation completed Falls evaluation completed -      Objective:   Vitals:   02/12/20 1309  BP: 118/74  Pulse: 89  Resp: 16  Temp: (!) 97.1 F (36.2 C)  TempSrc: Temporal  SpO2: 95%  Weight: 238 lb 1.6 oz (108 kg)  Height: 5\' 9"  (1.753 m)    Body mass index is 35.16 kg/m.  Physical Exam   NAD, masked HEENT - [place]/AT, sclera anicteric, conj - non-inj'ed, pharynx clear Neck - supple,  Car - RRR without m/g/r  Abd - soft, NT diffusely, no suprapubic tenderness Back - no CVA tenderness Ext - no LE edema,  Neuro/psychiatric - affect was not flat, appropriate with conversation  Alert and oriented  Speech normal   Results for orders placed or performed during the hospital encounter of 0000000  Basic metabolic panel  Result Value Ref Range   Sodium 134 (L) 135  - 145 mmol/L   Potassium 3.9 3.5 - 5.1 mmol/L   Chloride 99 98 - 111 mmol/L   CO2 26 22 - 32 mmol/L   Glucose, Bld 339 (H) 70 - 99 mg/dL   BUN 12 6 - 20 mg/dL   Creatinine, Ser 0.80 0.61 - 1.24 mg/dL   Calcium 8.5 (L) 8.9 - 10.3 mg/dL   GFR calc non Af Amer >60 >60 mL/min   GFR calc Af Amer >60 >60 mL/min   Anion gap 9 5 - 15   Last comp panel was in February and reviewed.  Urine dip done in the office was positive for protein, and otherwise negative.    Assessment & Plan:   1. Frequency of urination Discussed with patient the urine dip results, and unlikely infection based on that.  Will send for urinalysis and culture for completeness sake. More concerned with his blood sugars being elevated, as that can cause frequency, and sometimes can be an irritant to the urethra resulting from changes in the urine as a result. Recommended getting some fasting blood sugar measurements at home, and writing them down and also continuing to check sugars periodically as she is doing and record Await the urine results, and then when he is notified, will discuss a follow-up with his blood sugar readings at home and some further test results.  Did review the BMP he just had a March. Answered a lot of questions he had, and recommended a diet low in carbs, and also lower in saturated fats, and information was provided in the AVS. - POCT Urinalysis Dipstick - Urine Culture - Urinalysis, Complete  2. Abnormal urine color Noted many things can affect urine color, and not sure about the orange color described.  Await above labs ordered.  3. Urethritis Discussed probable inflammatory rather than infectious source for his urethritis, likely source of the itch at times. Will order a GC and chlamydia for completeness sake as he wanted to pursue. Also await the UA and culture result. It is better at this point. Noted can try a Benadryl type product for itchiness, although not to take when out and about as  may make drowsy. Reassured with the recent comp panel and BMP for some other causes of itch, although his seems to be more focal in places rather than diffuse systemic itch. - Urine cytology ancillary only  4. Uncontrolled type 2 diabetes mellitus with hyperglycemia ([place]) As above, spent a lot of time in discussion about concerns with his sugars recently. Await further follow-ups after above results have returned. Noted with his known heart disease, the extreme importance of keeping his blood sugars better controlled. He also continued to go back to supplements he was taking or had taken in the past, and I noted to him I do not have the expertise in supplements to comment, that it is an unregulated industry, and have to be careful with supplements in general.          Towanda Malkin, MD 02/12/20 1:22 PM

## 2020-02-12 ENCOUNTER — Other Ambulatory Visit: Payer: Self-pay

## 2020-02-12 ENCOUNTER — Encounter: Payer: Self-pay | Admitting: Internal Medicine

## 2020-02-12 ENCOUNTER — Ambulatory Visit (INDEPENDENT_AMBULATORY_CARE_PROVIDER_SITE_OTHER): Payer: 59 | Admitting: Internal Medicine

## 2020-02-12 ENCOUNTER — Other Ambulatory Visit (HOSPITAL_COMMUNITY)
Admission: RE | Admit: 2020-02-12 | Discharge: 2020-02-12 | Disposition: A | Discharge status: Home / Self Care | Payer: 59 | Source: Ambulatory Visit | Attending: Internal Medicine | Admitting: Internal Medicine

## 2020-02-12 VITALS — BP 118/74 | HR 89 | Temp 97.1°F | Resp 16 | Ht 69.0 in | Wt 238.1 lb

## 2020-02-12 DIAGNOSIS — R3989 Other symptoms and signs involving the genitourinary system: Secondary | ICD-10-CM | POA: Diagnosis not present

## 2020-02-12 DIAGNOSIS — R350 Frequency of micturition: Secondary | ICD-10-CM

## 2020-02-12 DIAGNOSIS — N342 Other urethritis: Secondary | ICD-10-CM | POA: Insufficient documentation

## 2020-02-12 DIAGNOSIS — E1165 Type 2 diabetes mellitus with hyperglycemia: Secondary | ICD-10-CM

## 2020-02-12 LAB — POCT URINALYSIS DIPSTICK
Bilirubin, UA: NEGATIVE
Blood, UA: NEGATIVE
Glucose, UA: NEGATIVE
Ketones, UA: NEGATIVE
Leukocytes, UA: NEGATIVE
Nitrite, UA: NEGATIVE
Odor: NORMAL
Protein, UA: POSITIVE — AB
Spec Grav, UA: 1.01 (ref 1.010–1.025)
Urobilinogen, UA: 0.2 E.U./dL
pH, UA: 6 (ref 5.0–8.0)

## 2020-02-12 NOTE — Patient Instructions (Signed)

## 2020-02-13 LAB — URINALYSIS, COMPLETE
Bacteria, UA: NONE SEEN /HPF
Bilirubin Urine: NEGATIVE
Glucose, UA: NEGATIVE
Hgb urine dipstick: NEGATIVE
Hyaline Cast: NONE SEEN /LPF
Ketones, ur: NEGATIVE
Leukocytes,Ua: NEGATIVE
Nitrite: NEGATIVE
Protein, ur: NEGATIVE
RBC / HPF: NONE SEEN /HPF (ref 0–2)
Specific Gravity, Urine: 1.017 (ref 1.001–1.03)
Squamous Epithelial / HPF: NONE SEEN /HPF (ref <=5)
WBC, UA: NONE SEEN /HPF (ref 0–5)
pH: 6.5 (ref 5.0–8.0)

## 2020-02-13 LAB — URINE CULTURE
MICRO NUMBER:: 10294643
Result:: NO GROWTH
SPECIMEN QUALITY:: ADEQUATE

## 2020-02-16 LAB — URINE CYTOLOGY ANCILLARY ONLY
Chlamydia: NEGATIVE
Comment: NEGATIVE
Comment: NORMAL
Neisseria Gonorrhea: NEGATIVE

## 2020-02-17 ENCOUNTER — Ambulatory Visit: Payer: No Typology Code available for payment source | Admitting: Cardiology

## 2020-02-20 ENCOUNTER — Encounter: Payer: Self-pay | Admitting: Internal Medicine

## 2020-03-05 ENCOUNTER — Ambulatory Visit (INDEPENDENT_AMBULATORY_CARE_PROVIDER_SITE_OTHER): Payer: No Typology Code available for payment source | Admitting: Cardiology

## 2020-03-05 ENCOUNTER — Other Ambulatory Visit: Payer: Self-pay

## 2020-03-05 ENCOUNTER — Encounter: Payer: Self-pay | Admitting: Cardiology

## 2020-03-05 VITALS — BP 110/60 | HR 81 | Ht 69.0 in | Wt 236.0 lb

## 2020-03-05 DIAGNOSIS — F17200 Nicotine dependence, unspecified, uncomplicated: Secondary | ICD-10-CM | POA: Diagnosis not present

## 2020-03-05 DIAGNOSIS — I2510 Atherosclerotic heart disease of native coronary artery without angina pectoris: Secondary | ICD-10-CM | POA: Diagnosis not present

## 2020-03-05 DIAGNOSIS — E7800 Pure hypercholesterolemia, unspecified: Secondary | ICD-10-CM

## 2020-03-05 MED ORDER — ISOSORBIDE MONONITRATE ER 30 MG PO TB24: 30.0000 mg | ORAL_TABLET | Freq: Every day | ORAL | 6 refills | Status: DC

## 2020-03-05 NOTE — Progress Notes (Signed)
Cardiology Office Note:    Date:  03/05/2020   ID:  Joel Page, DOB [birth year], MRN [id number]  PCP:  Hubbard Hartshorn, FNP  Cardiologist:  No primary care provider on file.  Electrophysiologist:  None   Referring MD: Hubbard Hartshorn, FNP   Chief Complaint  Patient presents with  . other    1 month follow up. Pt. c/o chest pain and shortness of breath while he was moving/lifting boxes this past Saturday.     History of Present Illness:    Joel Page is a 49 y.o. male with a hx of hyperlipidemia, CAD, stable angina, current smoker for about 20 years who presents for follow-up.  Patient has stable anginal symptoms.  Couple of weeks ago he had chest pain after lifting up boxes and furniture when he was trying to move.  Sublingual nitroglycerin resolve the pain.  He had another episode of chest discomfort when he was exerting himself also relieved with nitroglycerin.  Otherwise he states feeling fine.  He has cut down his smoking to half a pack a day.  He is working on totally quitting smoking.  His coronary CTA on 01/29/2020 had a calcium score of 272, moderate stenosis noted in the mid LAD and mid left circumflex.  FFR CT showed significant stenosis in the distal LAD and left circumflex.  Left heart cath was recommended but patient declined and wanted medical management.  Toprol-XL was started.   Past Medical History:  Diagnosis Date  . Allergy   . Asthma   . COPD (chronic obstructive pulmonary disease) ([place]) 12/2019   probable, not officially diagnosed, but being evaluated by pulmonology  . Coronary artery disease   . Costochondritis    history of multiple episodes  . COVID-19    patient reports having COVID-19 in February 2020  . DDD (degenerative disc disease)   . Diabetes mellitus   . Insomnia   . Lumbar stenosis   . 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 EXCISIONAL  HEMORRHOIDECTOMY  2012  . ORIF FEMUR FRACTURE  1989  . SPHINCTEROTOMY    . TOOTH EXTRACTION      Current Medications: Current Meds  Medication Sig  . albuterol (VENTOLIN HFA) 108 (90 Base) MCG/ACT inhaler Inhale 2-4 puffs by mouth every 4 hours as needed for wheezing, cough, and/or shortness of breath  . aspirin 81 MG EC tablet Take 81 mg by mouth daily. Swallow whole.  Marland Kitchen atorvastatin (LIPITOR) 40 MG tablet Take 1 tablet (40 mg total) by mouth daily at 6 PM.  . Cinnamon 500 MG capsule Take 1,000 mg by mouth daily.  . Coenzyme Q10 (CO Q 10 PO) Take by mouth. Taking 1 capsule daily  . Fluticasone-Umeclidin-Vilant (TRELEGY ELLIPTA) 100-62.5-25 MCG/INH AEPB Inhale 1 puff into the lungs daily. (Patient taking differently: Inhale 1 puff into the lungs daily. occassionally)  . Garlic 10 MG CAPS Take by mouth daily.   . metFORMIN (GLUCOPHAGE-XR) 500 MG 24 hr tablet Take 2 tablets once daily  . metoprolol succinate (TOPROL XL) 25 MG 24 hr tablet Take 1 tablet (25 mg total) by mouth daily.  . nitroGLYCERIN (NITROSTAT) 0.4 MG SL tablet Place 1 tablet (0.4 mg total) under the tongue every 5 (five) minutes as needed for chest pain.  . Omega-3 Fatty Acids (FISH OIL) 1000 MG CAPS Take by mouth daily.   Marland Kitchen QUERCETIN PO Take by mouth  in the morning and at bedtime.  . Turmeric 500 MG TABS Take 500 mg by mouth daily.     Allergies:   Topiramate, Trazodone, Baclofen, and Diclofenac sodium   Social History   Socioeconomic History  . Marital status: Married    Spouse name: Anderson Malta  . Number of children: 3  . Years of education: Not on file  . Highest education level: Some college, no degree  Occupational History    Employer: [employer]  Tobacco Use  . Smoking status: Current Some Day Smoker    Packs/day: 2.00    Types: Cigarettes  . Smokeless tobacco: Current User  . Tobacco comment: 0.5 currently- 11/04/2019  Substance and Sexual Activity  . Alcohol use: Yes    Alcohol/week: 0.0  standard drinks  . Drug use: Never  . Sexual activity: Yes    Partners: Female  Other Topics Concern  . Not on file  Social History Narrative  . Not on file   Social Determinants of Health   Financial Resource Strain:   . Difficulty of Paying Living Expenses:   Food Insecurity:   . Worried About Charity fundraiser in the Last Year:   . Arboriculturist in the Last Year:   Transportation Needs: Unknown  . Lack of Transportation (Medical): Not on file  . Lack of Transportation (Non-Medical): No  Physical Activity:   . Days of Exercise per Week:   . Minutes of Exercise per Session:   Stress: Stress Concern Present  . Feeling of Stress : Very much  Social Connections:   . Frequency of Communication with Friends and Family:   . Frequency of Social Gatherings with Friends and Family:   . Attends Religious Services:   . Active Member of Clubs or Organizations:   . Attends Archivist Meetings:   Marland Kitchen Marital Status:      Family History: The patient's family history includes COPD in his father; Cancer in his maternal grandfather; Dementia in his father; Diabetes in his brother; Lung cancer in his mother; Prostate cancer in his father.  ROS:   Please see the history of present illness.     All other systems reviewed and are negative.  EKGs/Labs/Other Studies Reviewed:    The following studies were reviewed today:   EKG:  EKG is  ordered today.  The ekg ordered today demonstrates normal sinus rhythm, normal ECG.  Recent Labs: 01/08/2020: ALT 13; Hemoglobin 13.8; Magnesium 1.9; Platelets 196 01/19/2020: BUN 12; Creatinine, Ser 0.80; Potassium 3.9; Sodium 134  Recent Lipid Panel    Component Value Date/Time   CHOL 149 06/13/2019 1037   CHOL 212 (H) 03/30/2016 1138   TRIG 74 06/13/2019 1037   HDL 36 (L) 06/13/2019 1037   HDL 25 (L) 03/30/2016 1138   CHOLHDL 4.1 06/13/2019 1037   VLDL 15 06/13/2019 1037   LDLCALC 98 06/13/2019 1037   LDLCALC 144 (H) 12/05/2018 1353      Physical Exam:    VS:  BP 110/60 (BP Location: Left Arm, Patient Position: Sitting, Cuff Size: Large)   Pulse 81   Ht 5\' 9"  (1.753 m)   Wt 236 lb (107 kg)   SpO2 98%   BMI 34.85 kg/m     Wt Readings from Last 3 Encounters:  03/05/20 236 lb (107 kg)  02/12/20 238 lb 1.6 oz (108 kg)  02/02/20 242 lb 6 oz (109.9 kg)     GEN:  Well nourished, well developed in no  acute distress HEENT: Normal NECK: No JVD; No carotid bruits LYMPHATICS: No lymphadenopathy CARDIAC: RRR, no murmurs, rubs, gallops RESPIRATORY:  expiratory wheezes noted bilaterally. ABDOMEN: Soft, non-tender, non-distended MUSCULOSKELETAL:  No edema; No deformity  SKIN: Warm and dry NEUROLOGIC:  Alert and oriented x 3 PSYCHIATRIC:  Normal affect   ASSESSMENT:    1. Coronary artery disease involving native heart, angina presence unspecified, unspecified vessel or lesion type   2. Smoking   3. Pure hypercholesterolemia    PLAN:    In order of problems listed above:  1. Patient still with stable angina symptoms.  Echocardiogram showed normal systolic and diastolic function.  Coronary CT angiogram showed a calcium score of 272, left dominant system, moderate stenosis involving the mid LAD and mid left circumflex.  CT FFR showed significant stenosis in the distal LAD and distal left circumflex/PDA.  Patient declined left heart cath.  Continue aspirin, Lipitor.   Start Imdur 30 mg daily.  Continue Toprol-XL 25 mg daily.     2. Patient is a current smoker, he is working on quitting.  Has reduced his amount of smoking.  He was encouraged and advised to continue with the quitting process 3. History of hyperlipidemia, continue Lipitor.  Follow-up in 3 months  Total encounter time is 45 minutes  Greater than 50% was spent in counseling and coordination of care with the patient.  Discussions with patient include medication therapy for stable angina, side effects of medications, medication management.   This note was  generated in part or whole with voice recognition software. Voice recognition is usually quite accurate but there are transcription errors that can and very often do occur. I apologize for any typographical errors that were not detected and corrected.  Medication Adjustments/Labs and Tests Ordered: Current medicines are reviewed at length with the patient today.  Concerns regarding medicines are outlined above.  Orders Placed This Encounter  Procedures  . EKG 12-Lead   Meds ordered this encounter  Medications  . isosorbide mononitrate (IMDUR) 30 MG 24 hr tablet    Sig: Take 1 tablet (30 mg total) by mouth daily.    Dispense:  30 tablet    Refill:  6    Patient Instructions  Medication Instructions:  - Your physician has recommended you make the following change in your medication:   1) Start imdur (isosorbide mononitrate) 30 mg- take 1 tablet by mouth once daily   *If you need a refill on your cardiac medications before your next appointment, please call your pharmacy*   Lab Work: - none ordered  If you have labs (blood work) drawn today and your tests are completely normal, you will receive your results only by: Marland Kitchen MyChart Message (if you have MyChart) OR . A paper copy in the mail If you have any lab test that is abnormal or we need to change your treatment, we will call you to review the results.   Testing/Procedures: - none ordered   Follow-Up: At [facility], you and your health needs are our priority.  As part of our continuing mission to provide you with exceptional heart care, we have created designated Provider Care Teams.  These Care Teams include your primary Cardiologist (physician) and Advanced Practice Providers (APPs -  Physician Assistants and Nurse Practitioners) who all work together to provide you with the care you need, when you need it.  We recommend signing up for the patient portal called "MyChart".  Sign up information is provided on this After Visit  Summary.  MyChart is used to connect with patients for Virtual Visits (Telemedicine).  Patients are able to view lab/test results, encounter notes, upcoming appointments, etc.  Non-urgent messages can be sent to your provider as well.   To learn more about what you can do with MyChart, go to [email or web address].    Your next appointment:   3 month(s)  The format for your next appointment:   In Person  Provider:   Kate Sable, MD   Other Instructions n/a     Signed, Kate Sable, MD  03/05/2020 5:21 PM    [place]

## 2020-03-05 NOTE — Patient Instructions (Signed)
Medication Instructions:  - Your physician has recommended you make the following change in your medication:   1) Start imdur (isosorbide mononitrate) 30 mg- take 1 tablet by mouth once daily   *If you need a refill on your cardiac medications before your next appointment, please call your pharmacy*   Lab Work: - none ordered  If you have labs (blood work) drawn today and your tests are completely normal, you will receive your results only by: Marland Kitchen MyChart Message (if you have MyChart) OR . A paper copy in the mail If you have any lab test that is abnormal or we need to change your treatment, we will call you to review the results.   Testing/Procedures: - none ordered   Follow-Up: At [facility], you and your health needs are our priority.  As part of our continuing mission to provide you with exceptional heart care, we have created designated Provider Care Teams.  These Care Teams include your primary Cardiologist (physician) and Advanced Practice Providers (APPs -  Physician Assistants and Nurse Practitioners) who all work together to provide you with the care you need, when you need it.  We recommend signing up for the patient portal called "MyChart".  Sign up information is provided on this After Visit Summary.  MyChart is used to connect with patients for Virtual Visits (Telemedicine).  Patients are able to view lab/test results, encounter notes, upcoming appointments, etc.  Non-urgent messages can be sent to your provider as well.   To learn more about what you can do with MyChart, go to [email or web address].    Your next appointment:   3 month(s)  The format for your next appointment:   In Person  Provider:   Kate Sable, MD   Other Instructions n/a

## 2020-03-08 ENCOUNTER — Telehealth: Payer: Self-pay | Admitting: Cardiovascular Disease

## 2020-03-08 NOTE — Telephone Encounter (Signed)
3 m fu needed per checkout 03/05/20 [facility]

## 2020-03-16 ENCOUNTER — Telehealth: Payer: Self-pay

## 2020-03-16 NOTE — Telephone Encounter (Signed)
Pt called asking to speak to Dr Ronnald Ramp, stating he is Dr Ronnald Ramp' cousin. 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 I need a medical expert to state in court that eating a high protein diet can make you fail a breathalyzer." I explained to the pt that he is not a patient here and Dr Ronnald Ramp is not allowed to see family members. He kept stating he just wanted a consultation with [facility]. I looked him up and our office manager found out he is established with [facility]. I told him that Dr Ronnald Ramp said she has not heard about a high protein diet causing you to fail a breathalyzer test. He seemed to get frustrated stating, "It's documented and there has been several cases like this." I explained that a dietician or nutritionist would be more appropriate for this and he said he could call an endocrinologist. Therefore, I offered him [place] telephone number to endocrinology. He stated "I can look that up, I don't need you to do that."

## 2020-04-26 ENCOUNTER — Other Ambulatory Visit: Payer: Self-pay

## 2020-04-26 MED ORDER — METOPROLOL SUCCINATE ER 25 MG PO TB24: 25.0000 mg | ORAL_TABLET | Freq: Every day | ORAL | 0 refills | Status: DC

## 2020-05-17 ENCOUNTER — Other Ambulatory Visit: Payer: Self-pay

## 2020-05-17 ENCOUNTER — Encounter: Payer: Self-pay | Admitting: Internal Medicine

## 2020-05-17 DIAGNOSIS — E1165 Type 2 diabetes mellitus with hyperglycemia: Secondary | ICD-10-CM

## 2020-05-17 MED ORDER — METFORMIN HCL ER 500 MG PO TB24: ORAL_TABLET | ORAL | 1 refills | Status: DC

## 2020-05-27 ENCOUNTER — Ambulatory Visit: Payer: Self-pay

## 2020-05-27 ENCOUNTER — Emergency Department (HOSPITAL_COMMUNITY): Payer: 59

## 2020-05-27 ENCOUNTER — Emergency Department (HOSPITAL_COMMUNITY)
Admission: EM | Admit: 2020-05-27 | Discharge: 2020-05-27 | Disposition: A | Discharge status: Home / Self Care | Payer: 59 | Attending: Emergency Medicine | Admitting: Emergency Medicine

## 2020-05-27 ENCOUNTER — Other Ambulatory Visit: Payer: Self-pay

## 2020-05-27 ENCOUNTER — Encounter (HOSPITAL_COMMUNITY): Payer: Self-pay | Admitting: Emergency Medicine

## 2020-05-27 ENCOUNTER — Ambulatory Visit: Payer: No Typology Code available for payment source | Admitting: Cardiology

## 2020-05-27 DIAGNOSIS — F17210 Nicotine dependence, cigarettes, uncomplicated: Secondary | ICD-10-CM | POA: Insufficient documentation

## 2020-05-27 DIAGNOSIS — M47892 Other spondylosis, cervical region: Secondary | ICD-10-CM | POA: Insufficient documentation

## 2020-05-27 DIAGNOSIS — M47812 Spondylosis without myelopathy or radiculopathy, cervical region: Secondary | ICD-10-CM

## 2020-05-27 DIAGNOSIS — Z7951 Long term (current) use of inhaled steroids: Secondary | ICD-10-CM | POA: Diagnosis not present

## 2020-05-27 DIAGNOSIS — R202 Paresthesia of skin: Secondary | ICD-10-CM | POA: Diagnosis present

## 2020-05-27 DIAGNOSIS — J45909 Unspecified asthma, uncomplicated: Secondary | ICD-10-CM | POA: Diagnosis not present

## 2020-05-27 DIAGNOSIS — Z7984 Long term (current) use of oral hypoglycemic drugs: Secondary | ICD-10-CM | POA: Insufficient documentation

## 2020-05-27 DIAGNOSIS — M5412 Radiculopathy, cervical region: Secondary | ICD-10-CM | POA: Diagnosis not present

## 2020-05-27 DIAGNOSIS — I2510 Atherosclerotic heart disease of native coronary artery without angina pectoris: Secondary | ICD-10-CM | POA: Insufficient documentation

## 2020-05-27 DIAGNOSIS — J449 Chronic obstructive pulmonary disease, unspecified: Secondary | ICD-10-CM | POA: Diagnosis not present

## 2020-05-27 DIAGNOSIS — E119 Type 2 diabetes mellitus without complications: Secondary | ICD-10-CM | POA: Diagnosis not present

## 2020-05-27 DIAGNOSIS — Z7982 Long term (current) use of aspirin: Secondary | ICD-10-CM | POA: Diagnosis not present

## 2020-05-27 HISTORY — DX: Other spondylosis with myelopathy, cervical region: M47.12

## 2020-05-27 LAB — CBG MONITORING, ED: Glucose-Capillary: 144 mg/dL — ABNORMAL HIGH (ref 70–99)

## 2020-05-27 MED ORDER — HYDROCODONE-ACETAMINOPHEN 5-325 MG PO TABS: 1.0000 | ORAL_TABLET | ORAL | 0 refills | Status: DC | PRN

## 2020-05-27 MED ORDER — GABAPENTIN 300 MG PO CAPS
ORAL_CAPSULE | ORAL | 0 refills | Status: DC
Start: 2020-05-27 — End: 2024-05-20

## 2020-05-27 MED ORDER — GABAPENTIN 300 MG PO CAPS
ORAL_CAPSULE | ORAL | 0 refills | Status: DC
Start: 2020-05-27 — End: 2020-05-27

## 2020-05-27 NOTE — Discharge Instructions (Signed)
Use the medication prescribed to help you with your symptoms.  As discussed your MRI is stable today with no new or worsening spondylosis or new findings that would suggest an emergent surgical problem.  However if your symptoms persist or worsen I recommend following up with either your primary MD or the orthopedic specialist you have seen in the past at [facility].  If you are interested in obtaining a more local specialist you may consider evaluation by Dr. Christella Noa who is in [place].  I have provided his information.

## 2020-05-27 NOTE — ED Provider Notes (Signed)
[facility] EMERGENCY DEPARTMENT Provider Note   CSN: [id number] Arrival date & time: 05/27/20  1415     History Chief Complaint  Patient presents with  . Arm Pain    Joel Page is a 49 y.o. male with a history of known cervical spondylosis, has been seen in the past by it neurosurgery with [facility], had been offered surgical intervention but patient had declined, presenting with bilateral hand tingling and numbness which is been present since Sunday.  He reports having occasional episodes of right sided tingling with overuse which usually resolves after several hours.  On Sunday he was moving 60 pound bags of decorative stones by slitting them over his shoulder, and the next day he had his current symptoms.  In the past his symptoms have improved with a course of gabapentin.  He denies weakness in his arms or hands, but has dropped objects secondary loss of sensation. He also endorses problems sleeping the past 3 nights due to the chronic tingling sensation.  He denies neck pain specifically, but reports a tightness along his bilateral neck.   Per patient's chart, underwent MRI May 2017 revealing for cervical spondylosis, including significant degenerative disc disease causing impingement at the C3-4, 4- 5 and 5- 6 levels, lesser at C2-3 and C6-7.  The history is provided by the patient.       Past Medical History:  Diagnosis Date  . Allergy   . Asthma   . Cervical spondylitic cord compression   . COPD (chronic obstructive pulmonary disease) ([place]) 12/2019   probable, not officially diagnosed, but being evaluated by pulmonology  . Coronary artery disease   . Costochondritis    history of multiple episodes  . COVID-19    patient reports having COVID-19 in February 2020  . DDD (degenerative disc disease)   . Diabetes mellitus   . Insomnia   . Lumbar stenosis   . 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 History of leukocytosis 09/30/2019  . Multiple closed fractures of ribs of right side 06/30/2019  . Esophageal dysphagia 06/30/2019  . Cervical stenosis of spinal canal 08/15/2016  . Hyperlipidemia LDL goal <100 07/04/2016  . Obesity 06/29/2016  . Back pain at L4-L5 level 04/07/2016  . Cough 03/30/2016  . Medication monitoring encounter 03/30/2016  . COPD suggested by initial evaluation ([place]) 12/22/2015  . 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  . Diabetes mellitus type 2, uncontrolled ([place]) 05/11/2015  . Current smoker 05/11/2015  . Hemorrhoids, internal 05/11/2015  . Arthralgia of hip 05/11/2015  . 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  . Restless leg 05/11/2015  . Trochanteric bursitis of right hip 06/17/2013  . Sciatica 03/19/2012  . Lumbar degenerative disc disease 03/19/2012    Past Surgical History:  Procedure Laterality Date  . EXCISIONAL HEMORRHOIDECTOMY  2012  . ORIF FEMUR FRACTURE  1989  . SPHINCTEROTOMY    . TOOTH EXTRACTION         Family History  Problem Relation Age of Onset  . Lung cancer Mother   . Dementia Father   . COPD Father   . Prostate cancer Father   . Diabetes Brother   . Cancer Maternal Grandfather     Social History   Tobacco Use  . Smoking status: Current Some Day Smoker    Packs/day: 2.00    Types: Cigarettes  . Smokeless  tobacco: Current User  . Tobacco comment: 0.5 currently- 11/04/2019  Vaping Use  . Vaping Use: Former  . Devices: Tried and did not like it.  Substance Use Topics  . Alcohol use: Yes    Alcohol/week: 0.0 standard drinks  . Drug use: Never    Home Medications Prior to Admission medications   Medication Sig Start Date End Date Taking? Authorizing Provider  albuterol (VENTOLIN HFA) 108 (90 Base) MCG/ACT inhaler Inhale 2-4 puffs by mouth every 4 hours as needed for wheezing, cough, and/or shortness of breath 09/02/19   Hubbard Hartshorn, FNP  aspirin 81  MG EC tablet Take 81 mg by mouth daily. Swallow whole.    [provider]  atorvastatin (LIPITOR) 40 MG tablet Take 1 tablet (40 mg total) by mouth daily at 6 PM. 02/02/20 05/02/20  Kate Sable, MD  Cinnamon 500 MG capsule Take 1,000 mg by mouth daily.    [provider]  Coenzyme Q10 (CO Q 10 PO) Take by mouth. Taking 1 capsule daily    [provider]  Fluticasone-Umeclidin-Vilant (TRELEGY ELLIPTA) 100-62.5-25 MCG/INH AEPB Inhale 1 puff into the lungs daily. Patient taking differently: Inhale 1 puff into the lungs daily. occassionally 12/18/19   Tyler Pita, MD  gabapentin (NEURONTIN) 300 MG capsule Take one capsule by mouth on day 1, then 1 capsule twice daily on day 2, then 1 capsule three times daily thereafter. 05/27/20   Evalee Jefferson, PA-C  Garlic 10 MG CAPS Take by mouth daily.     [provider]  HYDROcodone-acetaminophen (NORCO/VICODIN) 5-325 MG tablet Take 1 tablet by mouth every 4 (four) hours as needed for moderate pain. 05/27/20   Evalee Jefferson, PA-C  isosorbide mononitrate (IMDUR) 30 MG 24 hr tablet Take 1 tablet (30 mg total) by mouth daily. 03/05/20 06/03/20  Kate Sable, MD  metFORMIN (GLUCOPHAGE-XR) 500 MG 24 hr tablet Take 2 tablets once daily 05/17/20   Towanda Malkin, MD  metoprolol succinate (TOPROL XL) 25 MG 24 hr tablet Take 1 tablet (25 mg total) by mouth daily. 04/26/20   Kate Sable, MD  nitroGLYCERIN (NITROSTAT) 0.4 MG SL tablet Place 1 tablet (0.4 mg total) under the tongue every 5 (five) minutes as needed for chest pain. 12/18/19   Tyler Pita, MD  Omega-3 Fatty Acids (FISH OIL) 1000 MG CAPS Take by mouth daily.     [provider]  QUERCETIN PO Take by mouth in the morning and at bedtime.    [provider]  Turmeric 500 MG TABS Take 500 mg by mouth daily.    [provider]    Allergies    Topiramate, Trazodone, Baclofen, and Diclofenac sodium  Review of Systems   Review  of Systems  Constitutional: Negative for chills and fever.  HENT: Negative for congestion and sore throat.   Eyes: Negative.   Respiratory: Negative for chest tightness and shortness of breath.   Cardiovascular: Negative for chest pain.  Gastrointestinal: Negative for abdominal pain and nausea.  Genitourinary: Negative.   Musculoskeletal: Negative for arthralgias, joint swelling and neck pain.  Skin: Negative.  Negative for rash and wound.  Neurological: Positive for numbness. Negative for dizziness, weakness, light-headedness and headaches.  Psychiatric/Behavioral: Negative.     Physical Exam Updated Vital Signs BP 111/71   Pulse (!) 59   Temp 98.8 F (37.1 C) (Oral)   Resp 18   Ht 5\' 9"  (1.753 m)   Wt 104.3 kg   SpO2 99%  BMI 33.97 kg/m   Physical Exam Vitals and nursing note reviewed.  Constitutional:      Appearance: He is well-developed.  HENT:     Head: Normocephalic and atraumatic.  Eyes:     Conjunctiva/sclera: Conjunctivae normal.  Cardiovascular:     Rate and Rhythm: Normal rate and regular rhythm.     Heart sounds: Normal heart sounds.  Pulmonary:     Effort: Pulmonary effort is normal.     Breath sounds: Normal breath sounds. No wheezing.  Abdominal:     General: Bowel sounds are normal.     Palpations: Abdomen is soft.     Tenderness: There is no abdominal tenderness.  Musculoskeletal:        General: Normal range of motion.     Cervical back: Normal range of motion.  Skin:    General: Skin is warm and dry.     Capillary Refill: Capillary refill takes less than 2 seconds.  Neurological:     Mental Status: He is alert and oriented to person, place, and time.     Sensory: Sensory deficit present.     Motor: Motor function is intact.     Coordination: Rapid alternating movements normal.     Deep Tendon Reflexes:     Reflex Scores:      Bicep reflexes are 1+ on the right side and 1+ on the left side.    Comments: Decreased sensation to fine touch  bilateral hands and fingers.  Lesser so on forearms.  Full and equal grip strength.     ED Results / Procedures / Treatments   Labs (all labs ordered are listed, but only abnormal results are displayed) Labs Reviewed  CBG MONITORING, ED - Abnormal; Notable for the following components:      Result Value   Glucose-Capillary 144 (*)    All other components within normal limits    EKG None  Radiology MR Cervical Spine Wo Contrast  Result Date: 05/27/2020 CLINICAL DATA:  Bilateral arm numbness and tingling. EXAM: MRI CERVICAL SPINE WITHOUT CONTRAST TECHNIQUE: Multiplanar, multisequence MR imaging of the cervical spine was performed. No intravenous contrast was administered. COMPARISON:  MRI cervical spine dated Mar 20, 2016. FINDINGS: Alignment: Physiologic. Vertebrae: No fracture, evidence of discitis, or bone lesion. Unchanged C3 due Cord: Normal signal and morphology. Posterior Fossa, vertebral arteries, paraspinal tissues: Negative. Disc levels: C2-C3: Unchanged mild disc bulging and left facet uncovertebral hypertrophy. Unchanged mild left neuroforaminal stenosis. No spinal canal or right neuroforaminal stenosis. C3-C4: Unchanged mild disc bulging and moderate right and mild left uncovertebral hypertrophy. Unchanged mild spinal canal stenosis with severe right and moderate to severe left neuroforaminal stenosis. C4-C5: Unchanged mild disc bulging with superimposed small central disc protrusion. Unchanged left greater than right uncovertebral hypertrophy. Unchanged moderate spinal canal stenosis and severe left neuroforaminal stenosis. No right neuroforaminal stenosis. C5-C6: Unchanged mild disc bulging with superimposed left paracentral disc protrusion with flattening the left ventral cord. Unchanged mild bilateral uncovertebral hypertrophy. Unchanged moderate spinal canal stenosis with severe left and moderate right neuroforaminal stenosis. C6-C7: Unchanged mild disc bulging with borderline mild  canal stenosis. No neuroforaminal stenosis. C7-T1: Unchanged minimal disc bulging and mild right uncovertebral hypertrophy. No stenosis. IMPRESSION: 1. Multilevel cervical spondylosis, overall similar to prior study. Unchanged moderate spinal canal stenosis at C4-C5 and C5-C6. 2. Unchanged multilevel severe neuroforaminal stenosis as described above. Electronically Signed   By: Titus Dubin M.D.   On: 05/27/2020 16:33    Procedures Procedures (including critical care  time)  Medications Ordered in ED Medications - No data to display  ED Course  I have reviewed the triage vital signs and the nursing notes.  Pertinent labs & imaging results that were available during my care of the patient were reviewed by me and considered in my medical decision making (see chart for details).    MDM Rules/Calculators/A&P                          Imaging reviewed and is reassuring with no acute changes in comparison to prior MRI.  He apparently has bilateral upper extremity radiculopathy but without any surgical emergent findings.  He reports that in the past gabapentin has been very helpful in controlling these episodes.  This was prescribed for him.  He was also prescribed a short course of hydrocodone after review of the [place] narcotic database.  We also discussed heat therapy, activity as tolerated.  Plan follow-up with his PCP and/or his orthopedic specialist in [facility].  He states it has been a number of years since he has seen this provider.  He was also given a local resource with Dr. Christella Noa of neurosurgery if he chooses to establish care with someone closer.  Return precautions were outlined.  Of note, after paperwork was printed and prescription sent, patient requested [facility] [place] [facility] [place]  This was done and patient was advised to pick up his prescriptions here in [place]. Final Clinical Impression(s) / ED Diagnoses Final  diagnoses:  Cervical radiculopathy  Cervical spondylosis    Rx / DC Orders ED Discharge Orders         Ordered    gabapentin (NEURONTIN) 300 MG capsule  Status:  Discontinued     Reprint     05/27/20 1724    HYDROcodone-acetaminophen (NORCO/VICODIN) 5-325 MG tablet  Every 4 hours PRN,   Status:  Discontinued     Reprint     05/27/20 1724    HYDROcodone-acetaminophen (NORCO/VICODIN) 5-325 MG tablet  Every 4 hours PRN,   Status:  Discontinued     Reprint     05/27/20 1724    gabapentin (NEURONTIN) 300 MG capsule     Discontinue  Reprint     05/27/20 1736    HYDROcodone-acetaminophen (NORCO/VICODIN) 5-325 MG tablet  Every 4 hours PRN     Discontinue  Reprint     05/27/20 1736           Evalee Jefferson, PA-C 05/27/20 2337    Wyvonnia Dusky, MD 05/28/20 [phone]

## 2020-05-27 NOTE — Telephone Encounter (Signed)
Pt. Reports he has had tingling in both arms x 2 days. Is not sleeping at night with this. Np pain. Reports "I was told years ago I need neck surgery." No availability in the practice today per Melissa. Pt. Reports he will go to ED.  Reason for Disposition  [1] Numbness or tingling in one or both hands AND [2] is a chronic symptom (recurrent or ongoing AND present > 4 weeks)  Answer Assessment - Initial Assessment Questions 1. SYMPTOM: "What is the main symptom you are concerned about?" (e.g., weakness, numbness)    Tingling both arms 2. ONSET: "When did this start?" (minutes, hours, days; while sleeping)     2 days 3. LAST NORMAL: "When was the last time you were normal (no symptoms)?"     2 days ago 4. PATTERN "Does this come and go, or has it been constant since it started?"  "Is it present now?"     Constant 5. CARDIAC SYMPTOMS: "Have you had any of the following symptoms: chest pain, difficulty breathing, palpitations?"     No 6. NEUROLOGIC SYMPTOMS: "Have you had any of the following symptoms: headache, dizziness, vision loss, double vision, changes in speech, unsteady on your feet?"     Not sleeping 7. OTHER SYMPTOMS: "Do you have any other symptoms?"     Neck making popping noises 8. PREGNANCY: "Is there any chance you are pregnant?" "When was your last menstrual period?"     n/a  Protocols used: NEUROLOGIC DEFICIT-A-AH

## 2020-05-27 NOTE — ED Triage Notes (Signed)
Pt c/o numbness and tingling to bilateral arms.

## 2020-06-01 ENCOUNTER — Encounter: Payer: Self-pay | Admitting: Internal Medicine

## 2020-06-07 ENCOUNTER — Other Ambulatory Visit: Payer: Self-pay | Admitting: Internal Medicine

## 2020-06-07 DIAGNOSIS — G629 Polyneuropathy, unspecified: Secondary | ICD-10-CM

## 2020-06-14 LAB — LEAD, BLOOD (ADULT >= 16 YRS): Lead: 1 ug/dL (ref <5)

## 2020-07-19 ENCOUNTER — Encounter: Payer: Self-pay | Admitting: Internal Medicine

## 2020-09-14 ENCOUNTER — Encounter: Payer: Self-pay | Admitting: Internal Medicine

## 2020-10-06 ENCOUNTER — Ambulatory Visit (INDEPENDENT_AMBULATORY_CARE_PROVIDER_SITE_OTHER): Payer: 59 | Admitting: Internal Medicine

## 2020-10-06 ENCOUNTER — Encounter: Payer: Self-pay | Admitting: Internal Medicine

## 2020-10-06 ENCOUNTER — Other Ambulatory Visit: Payer: Self-pay

## 2020-10-06 VITALS — BP 150/90 | HR 86 | Temp 98.6°F | Resp 16 | Ht 69.0 in | Wt 240.7 lb

## 2020-10-06 DIAGNOSIS — K068 Other specified disorders of gingiva and edentulous alveolar ridge: Secondary | ICD-10-CM

## 2020-10-06 NOTE — Progress Notes (Signed)
Patient ID: Joel Page, male    DOB: [birth year], 49 y.o.   MRN: [id number]  PCP: Towanda Malkin, MD  Chief Complaint  Patient presents with  . Mouth Lesions    Subjective:   Joel Page is a 49 y.o. male, presents to clinic with CC of the following:  Chief Complaint  Patient presents with  . Mouth Lesions    HPI:  Patient is a 49 year old male Last visit with me was in March 2021. Follows up today with concerns with mouth lesion.  He noted over a week ago, he felt a tooth ache type discomfort in his lower right back gum area, and he noted it was painful and sensitive to touch.  It became more painful and he applied Orajel topically fairly liberally to help.  He could feel the lump in this area on the lower gum on the inside, and question if the felt underneath his jaw a small lump as well when he palpated.  He denied any fevers.  Had minimal nausea couple times but no vomiting.  He notes it does feel a little better recent past since has been applying that [place] product topically. He does continue to smoke, although notes he is trying to quit. He does occasionally check his blood pressure at home when he gets headaches, and after he had Covid, he noted it was quite labile with his readings.  Does not check it routinely.  Patient Active Problem List   Diagnosis Date Noted  . History of leukocytosis 09/30/2019  . Multiple closed fractures of ribs of right side 06/30/2019  . Esophageal dysphagia 06/30/2019  . Cervical stenosis of spinal canal 08/15/2016  . Hyperlipidemia LDL goal <100 07/04/2016  . Obesity 06/29/2016  . Back pain at L4-L5 level 04/07/2016  . Cough 03/30/2016  . Medication monitoring encounter 03/30/2016  . COPD suggested by initial evaluation ([place]) 12/22/2015  . 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  . Diabetes mellitus type 2, uncontrolled ([place]) 05/11/2015  . Current smoker 05/11/2015  . Hemorrhoids, internal  05/11/2015  . Arthralgia of hip 05/11/2015  . 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  . Restless leg 05/11/2015  . Trochanteric bursitis of right hip 06/17/2013  . Sciatica 03/19/2012  . Lumbar degenerative disc disease 03/19/2012      Current Outpatient Medications:  .  albuterol (VENTOLIN HFA) 108 (90 Base) MCG/ACT inhaler, Inhale 2-4 puffs by mouth every 4 hours as needed for wheezing, cough, and/or shortness of breath, Disp: 18 g, Rfl: 1 .  aspirin 81 MG EC tablet, Take 81 mg by mouth daily. Swallow whole., Disp: , Rfl:  .  Cinnamon 500 MG capsule, Take 1,000 mg by mouth daily., Disp: , Rfl:  .  Fluticasone-Umeclidin-Vilant (TRELEGY ELLIPTA) 100-62.5-25 MCG/INH AEPB, Inhale 1 puff into the lungs daily. (Patient taking differently: Inhale 1 puff into the lungs daily. occassionally), Disp: 28 each, Rfl: 0 .  gabapentin (NEURONTIN) 300 MG capsule, Take one capsule by mouth on day 1, then 1 capsule twice daily on day 2, then 1 capsule three times daily thereafter., Disp: 60 capsule, Rfl: 0 .  Garlic 10 MG CAPS, Take by mouth daily. , Disp: , Rfl:  .  metFORMIN (GLUCOPHAGE-XR) 500 MG 24 hr tablet, Take 2 tablets once daily, Disp: 180 tablet, Rfl: 1 .  nitroGLYCERIN (NITROSTAT) 0.4 MG SL tablet, Place 1 tablet (0.4 mg total) under the tongue every 5 (five) minutes as needed for chest pain., Disp: 25  tablet, Rfl: 0 .  Omega-3 Fatty Acids (FISH OIL) 1000 MG CAPS, Take by mouth daily. , Disp: , Rfl:  .  QUERCETIN PO, Take by mouth in the morning and at bedtime., Disp: , Rfl:  .  Turmeric 500 MG TABS, Take 500 mg by mouth daily., Disp: , Rfl:  .  atorvastatin (LIPITOR) 40 MG tablet, Take 1 tablet (40 mg total) by mouth daily at 6 PM., Disp: 30 tablet, Rfl: 4 .  Coenzyme Q10 (CO Q 10 PO), Take by mouth. Taking 1 capsule daily (Patient not taking: Reported on 10/06/2020), Disp: , Rfl:  .  HYDROcodone-acetaminophen (NORCO/VICODIN) 5-325 MG tablet, Take 1 tablet by mouth every 4  (four) hours as needed for moderate pain. (Patient not taking: Reported on 10/06/2020), Disp: 15 tablet, Rfl: 0 .  isosorbide mononitrate (IMDUR) 30 MG 24 hr tablet, Take 1 tablet (30 mg total) by mouth daily., Disp: 30 tablet, Rfl: 6 .  metoprolol succinate (TOPROL XL) 25 MG 24 hr tablet, Take 1 tablet (25 mg total) by mouth daily. (Patient not taking: Reported on 10/06/2020), Disp: 30 tablet, Rfl: 0   Allergies  Allergen Reactions  . Topiramate Other (See Comments)    White light in peripheral vision  . Trazodone Other (See Comments)    Makes tingling & numbness in hands & arms worse; causes an erection.  . Baclofen Itching  . Diclofenac Sodium Other (See Comments)    Raised blood sugar      Past Surgical History:  Procedure Laterality Date  . EXCISIONAL HEMORRHOIDECTOMY  2012  . ORIF FEMUR FRACTURE  1989  . SPHINCTEROTOMY    . TOOTH EXTRACTION       Family History  Problem Relation Age of Onset  . Lung cancer Mother   . Dementia Father   . COPD Father   . Prostate cancer Father   . Diabetes Brother   . Cancer Maternal Grandfather      Social History   Tobacco Use  . Smoking status: Current Some Day Smoker    Packs/day: 2.00    Types: Cigarettes  . Smokeless tobacco: Current User  . Tobacco comment: 0.5 currently- 11/04/2019  Substance Use Topics  . Alcohol use: Yes    Alcohol/week: 0.0 standard drinks    With staff assistance, above reviewed with the patient today.  ROS: As per HPI, otherwise no specific complaints on a limited and focused system review   No results found for this or any previous visit (from the past 72 hour(s)).   PHQ2/9: Depression screen [facility] 2/9 10/06/2020 02/12/2020 09/30/2019 09/02/2019 06/30/2019  Decreased Interest 0 0 0 0 0  Down, Depressed, Hopeless 0 0 0 0 0  PHQ - 2 Score 0 0 0 0 0  Altered sleeping - 1 0 0 0  Tired, decreased energy - 0 0 0 0  Change in appetite - 0 0 0 0  Feeling bad or failure about yourself  - 0 0 0 0    Trouble concentrating - 0 0 0 0  Moving slowly or fidgety/restless - 0 0 0 0  Suicidal thoughts - 0 0 0 0  PHQ-9 Score - 1 0 0 0  Difficult doing work/chores - Not difficult at all Not difficult at all Not difficult at all Not difficult at all  Some recent data might be hidden   PHQ-2/9 Result is neg  Fall Risk: Fall Risk  10/06/2020 02/12/2020 09/30/2019 09/02/2019 06/30/2019  Falls in the past year? 0 0 0  0 0  Number falls in past yr: 0 0 0 0 0  Injury with Fall? 0 0 0 0 0  Risk for fall due to : - - - - -  Follow up - - Falls evaluation completed Falls evaluation completed Falls evaluation completed      Objective:   Vitals:   10/06/20 0956  BP: (!) 150/90  Pulse: 86  Resp: 16  Temp: 98.6 F (37 C)  TempSrc: Oral  SpO2: 95%  Weight: 240 lb 11.2 oz (109.2 kg)  Height: 5\' 9"  (1.753 m)    Body mass index is 35.55 kg/m. Recheck of his blood pressure after sitting in the office for several minutes by myself was 138/82 with a large adult cuff on the left Physical Exam   NAD, masked HEENT - [place]/AT, sclera anicteric,  conj - non-inj'ed, pharynx clear, a raised area was present on the right lower posterior gumline, and appears slightly whitish in color.  With palpating this area, it was mildly tender, and felt very firm, almost tooth like.  Some mild erythema noted in this area.  No discharge or purulence evident. Neck - supple, no marked adenopathy, no rigidity Car - RRR, not tachycardic Pulm- RR and effort normal at rest, Neuro/psychiatric - affect was not flat, appropriate with conversation  Alert with normal speech   Results for orders placed or performed in visit on 06/07/20  Lead, blood  Result Value Ref Range   Lead 1 <5 mcg/dL       Assessment & Plan:   1. Gum lesion Discussed with patient this felt like a possible tooth protruding from the inner gum in the distal right lower gum region, and he did note he had his wisdom teeth removed in the past. Felt the  best next step was to follow-up with his dentist, and they can do Panorex films which can help further assess.  Would not add an antibiotic presently as does not seem like a major infectious concern presently, although noted that can potentially arise, like an abscess, and does need to be further evaluated. I do feel that seeing the dentist initially is the best next step, and pending their input, may need further follow-up with an oral surgeon over time. He is a smoker and can increase risks of oral lesions that can be more concerning and presently await input from his dentist.  His initial blood pressure was slightly elevated, although on recheck by myself it was good at 138/82.  He can check blood pressures at home, and recommended continuing to monitor, and if is having elevated readings like obtained on the first check today, he needs to follow-up. I did note that increased pain can increase blood pressure some as well      Towanda Malkin, MD 10/06/20 10:18 AM

## 2020-10-06 NOTE — Patient Instructions (Signed)
Please call your dentist and make an appointment to be seen as we discussed today.

## 2021-01-04 ENCOUNTER — Encounter (HOSPITAL_COMMUNITY): Payer: Self-pay | Admitting: *Deleted

## 2021-01-04 ENCOUNTER — Other Ambulatory Visit: Payer: Self-pay

## 2021-01-04 ENCOUNTER — Emergency Department (HOSPITAL_COMMUNITY): Payer: Self-pay

## 2021-01-04 ENCOUNTER — Emergency Department (HOSPITAL_COMMUNITY)
Admission: EM | Admit: 2021-01-04 | Discharge: 2021-01-04 | Disposition: A | Discharge status: Home / Self Care | Payer: Self-pay | Attending: Emergency Medicine | Admitting: Emergency Medicine

## 2021-01-04 DIAGNOSIS — Z7951 Long term (current) use of inhaled steroids: Secondary | ICD-10-CM | POA: Insufficient documentation

## 2021-01-04 DIAGNOSIS — R112 Nausea with vomiting, unspecified: Secondary | ICD-10-CM | POA: Insufficient documentation

## 2021-01-04 DIAGNOSIS — F17210 Nicotine dependence, cigarettes, uncomplicated: Secondary | ICD-10-CM | POA: Insufficient documentation

## 2021-01-04 DIAGNOSIS — E11319 Type 2 diabetes mellitus with unspecified diabetic retinopathy without macular edema: Secondary | ICD-10-CM | POA: Insufficient documentation

## 2021-01-04 DIAGNOSIS — J449 Chronic obstructive pulmonary disease, unspecified: Secondary | ICD-10-CM | POA: Insufficient documentation

## 2021-01-04 DIAGNOSIS — R1031 Right lower quadrant pain: Secondary | ICD-10-CM | POA: Insufficient documentation

## 2021-01-04 DIAGNOSIS — Z7984 Long term (current) use of oral hypoglycemic drugs: Secondary | ICD-10-CM | POA: Insufficient documentation

## 2021-01-04 DIAGNOSIS — J45909 Unspecified asthma, uncomplicated: Secondary | ICD-10-CM | POA: Insufficient documentation

## 2021-01-04 DIAGNOSIS — Z79899 Other long term (current) drug therapy: Secondary | ICD-10-CM | POA: Insufficient documentation

## 2021-01-04 DIAGNOSIS — I2510 Atherosclerotic heart disease of native coronary artery without angina pectoris: Secondary | ICD-10-CM | POA: Insufficient documentation

## 2021-01-04 DIAGNOSIS — Z8616 Personal history of COVID-19: Secondary | ICD-10-CM | POA: Insufficient documentation

## 2021-01-04 LAB — CBC WITH DIFFERENTIAL/PLATELET
Abs Immature Granulocytes: 0.03 10*3/uL (ref 0.00–0.07)
Basophils Absolute: 0 10*3/uL (ref 0.0–0.1)
Basophils Relative: 0 %
Eosinophils Absolute: 0.1 10*3/uL (ref 0.0–0.5)
Eosinophils Relative: 1 %
HCT: 46.5 % (ref 39.0–52.0)
Hemoglobin: 14.8 g/dL (ref 13.0–17.0)
Immature Granulocytes: 0 %
Lymphocytes Relative: 17 %
Lymphs Abs: 1.9 10*3/uL (ref 0.7–4.0)
MCH: 28.8 pg (ref 26.0–34.0)
MCHC: 31.8 g/dL (ref 30.0–36.0)
MCV: 90.5 fL (ref 80.0–100.0)
Monocytes Absolute: 0.8 10*3/uL (ref 0.1–1.0)
Monocytes Relative: 7 %
Neutro Abs: 8.4 10*3/uL — ABNORMAL HIGH (ref 1.7–7.7)
Neutrophils Relative %: 75 %
Platelets: 199 10*3/uL (ref 150–400)
RBC: 5.14 MIL/uL (ref 4.22–5.81)
RDW: 14.3 % (ref 11.5–15.5)
WBC: 11.3 10*3/uL — ABNORMAL HIGH (ref 4.0–10.5)
nRBC: 0 % (ref 0.0–0.2)

## 2021-01-04 LAB — URINALYSIS, ROUTINE W REFLEX MICROSCOPIC
Bilirubin Urine: NEGATIVE
Glucose, UA: NEGATIVE mg/dL
Hgb urine dipstick: NEGATIVE
Ketones, ur: 5 mg/dL — AB
Leukocytes,Ua: NEGATIVE
Nitrite: NEGATIVE
Protein, ur: NEGATIVE mg/dL
Specific Gravity, Urine: 1.014 (ref 1.005–1.030)
pH: 6 (ref 5.0–8.0)

## 2021-01-04 LAB — COMPREHENSIVE METABOLIC PANEL
ALT: 18 U/L (ref 0–44)
AST: 13 U/L — ABNORMAL LOW (ref 15–41)
Albumin: 4 g/dL (ref 3.5–5.0)
Alkaline Phosphatase: 81 U/L (ref 38–126)
Anion gap: 8 (ref 5–15)
BUN: 11 mg/dL (ref 6–20)
CO2: 23 mmol/L (ref 22–32)
Calcium: 9.2 mg/dL (ref 8.9–10.3)
Chloride: 103 mmol/L (ref 98–111)
Creatinine, Ser: 0.78 mg/dL (ref 0.61–1.24)
GFR, Estimated: >60 mL/min (ref >60)
Glucose, Bld: 230 mg/dL — ABNORMAL HIGH (ref 70–99)
Potassium: 4.1 mmol/L (ref 3.5–5.1)
Sodium: 134 mmol/L — ABNORMAL LOW (ref 135–145)
Total Bilirubin: 0.5 mg/dL (ref 0.3–1.2)
Total Protein: 7 g/dL (ref 6.5–8.1)

## 2021-01-04 LAB — LIPASE, BLOOD: Lipase: 31 U/L (ref 11–51)

## 2021-01-04 MED ORDER — IOHEXOL 300 MG/ML  SOLN
100.0000 mL | Freq: Once | INTRAMUSCULAR | Status: AC | PRN
  Administered 2021-01-04: 100 mL via INTRAVENOUS

## 2021-01-04 MED ORDER — ONDANSETRON HCL 4 MG/2ML IJ SOLN
4.0000 mg | Freq: Once | INTRAMUSCULAR | Status: AC
  Administered 2021-01-04: 4 mg via INTRAVENOUS
  Filled 2021-01-04: qty 2

## 2021-01-04 MED ORDER — SODIUM CHLORIDE 0.9 % IV BOLUS
1000.0000 mL | Freq: Once | INTRAVENOUS | Status: AC
  Administered 2021-01-04: 1000 mL via INTRAVENOUS

## 2021-01-04 MED ORDER — MORPHINE SULFATE (PF) 4 MG/ML IV SOLN
4.0000 mg | Freq: Once | INTRAVENOUS | Status: AC
  Administered 2021-01-04: 4 mg via INTRAVENOUS
  Filled 2021-01-04: qty 1

## 2021-01-04 NOTE — ED Provider Notes (Signed)
[facility] EMERGENCY DEPARTMENT Provider Note  CSN: [id number] Arrival date & time: 01/04/21 0827    History Chief Complaint  Patient presents with  . Abdominal Pain    HPI  Joel Page is a 50 y.o. male with history of DM presents for evaluation of two days of intermittent sharp RLQ abdominal pain, occasional radiating into his back, associated with nausea and vomiting. Has been constipated recently. Denies any dysuria or hematuria.    Past Medical History:  Diagnosis Date  . Allergy   . Asthma   . Cervical spondylitic cord compression   . COPD (chronic obstructive pulmonary disease) ([place]) 12/2019   probable, not officially diagnosed, but being evaluated by pulmonology  . Coronary artery disease   . Costochondritis    history of multiple episodes  . COVID-19    patient reports having COVID-19 in February 2020  . DDD (degenerative disc disease)   . Diabetes mellitus   . Insomnia   . Lumbar stenosis   . 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 EXCISIONAL HEMORRHOIDECTOMY  2012  . ORIF FEMUR FRACTURE  1989  . SPHINCTEROTOMY    . TOOTH EXTRACTION      Family History  Problem Relation Age of Onset  . Lung cancer Mother   . Dementia Father   . COPD Father   . Prostate cancer Father   . Diabetes Brother   . Cancer Maternal Grandfather     Social History   Tobacco Use  . Smoking status: Current Some Day Smoker    Packs/day: 1.00    Types: Cigarettes  . Smokeless tobacco: Current User  Vaping Use  . Vaping Use: Former  . Devices: Tried and did not like it.  Substance Use Topics  . Alcohol use: Yes    Alcohol/week: 0.0 standard drinks  . Drug use: Never     Home Medications Prior to Admission medications   Medication Sig Start Date End Date Taking? Authorizing Provider  metFORMIN (GLUCOPHAGE-XR) 500 MG 24 hr tablet Take 2 tablets once daily 05/17/20  Yes Towanda Malkin, MD  nitroGLYCERIN (NITROSTAT) 0.4 MG SL tablet Place 1 tablet (0.4 mg total) under the tongue every 5 (five) minutes as needed for chest pain. 12/18/19  Yes Tyler Pita, MD  albuterol (VENTOLIN HFA) 108 (90 Base) MCG/ACT inhaler Inhale 2-4 puffs by mouth every 4 hours as needed for wheezing, cough, and/or shortness of breath Patient not taking: Reported on 01/04/2021 09/02/19   Hubbard Hartshorn, FNP  atorvastatin (LIPITOR) 40 MG tablet Take 1 tablet (40 mg total) by mouth daily at 6 PM. 02/02/20 05/02/20  Kate Sable, MD  Fluticasone-Umeclidin-Vilant (TRELEGY ELLIPTA) 100-62.5-25 MCG/INH AEPB Inhale 1 puff into the lungs daily. Patient not taking: Reported on 01/04/2021 12/18/19   Tyler Pita, MD  gabapentin (NEURONTIN) 300 MG capsule Take one capsule by mouth on day 1, then 1 capsule twice daily on day 2, then 1 capsule three times daily thereafter. Patient not taking: Reported on 01/04/2021 05/27/20   Evalee Jefferson, PA-C  isosorbide mononitrate (IMDUR) 30 MG 24 hr tablet Take 1 tablet (30 mg total) by mouth daily. 03/05/20 06/03/20  Kate Sable, MD  metoprolol succinate (TOPROL XL) 25 MG 24 hr tablet Take 1 tablet (25 mg total) by mouth daily. Patient not taking: No sig reported 04/26/20 01/04/21  Kate Sable, MD     Allergies  Topiramate, Trazodone, Baclofen, and Diclofenac sodium   Review of Systems   Review of Systems A comprehensive review of systems was completed and negative except as noted in HPI.    Physical Exam BP 107/64   Pulse 76   Temp 98 F (36.7 C) (Oral)   Resp 18   Ht 5\' 9"  (1.753 m)   Wt 104.8 kg   SpO2 96%   BMI 34.11 kg/m   Physical Exam Vitals and nursing note reviewed.  Constitutional:      Appearance: Normal appearance.  HENT:     Head: Normocephalic and atraumatic.     Nose: Nose normal.     Mouth/Throat:     Mouth: Mucous membranes are moist.  Eyes:     Extraocular Movements: Extraocular movements intact.      Conjunctiva/sclera: Conjunctivae normal.  Cardiovascular:     Rate and Rhythm: Normal rate.  Pulmonary:     Effort: Pulmonary effort is normal.     Breath sounds: Normal breath sounds.  Abdominal:     General: Abdomen is flat.     Palpations: Abdomen is soft.     Tenderness: There is abdominal tenderness in the right lower quadrant. There is guarding. Positive signs include McBurney's sign. Negative signs include Murphy's sign.  Musculoskeletal:        General: No swelling. Normal range of motion.     Cervical back: Neck supple.  Skin:    General: Skin is warm and dry.  Neurological:     General: No focal deficit present.     Mental Status: He is alert.  Psychiatric:        Mood and Affect: Mood normal.      ED Results / Procedures / Treatments   Labs (all labs ordered are listed, but only abnormal results are displayed) Labs Reviewed  COMPREHENSIVE METABOLIC PANEL - Abnormal; Notable for the following components:      Result Value   Sodium 134 (*)    Glucose, Bld 230 (*)    AST 13 (*)    All other components within normal limits  CBC WITH DIFFERENTIAL/PLATELET - Abnormal; Notable for the following components:   WBC 11.3 (*)    Neutro Abs 8.4 (*)    All other components within normal limits  URINALYSIS, ROUTINE W REFLEX MICROSCOPIC - Abnormal; Notable for the following components:   Ketones, ur 5 (*)    All other components within normal limits  LIPASE, BLOOD    EKG None  Radiology CT Abdomen Pelvis W Contrast  Result Date: 01/04/2021 CLINICAL DATA:  Acute right lower quadrant abdominal pain. EXAM: CT ABDOMEN AND PELVIS WITH CONTRAST TECHNIQUE: Multidetector CT imaging of the abdomen and pelvis was performed using the standard protocol following bolus administration of intravenous contrast. CONTRAST:  181mL OMNIPAQUE IOHEXOL 300 MG/ML  SOLN COMPARISON:  September 25, 2011. FINDINGS: Lower chest: No acute abnormality. Hepatobiliary: No focal liver abnormality is seen.  No gallstones, gallbladder wall thickening, or biliary dilatation. Pancreas: Unremarkable. No pancreatic ductal dilatation or surrounding inflammatory changes. Spleen: Normal in size without focal abnormality. Adrenals/Urinary Tract: Adrenal glands are unremarkable. Kidneys are normal, without renal calculi, focal lesion, or hydronephrosis. Bladder is unremarkable. Stomach/Bowel: Stomach is within normal limits. Appendix appears normal. No evidence of bowel wall thickening, distention, or inflammatory changes. Vascular/Lymphatic: No significant vascular findings are present. No enlarged abdominal or pelvic lymph nodes. Reproductive: Prostate is unremarkable. Other: No abdominal wall hernia or abnormality. No abdominopelvic ascites. Musculoskeletal: No acute or significant  osseous findings. IMPRESSION: No abnormality seen in the abdomen or pelvis. Electronically Signed   By: Marijo Conception M.D.   On: 01/04/2021 09:58    Procedures Procedures  Medications Ordered in the ED Medications  morphine 4 MG/ML injection 4 mg (4 mg Intravenous Given 01/04/21 0849)  ondansetron (ZOFRAN) injection 4 mg (4 mg Intravenous Given 01/04/21 0849)  sodium chloride 0.9 % bolus 1,000 mL (0 mLs Intravenous Stopped 01/04/21 0952)  iohexol (OMNIPAQUE) 300 MG/ML solution 100 mL (100 mLs Intravenous Contrast Given 01/04/21 0936)     MDM Rules/Calculators/A&P MDM Patient with RLQ pain and tenderness, concern for appendicitis on exam. Could also be kidney stone. Will check lab and send for CT. Morphine/zofran for symptom control.  ED Course  I have reviewed the triage vital signs and the nursing notes.  Pertinent labs & imaging results that were available during my care of the patient were reviewed by me and considered in my medical decision making (see chart for details).  Clinical Course as of 01/04/21 1331  Tue Jan 04, 2021  0851 CBC with mild leukocytosis, similar to previous.  [CS]  [phone] CMP with mild hyperglycemia,  Lipase is normal.  [CS]  0943 UA neg for infection or blood.  [CS]  0037 CT images and results reviewed and discussed with the patient. No signs of acute process to explain his pain. Advised to take a stool softener/laxative for possible constipation as a cause and follow up with PCP. RTED for any other concerns.  [CS]    Clinical Course User Index [CS] Truddie Hidden, MD    Final Clinical Impression(s) / ED Diagnoses Final diagnoses:  Right lower quadrant abdominal pain    Rx / DC Orders ED Discharge Orders    None       Truddie Hidden, MD 01/04/21 1331

## 2021-01-04 NOTE — ED Triage Notes (Signed)
Pt c/o RLQ pain, nausea, vomiting, thick mucus in his stool x 2 days. Denies diarrhea. Unknown fever.

## 2021-04-19 ENCOUNTER — Emergency Department (HOSPITAL_COMMUNITY)
Admission: EM | Admit: 2021-04-19 | Discharge: 2021-04-19 | Disposition: A | Discharge status: Home / Self Care | Payer: Self-pay | Attending: Emergency Medicine | Admitting: Emergency Medicine

## 2021-04-19 ENCOUNTER — Encounter (HOSPITAL_COMMUNITY): Payer: Self-pay | Admitting: *Deleted

## 2021-04-19 ENCOUNTER — Other Ambulatory Visit: Payer: Self-pay

## 2021-04-19 DIAGNOSIS — F17210 Nicotine dependence, cigarettes, uncomplicated: Secondary | ICD-10-CM | POA: Insufficient documentation

## 2021-04-19 DIAGNOSIS — J45909 Unspecified asthma, uncomplicated: Secondary | ICD-10-CM | POA: Insufficient documentation

## 2021-04-19 DIAGNOSIS — M542 Cervicalgia: Secondary | ICD-10-CM | POA: Insufficient documentation

## 2021-04-19 DIAGNOSIS — J449 Chronic obstructive pulmonary disease, unspecified: Secondary | ICD-10-CM | POA: Insufficient documentation

## 2021-04-19 DIAGNOSIS — I2510 Atherosclerotic heart disease of native coronary artery without angina pectoris: Secondary | ICD-10-CM | POA: Insufficient documentation

## 2021-04-19 DIAGNOSIS — Z7984 Long term (current) use of oral hypoglycemic drugs: Secondary | ICD-10-CM | POA: Insufficient documentation

## 2021-04-19 DIAGNOSIS — Z8616 Personal history of COVID-19: Secondary | ICD-10-CM | POA: Insufficient documentation

## 2021-04-19 DIAGNOSIS — E119 Type 2 diabetes mellitus without complications: Secondary | ICD-10-CM | POA: Insufficient documentation

## 2021-04-19 MED ORDER — CYCLOBENZAPRINE HCL 10 MG PO TABS
10.0000 mg | ORAL_TABLET | Freq: Two times a day (BID) | ORAL | 0 refills | Status: DC | PRN
Start: 2021-04-19 — End: 2023-09-06

## 2021-04-19 NOTE — ED Provider Notes (Signed)
[facility] EMERGENCY DEPARTMENT Provider Note   CSN: [id number] Arrival date & time: 04/19/21  1755     History Chief Complaint  Patient presents with  . Neck Pain    Joel Page is a 50 y.o. male.  HPI   Patient with significant medical history of cervical spondylitis, COPD, CAD AD, DDD, presents with chief complaint of neck pain.  Patient states pain has spontaneously resolved.  He states earlier today while he was driving he felt a sharp sensation in his left upper neck, rating down his spine, states it lasted proxy  few seconds then went away.  Then had another jolt again lasted few seconds and went away on its own.  He has no worsening paresthesias or weakness in his upper extremities, he denies headaches, change in vision, paresthesia or weakness of his upper or lower extremities.  Denies recent trauma to his neck, denies associated fevers or chills, denies IV drug use.  Patient currently has no complaints at this time.  Patient denies headaches, fevers, chills, shortness breath, chest pain, abdominal pain  Past Medical History:  Diagnosis Date  . Allergy   . Asthma   . Cervical spondylitic cord compression   . COPD (chronic obstructive pulmonary disease) ([place]) 12/2019   probable, not officially diagnosed, but being evaluated by pulmonology  . Coronary artery disease   . Costochondritis    history of multiple episodes  . COVID-19    patient reports having COVID-19 in February 2020  . DDD (degenerative disc disease)   . Diabetes mellitus   . Insomnia   . Lumbar stenosis   . Retinopathy due to secondary diabetes ([place]) 06/24/12   both eyes    Patient Active Problem List   Diagnosis Date Noted  . History of leukocytosis 09/30/2019  . Multiple closed fractures of ribs of right side 06/30/2019  . Esophageal dysphagia 06/30/2019  . Cervical stenosis of spinal canal 08/15/2016  . Hyperlipidemia LDL goal <100 07/04/2016  . Obesity 06/29/2016  . Back pain at L4-L5 level  04/07/2016  . Cough 03/30/2016  . Medication monitoring encounter 03/30/2016  . COPD suggested by initial evaluation ([place]) 12/22/2015  . 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  . Diabetes mellitus type 2, uncontrolled ([place]) 05/11/2015  . Current smoker 05/11/2015  . Hemorrhoids, internal 05/11/2015  . Arthralgia of hip 05/11/2015  . 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  . Restless leg 05/11/2015  . Trochanteric bursitis of right hip 06/17/2013  . Sciatica 03/19/2012  . Lumbar degenerative disc disease 03/19/2012    Past Surgical History:  Procedure Laterality Date  . EXCISIONAL HEMORRHOIDECTOMY  2012  . ORIF FEMUR FRACTURE  1989  . SPHINCTEROTOMY    . TOOTH EXTRACTION         Family History  Problem Relation Age of Onset  . Lung cancer Mother   . Dementia Father   . COPD Father   . Prostate cancer Father   . Diabetes Brother   . Cancer Maternal Grandfather     Social History   Tobacco Use  . Smoking status: Current Some Day Smoker    Packs/day: 1.00    Types: Cigarettes  . Smokeless tobacco: Current User  Vaping Use  . Vaping Use: Former  . Devices: Tried and did not like it.  Substance Use Topics  . Alcohol use: Yes    Alcohol/week: 0.0 standard drinks  . Drug use: Never    Home Medications Prior to Admission medications  Medication Sig Start Date End Date Taking? Authorizing Provider  cyclobenzaprine (FLEXERIL) 10 MG tablet Take 1 tablet (10 mg total) by mouth 2 (two) times daily as needed for muscle spasms. 04/19/21  Yes Marcello Fennel, PA-C  albuterol (VENTOLIN HFA) 108 (90 Base) MCG/ACT inhaler Inhale 2-4 puffs by mouth every 4 hours as needed for wheezing, cough, and/or shortness of breath Patient not taking: Reported on 01/04/2021 09/02/19   Hubbard Hartshorn, FNP  atorvastatin (LIPITOR) 40 MG tablet Take 1 tablet (40 mg total) by mouth daily at 6 PM. 02/02/20 05/02/20  Kate Sable, MD   Fluticasone-Umeclidin-Vilant (TRELEGY ELLIPTA) 100-62.5-25 MCG/INH AEPB Inhale 1 puff into the lungs daily. Patient not taking: Reported on 01/04/2021 12/18/19   Tyler Pita, MD  gabapentin (NEURONTIN) 300 MG capsule Take one capsule by mouth on day 1, then 1 capsule twice daily on day 2, then 1 capsule three times daily thereafter. Patient not taking: Reported on 01/04/2021 05/27/20   Evalee Jefferson, PA-C  isosorbide mononitrate (IMDUR) 30 MG 24 hr tablet Take 1 tablet (30 mg total) by mouth daily. 03/05/20 06/03/20  Kate Sable, MD  metFORMIN (GLUCOPHAGE-XR) 500 MG 24 hr tablet Take 2 tablets once daily 05/17/20   Towanda Malkin, MD  nitroGLYCERIN (NITROSTAT) 0.4 MG SL tablet Place 1 tablet (0.4 mg total) under the tongue every 5 (five) minutes as needed for chest pain. 12/18/19   Tyler Pita, MD  metoprolol succinate (TOPROL XL) 25 MG 24 hr tablet Take 1 tablet (25 mg total) by mouth daily. Patient not taking: No sig reported 04/26/20 01/04/21  Kate Sable, MD    Allergies    Topiramate, Trazodone, Baclofen, and Diclofenac sodium  Review of Systems   Review of Systems  Constitutional: Negative for chills and fever.  HENT: Negative for congestion.   Respiratory: Negative for shortness of breath.   Cardiovascular: Negative for chest pain.  Gastrointestinal: Negative for abdominal pain.  Genitourinary: Negative for enuresis.  Musculoskeletal: Positive for neck pain. Negative for back pain.  Skin: Negative for rash.  Neurological: Negative for dizziness and headaches.  Hematological: Does not bruise/bleed easily.    Physical Exam Updated Vital Signs BP 139/82 (BP Location: Right Arm)   Pulse 74   Temp 98.9 F (37.2 C) (Oral)   Resp 17   SpO2 97%   Physical Exam Vitals and nursing note reviewed.  Constitutional:      General: He is not in acute distress.    Appearance: He is not ill-appearing.  HENT:     Head: Normocephalic and atraumatic.     Nose:  No congestion.  Eyes:     Extraocular Movements: Extraocular movements intact.     Conjunctiva/sclera: Conjunctivae normal.     Pupils: Pupils are equal, round, and reactive to light.  Neck:     Comments: No torticollis present. Cardiovascular:     Rate and Rhythm: Normal rate and regular rhythm.     Pulses: Normal pulses.     Heart sounds: No murmur heard. No friction rub. No gallop.   Pulmonary:     Effort: No respiratory distress.     Breath sounds: No wheezing, rhonchi or rales.  Musculoskeletal:     Cervical back: Normal range of motion. No rigidity.     Right lower leg: No edema.     Left lower leg: No edema.     Comments: Patient has 5 of 5 strength, neurovascular intact in the upper lower extremities.  Spine was palpated  was nontender to palpation, no step-off deformities present.  Skin:    General: Skin is warm and dry.  Neurological:     Mental Status: He is alert.  Psychiatric:        Mood and Affect: Mood normal.     ED Results / Procedures / Treatments   Labs (all labs ordered are listed, but only abnormal results are displayed) Labs Reviewed - No data to display  EKG None  Radiology No results found.  Procedures Procedures   Medications Ordered in ED Medications - No data to display  ED Course  I have reviewed the triage vital signs and the nursing notes.  Pertinent labs & imaging results that were available during my care of the patient were reviewed by me and considered in my medical decision making (see chart for details).    MDM Rules/Calculators/A&P                         Initial impression-patient presents with neck pain that has spontaneously resolved.  He is alert, does not appear in acute distress, vital signs reassuring.  Work-up-due to well-appearing patient, benign physical exam, further lab or imaging ordered at this time.  Rule out-low suspicion for spinal cord abnormality or spinal fracture spine was palpated was nontender to  palpation, he has full range of motion in his upper and lower extremities, no torticollis noted.  Will defer imaging as there is no recent trauma to the area pain  resolved.  Low suspicion for aneurysm of the neck or head as he has no associated headaches, no focal deficits present my exam.  Low suspicion for meningitis as there is no meningeal sign noted.  Plan-  1.  Neck pain since resolved-suspect this is acute on chronic neck pain as he has spondylosis of the cervical spine MRI of neck 6 months ago was unremarkable.  Will provide patient a muscle laxer's, have him follow-up with neurosurgery for further evaluation.  Vital signs have remained stable, no indication for hospital admission.    Patient given at home care as well strict return precautions.  Patient verbalized that they understood agreed to said plan.   Final Clinical Impression(s) / ED Diagnoses Final diagnoses:  Neck pain    Rx / DC Orders ED Discharge Orders         Ordered    cyclobenzaprine (FLEXERIL) 10 MG tablet  2 times daily PRN        04/19/21 1850           Aron Baba 04/19/21 1919    Hayden Rasmussen, MD 04/20/21 1012

## 2021-04-19 NOTE — ED Triage Notes (Signed)
[place] stabbing pain in neck prior to arrival, no pain at present

## 2021-04-19 NOTE — Discharge Instructions (Addendum)
You have been seen here for neck pain.  I have given you prescription for a muscle relaxer please take as prescribed, please beware this medication can make you drowsy do not consume alcohol or operate heavy machinery when taking this medication.  I recommend taking over-the-counter pain medications like ibuprofen and/or Tylenol every 6 as needed.  Please follow dosage and on the back of bottle.  I also recommend applying heat to the area and stretching out the muscles as this will help decrease stiffness and pain.  I have given you information on exercises please follow.  Please follow-up with neurosurgery for further evaluation.  Come back to the emergency department if you develop chest pain, shortness of breath, severe abdominal pain, uncontrolled nausea, vomiting, diarrhea.

## 2021-04-27 ENCOUNTER — Encounter (HOSPITAL_COMMUNITY): Payer: Self-pay

## 2021-04-27 ENCOUNTER — Emergency Department (HOSPITAL_COMMUNITY)
Admission: EM | Admit: 2021-04-27 | Discharge: 2021-04-28 | Disposition: A | Discharge status: Home / Self Care | Payer: Self-pay | Attending: Emergency Medicine | Admitting: Emergency Medicine

## 2021-04-27 ENCOUNTER — Other Ambulatory Visit: Payer: Self-pay

## 2021-04-27 ENCOUNTER — Emergency Department (HOSPITAL_COMMUNITY): Payer: Self-pay

## 2021-04-27 DIAGNOSIS — R079 Chest pain, unspecified: Secondary | ICD-10-CM | POA: Insufficient documentation

## 2021-04-27 DIAGNOSIS — I2510 Atherosclerotic heart disease of native coronary artery without angina pectoris: Secondary | ICD-10-CM | POA: Insufficient documentation

## 2021-04-27 DIAGNOSIS — F17210 Nicotine dependence, cigarettes, uncomplicated: Secondary | ICD-10-CM | POA: Insufficient documentation

## 2021-04-27 DIAGNOSIS — J449 Chronic obstructive pulmonary disease, unspecified: Secondary | ICD-10-CM | POA: Insufficient documentation

## 2021-04-27 DIAGNOSIS — J45909 Unspecified asthma, uncomplicated: Secondary | ICD-10-CM | POA: Insufficient documentation

## 2021-04-27 DIAGNOSIS — E119 Type 2 diabetes mellitus without complications: Secondary | ICD-10-CM | POA: Insufficient documentation

## 2021-04-27 DIAGNOSIS — Z8616 Personal history of COVID-19: Secondary | ICD-10-CM | POA: Insufficient documentation

## 2021-04-27 LAB — CBC WITH DIFFERENTIAL/PLATELET
Abs Immature Granulocytes: 0.04 10*3/uL (ref 0.00–0.07)
Basophils Absolute: 0 10*3/uL (ref 0.0–0.1)
Basophils Relative: 0 %
Eosinophils Absolute: 0.1 10*3/uL (ref 0.0–0.5)
Eosinophils Relative: 1 %
HCT: 43 % (ref 39.0–52.0)
Hemoglobin: 13.9 g/dL (ref 13.0–17.0)
Immature Granulocytes: 0 %
Lymphocytes Relative: 24 %
Lymphs Abs: 2.7 10*3/uL (ref 0.7–4.0)
MCH: 29 pg (ref 26.0–34.0)
MCHC: 32.3 g/dL (ref 30.0–36.0)
MCV: 89.6 fL (ref 80.0–100.0)
Monocytes Absolute: 0.7 10*3/uL (ref 0.1–1.0)
Monocytes Relative: 6 %
Neutro Abs: 7.9 10*3/uL — ABNORMAL HIGH (ref 1.7–7.7)
Neutrophils Relative %: 69 %
Platelets: 185 10*3/uL (ref 150–400)
RBC: 4.8 MIL/uL (ref 4.22–5.81)
RDW: 14.4 % (ref 11.5–15.5)
WBC: 11.6 10*3/uL — ABNORMAL HIGH (ref 4.0–10.5)
nRBC: 0 % (ref 0.0–0.2)

## 2021-04-27 LAB — COMPREHENSIVE METABOLIC PANEL
ALT: 16 U/L (ref 0–44)
AST: 14 U/L — ABNORMAL LOW (ref 15–41)
Albumin: 3.7 g/dL (ref 3.5–5.0)
Alkaline Phosphatase: 75 U/L (ref 38–126)
Anion gap: 6 (ref 5–15)
BUN: 15 mg/dL (ref 6–20)
CO2: 26 mmol/L (ref 22–32)
Calcium: 8.4 mg/dL — ABNORMAL LOW (ref 8.9–10.3)
Chloride: 104 mmol/L (ref 98–111)
Creatinine, Ser: 0.69 mg/dL (ref 0.61–1.24)
GFR, Estimated: >60 mL/min (ref >60)
Glucose, Bld: 200 mg/dL — ABNORMAL HIGH (ref 70–99)
Potassium: 4 mmol/L (ref 3.5–5.1)
Sodium: 136 mmol/L (ref 135–145)
Total Bilirubin: 0.3 mg/dL (ref 0.3–1.2)
Total Protein: 6.8 g/dL (ref 6.5–8.1)

## 2021-04-27 LAB — TROPONIN I (HIGH SENSITIVITY): Troponin I (High Sensitivity): 2 ng/L (ref <18)

## 2021-04-27 MED ORDER — ALUM & MAG HYDROXIDE-SIMETH 200-200-20 MG/5ML PO SUSP
30.0000 mL | Freq: Once | ORAL | Status: AC
  Administered 2021-04-27: 30 mL via ORAL
  Filled 2021-04-27: qty 30

## 2021-04-27 NOTE — ED Triage Notes (Signed)
Pt to er, pt states that he is here for chest pain, states that he started to notice it about an hour ago.  Pt talking in full sentences, resps even and unlabored, pt states that he also got a little dizzy.

## 2021-04-28 LAB — TROPONIN I (HIGH SENSITIVITY): Troponin I (High Sensitivity): 3 ng/L (ref <18)

## 2021-04-28 NOTE — ED Provider Notes (Signed)
[facility] Emergency Department Provider Note MRN:  [id number]  Arrival date & time: 04/28/21     Chief Complaint   Chest Pain   History of Present Illness   Joel Page is a 50 y.o. year-old male with a history of COPD, diabetes presenting to the ED with chief complaint of chest pain.  Location: Right side of chest Duration: Several hours today Onset: Gradual Timing: Constant Description: Ache Severity: Mild Exacerbating/Alleviating Factors: Worse with certain positions or movements Associated Symptoms: Denies diaphoresis, no nausea vomiting, no shortness of breath   Review of Systems  A complete 10 system review of systems was obtained and all systems are negative except as noted in the HPI and PMH.   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[place]) 06/24/12   both eyes    Past Surgical History:  Procedure Laterality Date   EXCISIONAL HEMORRHOIDECTOMY  2012   ORIF FEMUR FRACTURE  1989   SPHINCTEROTOMY     TOOTH EXTRACTION      Family History  Problem Relation Age of Onset   Lung cancer Mother    Dementia Father    COPD Father    Prostate cancer Father    Diabetes Brother    Cancer Maternal Grandfather     Social History   Socioeconomic History   Marital status: Married    Spouse name: Anderson Malta   Number of children: 3   Years of education: Not on file   Highest education level: Some college, no degree  Occupational History    Employer: [employer]  CORRECTIONS  Tobacco Use   Smoking status: Some Days    Packs/day: 1.00    Pack years: 0.00    Types: Cigarettes   Smokeless tobacco: Current  Vaping Use   Vaping Use: Never used  Substance and Sexual Activity   Alcohol use: Not Currently    Alcohol/week: 0.0 standard drinks   Drug use: Never   Sexual activity: Yes    Partners: Female  Other Topics Concern   Not on file  Social History Narrative   Not on file   Social Determinants of Health   Financial Resource Strain: Not on file  Food Insecurity: Not on file  Transportation Needs: Not on file  Physical Activity: Not on file  Stress: Not on file  Social Connections: Not on file  Intimate Partner Violence: Not on file     Physical Exam   Vitals:   04/28/21 0000 04/28/21 0030  BP: 105/70 114/67  Pulse: 78 73  Resp: 12 13  Temp:    SpO2: 97% 94%    CONSTITUTIONAL: Well-appearing, NAD NEURO:  Alert and oriented x 3, no focal deficits EYES:  eyes equal and reactive ENT/NECK:  no LAD, no JVD CARDIO: Regular rate, well-perfused, normal S1 and S2 PULM:  CTAB no wheezing or rhonchi GI/GU:  normal bowel sounds, non-distended, non-tender MSK/SPINE:  No gross deformities, no edema  SKIN:  no rash, atraumatic PSYCH:  Appropriate speech and behavior  *Additional and/or pertinent findings included in MDM below  Diagnostic and Interventional Summary    EKG Interpretation  Date/Time:  Wednesday April 27 2021 22:24:04 EDT Ventricular Rate:  91 PR Interval:  168 QRS Duration: 96 QT Interval:  359 QTC Calculation: 442 R Axis:   16 Text Interpretation: Sinus rhythm Consider left atrial enlargement Confirmed by Gerlene Fee [phone]) on 04/27/2021 11:24:40 PM        Labs Reviewed  CBC WITH DIFFERENTIAL/PLATELET - Abnormal; Notable for the following components:      Result Value   WBC 11.6 (*)    Neutro Abs 7.9 (*)    All other components within normal limits  COMPREHENSIVE METABOLIC PANEL - Abnormal; Notable for the  following components:   Glucose, Bld 200 (*)    Calcium 8.4 (*)    AST 14 (*)    All other components within normal limits  TROPONIN I (HIGH SENSITIVITY)  TROPONIN I (HIGH SENSITIVITY)    DG Chest Port 1 View  Final Result      Medications  alum & mag hydroxide-simeth (MAALOX/MYLANTA) 200-200-20 MG/5ML suspension 30 mL (30 mLs Oral Given 04/27/21 2328)     Procedures  /  Critical Care Procedures  ED Course and Medical Decision Making  I have reviewed the triage vital signs, the nursing notes, and pertinent available records from the EMR.  Listed above are laboratory and imaging tests that I personally ordered, reviewed, and interpreted and then considered in my medical decision making (see below for details).  Suspect MSK cause of patient's pain.  Does have a few cardiovascular risk factors.  EKG is reassuring, troponin is negative.  No history of DVT or PE, no risk factors, no evidence of DVT, no hypoxia, doubt PE.  Patient remains comfortable, appropriate for discharge.       Barth Kirks. Sedonia Small, [place] [email or web address]  Final Clinical Impressions(s) / ED Diagnoses     ICD-10-CM   1. Chest pain, unspecified type  R07.9       ED Discharge Orders     None        Discharge Instructions Discussed with and Provided to Patient:   Discharge Instructions   None       Maudie Flakes, MD 04/28/21 [phone]

## 2021-06-22 ENCOUNTER — Other Ambulatory Visit: Payer: Self-pay

## 2021-06-22 ENCOUNTER — Emergency Department (HOSPITAL_COMMUNITY)
Admission: EM | Admit: 2021-06-22 | Discharge: 2021-06-22 | Disposition: A | Discharge status: Home / Self Care | Payer: Self-pay | Attending: Emergency Medicine | Admitting: Emergency Medicine

## 2021-06-22 ENCOUNTER — Encounter (HOSPITAL_COMMUNITY): Payer: Self-pay | Admitting: Emergency Medicine

## 2021-06-22 ENCOUNTER — Emergency Department (HOSPITAL_COMMUNITY): Payer: Self-pay

## 2021-06-22 DIAGNOSIS — T783XXA Angioneurotic edema, initial encounter: Secondary | ICD-10-CM | POA: Insufficient documentation

## 2021-06-22 DIAGNOSIS — Z7984 Long term (current) use of oral hypoglycemic drugs: Secondary | ICD-10-CM | POA: Insufficient documentation

## 2021-06-22 DIAGNOSIS — I10 Essential (primary) hypertension: Secondary | ICD-10-CM | POA: Insufficient documentation

## 2021-06-22 DIAGNOSIS — K118 Other diseases of salivary glands: Secondary | ICD-10-CM | POA: Insufficient documentation

## 2021-06-22 DIAGNOSIS — F17210 Nicotine dependence, cigarettes, uncomplicated: Secondary | ICD-10-CM | POA: Insufficient documentation

## 2021-06-22 DIAGNOSIS — I2510 Atherosclerotic heart disease of native coronary artery without angina pectoris: Secondary | ICD-10-CM | POA: Insufficient documentation

## 2021-06-22 DIAGNOSIS — J45909 Unspecified asthma, uncomplicated: Secondary | ICD-10-CM | POA: Insufficient documentation

## 2021-06-22 DIAGNOSIS — Z8616 Personal history of COVID-19: Secondary | ICD-10-CM | POA: Insufficient documentation

## 2021-06-22 DIAGNOSIS — J449 Chronic obstructive pulmonary disease, unspecified: Secondary | ICD-10-CM | POA: Insufficient documentation

## 2021-06-22 DIAGNOSIS — E119 Type 2 diabetes mellitus without complications: Secondary | ICD-10-CM | POA: Insufficient documentation

## 2021-06-22 DIAGNOSIS — R220 Localized swelling, mass and lump, head: Secondary | ICD-10-CM

## 2021-06-22 LAB — CBC WITH DIFFERENTIAL/PLATELET
Abs Immature Granulocytes: 0.02 10*3/uL (ref 0.00–0.07)
Basophils Absolute: 0 10*3/uL (ref 0.0–0.1)
Basophils Relative: 0 %
Eosinophils Absolute: 0.1 10*3/uL (ref 0.0–0.5)
Eosinophils Relative: 1 %
HCT: 48.4 % (ref 39.0–52.0)
Hemoglobin: 15.2 g/dL (ref 13.0–17.0)
Immature Granulocytes: 0 %
Lymphocytes Relative: 24 %
Lymphs Abs: 2.5 10*3/uL (ref 0.7–4.0)
MCH: 28.6 pg (ref 26.0–34.0)
MCHC: 31.4 g/dL (ref 30.0–36.0)
MCV: 91 fL (ref 80.0–100.0)
Monocytes Absolute: 0.6 10*3/uL (ref 0.1–1.0)
Monocytes Relative: 6 %
Neutro Abs: 7.1 10*3/uL (ref 1.7–7.7)
Neutrophils Relative %: 69 %
Platelets: 190 10*3/uL (ref 150–400)
RBC: 5.32 MIL/uL (ref 4.22–5.81)
RDW: 14.5 % (ref 11.5–15.5)
WBC: 10.4 10*3/uL (ref 4.0–10.5)
nRBC: 0 % (ref 0.0–0.2)

## 2021-06-22 LAB — BASIC METABOLIC PANEL
Anion gap: 6 (ref 5–15)
BUN: 15 mg/dL (ref 6–20)
CO2: 26 mmol/L (ref 22–32)
Calcium: 8.2 mg/dL — ABNORMAL LOW (ref 8.9–10.3)
Chloride: 105 mmol/L (ref 98–111)
Creatinine, Ser: 0.7 mg/dL (ref 0.61–1.24)
GFR, Estimated: >60 mL/min (ref >60)
Glucose, Bld: 166 mg/dL — ABNORMAL HIGH (ref 70–99)
Potassium: 3.8 mmol/L (ref 3.5–5.1)
Sodium: 137 mmol/L (ref 135–145)

## 2021-06-22 MED ORDER — IOHEXOL 300 MG/ML  SOLN
75.0000 mL | Freq: Once | INTRAMUSCULAR | Status: AC | PRN
  Administered 2021-06-22: 75 mL via INTRAVENOUS

## 2021-06-22 MED ORDER — DIPHENHYDRAMINE HCL 50 MG/ML IJ SOLN
25.0000 mg | Freq: Once | INTRAMUSCULAR | Status: AC
  Administered 2021-06-22: 25 mg via INTRAVENOUS
  Filled 2021-06-22: qty 1

## 2021-06-22 MED ORDER — PENICILLIN V POTASSIUM 500 MG PO TABS: 500.0000 mg | ORAL_TABLET | Freq: Four times a day (QID) | ORAL | 0 refills | Status: DC

## 2021-06-22 MED ORDER — FAMOTIDINE IN NACL 20-0.9 MG/50ML-% IV SOLN
20.0000 mg | Freq: Once | INTRAVENOUS | Status: AC
  Administered 2021-06-22: 20 mg via INTRAVENOUS
  Filled 2021-06-22: qty 50

## 2021-06-22 MED ORDER — METHYLPREDNISOLONE SODIUM SUCC 125 MG IJ SOLR
125.0000 mg | Freq: Once | INTRAMUSCULAR | Status: AC
  Administered 2021-06-22: 125 mg via INTRAVENOUS
  Filled 2021-06-22: qty 2

## 2021-06-22 MED ORDER — PREDNISONE 50 MG PO TABS: ORAL_TABLET | ORAL | 0 refills | Status: DC

## 2021-06-22 MED ORDER — AMOXICILLIN-POT CLAVULANATE 875-125 MG PO TABS: 1.0000 | ORAL_TABLET | Freq: Two times a day (BID) | ORAL | 0 refills | Status: DC

## 2021-06-22 MED ORDER — PENICILLIN V POTASSIUM 250 MG PO TABS
500.0000 mg | ORAL_TABLET | Freq: Once | ORAL | Status: AC
  Administered 2021-06-22: 500 mg via ORAL
  Filled 2021-06-22: qty 2

## 2021-06-22 NOTE — Discharge Instructions (Addendum)
Take the antibiotics and steroids as prescribed.  Follow-up with your doctor.  Return to the ED immediately if you develop increased swelling in your mouth or tongue or lips.  Difficulty breathing difficulty swallowing, chest pain or any other concerns.

## 2021-06-22 NOTE — ED Notes (Signed)
Tolerating po

## 2021-06-22 NOTE — ED Notes (Addendum)
Patient removed IV  Said he didn't need it anymore

## 2021-06-22 NOTE — ED Triage Notes (Signed)
Pt states he had a bump under his tongue and that it busted and was very painful. Pt here for oral swelling related to this.

## 2021-06-22 NOTE — ED Provider Notes (Signed)
[facility] EMERGENCY DEPARTMENT Provider Note   CSN: [id number] Arrival date & time: 06/22/21  0231     History Chief Complaint  Patient presents with   Oral Swelling    Joel Page is a 50 y.o. male.  Patient with a history of hypertension and COPD presenting with pain and swelling underneath his tongue.  States there was a "cyst" underneath his tongue on the left side near the gingiva for the past 2 days.  This was painful.  He states it abruptly ruptured today while he was sleeping.  States there is some bleeding and a bad taste in his mouth.  Feels are still swelling underneath his tongue and is having difficulty swallowing and his voice is abnormal.  Denies any difficulty breathing.  Denies any chest pain.  No fever. States the lower dentition is painful. No swelling of the tongue or lips themselves.  Does not take any ACE inhibitor's.  The history is provided by the patient.      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   both eyes    Patient Active Problem List   Diagnosis Date Noted   History of leukocytosis 09/30/2019   Multiple closed fractures of ribs of right side 06/30/2019   Esophageal dysphagia 06/30/2019   Cervical stenosis of spinal canal 08/15/2016   Hyperlipidemia LDL goal <100 07/04/2016   Obesity 06/29/2016   Back pain at L4-L5 level 04/07/2016   Cough 03/30/2016   Medication monitoring encounter 03/30/2016   COPD suggested by initial evaluation ([place]) 12/22/2015   Cervical disc  disorder with radiculopathy 05/11/2015   Diabetes mellitus type 2, uncontrolled ([place]) 05/11/2015   Current smoker 05/11/2015   Hemorrhoids, internal 05/11/2015   Arthralgia of hip 05/11/2015   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   Restless leg 05/11/2015   Trochanteric bursitis of right hip 06/17/2013   Sciatica 03/19/2012   Lumbar degenerative disc disease 03/19/2012    Past Surgical History:  Procedure Laterality Date   EXCISIONAL HEMORRHOIDECTOMY  2012   ORIF FEMUR FRACTURE  1989   SPHINCTEROTOMY     TOOTH EXTRACTION         Family History  Problem Relation Age of Onset   Lung cancer Mother    Dementia Father    COPD Father    Prostate cancer Father    Diabetes Brother    Cancer Maternal Grandfather     Social History   Tobacco Use   Smoking status: Some Days    Packs/day: 1.00    Types: Cigarettes   Smokeless tobacco: Current  Vaping Use   Vaping Use: Never used  Substance Use Topics   Alcohol use: Not Currently    Alcohol/week: 0.0 standard drinks   Drug use: Never    Home Medications Prior to Admission medications   Medication Sig Start Date End Date Taking? Authorizing Provider  albuterol (VENTOLIN HFA) 108 (90 Base) MCG/ACT inhaler Inhale 2-4 puffs by  mouth every 4 hours as needed for wheezing, cough, and/or shortness of breath Patient not taking: Reported on 01/04/2021 09/02/19   Hubbard Hartshorn, FNP  atorvastatin (LIPITOR) 40 MG tablet Take 1 tablet (40 mg total) by mouth daily at 6 PM. 02/02/20 05/02/20  Kate Sable, MD  cyclobenzaprine (FLEXERIL) 10 MG tablet Take 1 tablet (10 mg total) by mouth 2 (two) times daily as needed for muscle spasms. 04/19/21   Marcello Fennel, PA-C  Fluticasone-Umeclidin-Vilant (TRELEGY ELLIPTA) 100-62.5-25 MCG/INH AEPB Inhale 1 puff into the lungs daily. Patient not taking: Reported on 01/04/2021 12/18/19   Tyler Pita, MD  gabapentin (NEURONTIN) 300 MG capsule Take one capsule by mouth  on day 1, then 1 capsule twice daily on day 2, then 1 capsule three times daily thereafter. Patient not taking: Reported on 01/04/2021 05/27/20   Evalee Jefferson, PA-C  isosorbide mononitrate (IMDUR) 30 MG 24 hr tablet Take 1 tablet (30 mg total) by mouth daily. 03/05/20 06/03/20  Kate Sable, MD  metFORMIN (GLUCOPHAGE-XR) 500 MG 24 hr tablet Take 2 tablets once daily 05/17/20   Towanda Malkin, MD  nitroGLYCERIN (NITROSTAT) 0.4 MG SL tablet Place 1 tablet (0.4 mg total) under the tongue every 5 (five) minutes as needed for chest pain. 12/18/19   Tyler Pita, MD  metoprolol succinate (TOPROL XL) 25 MG 24 hr tablet Take 1 tablet (25 mg total) by mouth daily. Patient not taking: No sig reported 04/26/20 01/04/21  Kate Sable, MD    Allergies    Topiramate, Trazodone, Baclofen, and Diclofenac sodium  Review of Systems   Review of Systems  Constitutional:  Negative for activity change, appetite change and fever.  HENT:  Positive for mouth sores, trouble swallowing and voice change. Negative for congestion.   Eyes:  Negative for visual disturbance.  Respiratory:  Negative for cough, chest tightness and shortness of breath.   Cardiovascular:  Negative for chest pain.  Gastrointestinal:  Negative for abdominal pain, nausea and vomiting.  Genitourinary:  Negative for dysuria and hematuria.  Musculoskeletal:  Negative for arthralgias and myalgias.  Skin:  Negative for rash.  Neurological:  Negative for dizziness, weakness and headaches.   all other systems are negative except as noted in the HPI and PMH.   Physical Exam Updated Vital Signs BP 124/74 (BP Location: Right Arm)   Pulse 80   Resp 18   Ht '5\' 9"'$  (1.753 m)   Wt 104.3 kg   SpO2 96%   BMI 33.97 kg/m   Physical Exam Vitals and nursing note reviewed.  Constitutional:      General: He is not in acute distress.    Appearance: He is well-developed.  HENT:     Head: Normocephalic and atraumatic.     Mouth/Throat:      Pharynx: No oropharyngeal exudate.     Comments: Controlling secretions, no distress, tongue and lips appear to be normal.  No edema in the oropharynx. Mild fullness sublingually on the left side that is tender.  There is overlying erythema.  No fluctuance. No obvious abscess. Eyes:     Conjunctiva/sclera: Conjunctivae normal.     Pupils: Pupils are equal, round, and reactive to light.  Neck:     Comments: No meningismus. Cardiovascular:     Rate and Rhythm: Normal rate and regular rhythm.     Heart sounds: Normal heart sounds. No murmur heard. Pulmonary:     Effort: Pulmonary effort is normal. No respiratory distress.     Breath  sounds: Normal breath sounds.  Abdominal:     Palpations: Abdomen is soft.     Tenderness: There is no abdominal tenderness. There is no guarding or rebound.  Musculoskeletal:        General: No tenderness. Normal range of motion.     Cervical back: Normal range of motion and neck supple.  Skin:    General: Skin is warm.  Neurological:     Mental Status: He is alert and oriented to person, place, and time.     Cranial Nerves: No cranial nerve deficit.     Motor: No abnormal muscle tone.     Coordination: Coordination normal.     Comments:  5/5 strength throughout. CN 2-12 intact.Equal grip strength.   Psychiatric:        Behavior: Behavior normal.    ED Results / Procedures / Treatments   Labs (all labs ordered are listed, but only abnormal results are displayed) Labs Reviewed  BASIC METABOLIC PANEL - Abnormal; Notable for the following components:      Result Value   Glucose, Bld 166 (*)    Calcium 8.2 (*)    All other components within normal limits  CBC WITH DIFFERENTIAL/PLATELET    EKG None  Radiology No results found.  Procedures Procedures   Medications Ordered in ED Medications  methylPREDNISolone sodium succinate (SOLU-MEDROL) 125 mg/2 mL injection 125 mg (has no administration in time range)  famotidine (PEPCID) IVPB 20 mg  premix (has no administration in time range)  diphenhydrAMINE (BENADRYL) injection 25 mg (has no administration in time range)    ED Course  I have reviewed the triage vital signs and the nursing notes.  Pertinent labs & imaging results that were available during my care of the patient were reviewed by me and considered in my medical decision making (see chart for details).    MDM Rules/Calculators/A&P                           Sublingual swelling, possibly dental abscess versus possible sialadenitis.  Angioedema is also considered but no tongue or lip swelling.  Patient given steroids as well as antihistamines  Observed in the ED with no deterioration of condition.  He is tolerating p.o.  Airway is intact.  No tongue or lip swelling. Favor likely dental abscess that ruptured of sialadenitis.  CT scan shows no fluid collections.  Angioedema seems less likely at this time.  Will treat with steroids as well as antibiotics.  Follow-up with PCP.  Patient advised to return to the ED if he develops any increased swelling, difficulty breathing, difficulty swallowing, tongue or lip swelling or any other concerns Final Clinical Impression(s) / ED Diagnoses Final diagnoses:  Swelling of sublingual gland    Rx / DC Orders ED Discharge Orders     None        Doss Cybulski, Annie Main, MD 06/22/21 [phone]

## 2021-08-05 ENCOUNTER — Other Ambulatory Visit: Payer: Self-pay

## 2021-08-05 ENCOUNTER — Encounter (HOSPITAL_COMMUNITY): Payer: Self-pay | Admitting: Emergency Medicine

## 2021-08-05 DIAGNOSIS — R009 Unspecified abnormalities of heart beat: Secondary | ICD-10-CM | POA: Insufficient documentation

## 2021-08-05 DIAGNOSIS — Z5321 Procedure and treatment not carried out due to patient leaving prior to being seen by health care provider: Secondary | ICD-10-CM | POA: Insufficient documentation

## 2021-08-05 NOTE — ED Triage Notes (Signed)
Pt states he was checking his BP earlier and his machine stated that it "detected an irregular heartbeat" so pt came to ED for evaluation.

## 2021-08-06 ENCOUNTER — Emergency Department (HOSPITAL_COMMUNITY)
Admission: EM | Admit: 2021-08-06 | Discharge: 2021-08-06 | Disposition: A | Discharge status: Home / Self Care | Payer: Self-pay | Attending: Emergency Medicine | Admitting: Emergency Medicine

## 2021-08-06 NOTE — ED Notes (Signed)
Called x 1. NO answer

## 2021-08-06 NOTE — ED Notes (Signed)
Called x 2. No answer 

## 2021-12-02 ENCOUNTER — Emergency Department (HOSPITAL_COMMUNITY)
Admission: EM | Admit: 2021-12-02 | Discharge: 2021-12-02 | Disposition: A | Discharge status: Home / Self Care | Payer: Self-pay | Attending: Emergency Medicine | Admitting: Emergency Medicine

## 2021-12-02 ENCOUNTER — Encounter (HOSPITAL_COMMUNITY): Payer: Self-pay | Admitting: *Deleted

## 2021-12-02 DIAGNOSIS — Z79899 Other long term (current) drug therapy: Secondary | ICD-10-CM | POA: Insufficient documentation

## 2021-12-02 DIAGNOSIS — D2330 Other benign neoplasm of skin of unspecified part of face: Secondary | ICD-10-CM

## 2021-12-02 DIAGNOSIS — L720 Epidermal cyst: Secondary | ICD-10-CM | POA: Insufficient documentation

## 2021-12-02 DIAGNOSIS — E119 Type 2 diabetes mellitus without complications: Secondary | ICD-10-CM | POA: Insufficient documentation

## 2021-12-02 DIAGNOSIS — Z7984 Long term (current) use of oral hypoglycemic drugs: Secondary | ICD-10-CM | POA: Insufficient documentation

## 2021-12-02 MED ORDER — DOXYCYCLINE HYCLATE 100 MG PO CAPS: 100.0000 mg | ORAL_CAPSULE | Freq: Two times a day (BID) | ORAL | 0 refills | Status: DC

## 2021-12-02 NOTE — ED Provider Notes (Signed)
[facility] EMERGENCY DEPARTMENT Provider Note   CSN: [id number] Arrival date & time: 12/02/21  1744     History Chief Complaint  Patient presents with   Abscess    Joel Page is a 51 y.o. male with h/o DM presents to the ED for evaluation of an area of left sided facial swelling and erythema for the past 2 days. He denies any cough or cold symptoms, fever, dental pain. He denies any drainage from the wound.  The patient reported he tried garlic for pain but denies any Tylenol or ibuprofen use.  Medical history includes diabetes and a heart murmur.  No surgical history.  Daily medications include metformin.  No known drug allergies.  Half pack per day smoker.  She denies any EtOH or drug use.   Abscess     Home Medications Prior to Admission medications   Medication Sig Start Date End Date Taking? Authorizing Provider  doxycycline (VIBRAMYCIN) 100 MG capsule Take 1 capsule (100 mg total) by mouth 2 (two) times daily. 12/02/21  Yes Sherrell Puller, PA-C  albuterol (VENTOLIN HFA) 108 (90 Base) MCG/ACT inhaler Inhale 2-4 puffs by mouth every 4 hours as needed for wheezing, cough, and/or shortness of breath Patient not taking: Reported on 01/04/2021 09/02/19   Hubbard Hartshorn, FNP  amoxicillin-clavulanate (AUGMENTIN) 875-125 MG tablet Take 1 tablet by mouth every 12 (twelve) hours. 06/22/21   Rancour, Annie Main, MD  atorvastatin (LIPITOR) 40 MG tablet Take 1 tablet (40 mg total) by mouth daily at 6 PM. 02/02/20 05/02/20  Kate Sable, MD  cyclobenzaprine (FLEXERIL) 10 MG tablet Take 1 tablet (10 mg total) by mouth 2 (two) times daily as needed for muscle spasms. 04/19/21   Marcello Fennel, PA-C  Fluticasone-Umeclidin-Vilant (TRELEGY ELLIPTA) 100-62.5-25 MCG/INH AEPB Inhale 1 puff into the lungs daily. Patient not taking: Reported on 01/04/2021 12/18/19   Tyler Pita, MD  gabapentin (NEURONTIN) 300 MG capsule Take one capsule by mouth on day 1, then 1 capsule twice daily on day 2, then  1 capsule three times daily thereafter. Patient not taking: Reported on 01/04/2021 05/27/20   Evalee Jefferson, PA-C  isosorbide mononitrate (IMDUR) 30 MG 24 hr tablet Take 1 tablet (30 mg total) by mouth daily. 03/05/20 06/03/20  Kate Sable, MD  metFORMIN (GLUCOPHAGE-XR) 500 MG 24 hr tablet Take 2 tablets once daily 05/17/20   Towanda Malkin, MD  nitroGLYCERIN (NITROSTAT) 0.4 MG SL tablet Place 1 tablet (0.4 mg total) under the tongue every 5 (five) minutes as needed for chest pain. 12/18/19   Tyler Pita, MD  predniSONE (DELTASONE) 50 MG tablet 1 tablet PO daily 06/22/21   Rancour, Annie Main, MD  metoprolol succinate (TOPROL XL) 25 MG 24 hr tablet Take 1 tablet (25 mg total) by mouth daily. Patient not taking: No sig reported 04/26/20 01/04/21  Kate Sable, MD      Allergies    Topiramate, Trazodone, Baclofen, and Diclofenac sodium    Review of Systems   Review of Systems  HENT:  Negative for congestion, dental problem and rhinorrhea.   Skin:  Positive for color change and wound.   Physical Exam Updated Vital Signs BP (!) 141/82 (BP Location: Right Arm)    Pulse 86    Temp 98.4 F (36.9 C) (Oral)    Resp 17    Wt 105.2 kg    SpO2 93%    BMI 34.25 kg/m  Physical Exam Vitals and nursing note reviewed.  Constitutional:  General: He is not in acute distress.    Appearance: Normal appearance. He is not toxic-appearing.  HENT:     Head: Normocephalic and atraumatic.     Nose: Nose normal.     Mouth/Throat:     Mouth: Mucous membranes are moist.     Comments: Poor dentition with multiple missing teeth.  No induration or fluctuance palpated. Eyes:     General: No scleral icterus. Pulmonary:     Effort: Pulmonary effort is normal. No respiratory distress.  Skin:    General: Skin is dry.     Findings: No rash.     Comments: Quarter size area of induration noted to the left cheek.  No centralization.  No active discharge.  No fluctuance.  Likely consistent with a cyst.   Neurological:     General: No focal deficit present.     Mental Status: He is alert. Mental status is at baseline.  Psychiatric:        Mood and Affect: Mood normal.    ED Results / Procedures / Treatments   Labs (all labs ordered are listed, but only abnormal results are displayed) Labs Reviewed - No data to display  EKG None  Radiology No results found.  Procedures Procedures  Mild hypertension, otherwise unremarkable.  Medications Ordered in ED Medications - No data to display  ED Course/ Medical Decision Making/ A&P                           Medical Decision Making  51 year old male presents emergency department for evaluation of left cheek inflammation and redness.  Physical exam consistent more with a cyst as there is no fluctuance or centralization to the wound.  This is likely a inflamed cyst that will need excision by dermatology or general surgery.  We will place patient on doxycycline for coverage.  Referral for general surgery placed.  Recommended the patient try warm compresses on the area daily that discussed that this will ultimately need to be fixed by removal of the cyst sac by either general surgery or dermatology.  Strict return precautions discussed.  Patient agrees to plan.  Patient is stable being discharged home in good condition.  Final Clinical Impression(s) / ED Diagnoses Final diagnoses:  Cyst, dermoid, face    Rx / DC Orders ED Discharge Orders          Ordered    doxycycline (VIBRAMYCIN) 100 MG capsule  2 times daily        12/02/21 2005              Sherrell Puller, [place] 12/03/21 0108    Noemi Chapel, MD 12/03/21 2018

## 2021-12-02 NOTE — ED Notes (Signed)
Pt walking out to hallway, crying, difficult to redirect.

## 2021-12-02 NOTE — Discharge Instructions (Addendum)
You were seen here today for evaluation of you cyst on your face. You will need to see a dermatologist or general surgeon for the removal of this. I have placed you on doxycycline, an antibiotic to help with this. Conitinue to apply warm compresses to the area. I have attached a PCP clinic to your discharge paperwork to follow up with as well. If you have any concern, new or worsening symptoms, please return to the ED for re-evaluation.

## 2021-12-02 NOTE — ED Triage Notes (Signed)
Called x 1 for room assignment, not available

## 2021-12-02 NOTE — ED Triage Notes (Signed)
Red painful area on face x 2 days

## 2023-04-22 ENCOUNTER — Emergency Department (HOSPITAL_COMMUNITY)
Admission: EM | Admit: 2023-04-22 | Discharge: 2023-04-23 | Disposition: A | Discharge status: Home / Self Care | Payer: Self-pay | Attending: Emergency Medicine | Admitting: Emergency Medicine

## 2023-04-22 ENCOUNTER — Other Ambulatory Visit: Payer: Self-pay

## 2023-04-22 DIAGNOSIS — Z7984 Long term (current) use of oral hypoglycemic drugs: Secondary | ICD-10-CM | POA: Insufficient documentation

## 2023-04-22 DIAGNOSIS — E119 Type 2 diabetes mellitus without complications: Secondary | ICD-10-CM | POA: Insufficient documentation

## 2023-04-22 DIAGNOSIS — H538 Other visual disturbances: Secondary | ICD-10-CM | POA: Insufficient documentation

## 2023-04-22 DIAGNOSIS — R824 Acetonuria: Secondary | ICD-10-CM | POA: Insufficient documentation

## 2023-04-22 LAB — CBG MONITORING, ED: Glucose-Capillary: 142 mg/dL — ABNORMAL HIGH (ref 70–99)

## 2023-04-22 NOTE — ED Triage Notes (Signed)
Patient states not on any diabetic meds at this point. Use supplements to help control levels. Patient states that he is not urinating as much.

## 2023-04-22 NOTE — ED Triage Notes (Signed)
Patient complains of blurry vision that started today around 1530. Patient stated that it did improved from early. Patient has hx DM2 states that blood sugar was elevated 152 and check ketones "that was reading moderate of 40".

## 2023-04-23 LAB — BASIC METABOLIC PANEL
Anion gap: 10 (ref 5–15)
BUN: 19 mg/dL (ref 6–20)
CO2: 21 mmol/L — ABNORMAL LOW (ref 22–32)
Calcium: 8.5 mg/dL — ABNORMAL LOW (ref 8.9–10.3)
Chloride: 104 mmol/L (ref 98–111)
Creatinine, Ser: 0.82 mg/dL (ref 0.61–1.24)
GFR, Estimated: >60 mL/min (ref >60)
Glucose, Bld: 136 mg/dL — ABNORMAL HIGH (ref 70–99)
Potassium: 3.7 mmol/L (ref 3.5–5.1)
Sodium: 135 mmol/L (ref 135–145)

## 2023-04-23 LAB — CBC WITH DIFFERENTIAL/PLATELET
Abs Immature Granulocytes: 0.03 10*3/uL (ref 0.00–0.07)
Basophils Absolute: 0 10*3/uL (ref 0.0–0.1)
Basophils Relative: 0 %
Eosinophils Absolute: 0.1 10*3/uL (ref 0.0–0.5)
Eosinophils Relative: 1 %
HCT: 46.5 % (ref 39.0–52.0)
Hemoglobin: 15.5 g/dL (ref 13.0–17.0)
Immature Granulocytes: 0 %
Lymphocytes Relative: 23 %
Lymphs Abs: 2.4 10*3/uL (ref 0.7–4.0)
MCH: 29.5 pg (ref 26.0–34.0)
MCHC: 33.3 g/dL (ref 30.0–36.0)
MCV: 88.6 fL (ref 80.0–100.0)
Monocytes Absolute: 0.8 10*3/uL (ref 0.1–1.0)
Monocytes Relative: 8 %
Neutro Abs: 7.1 10*3/uL (ref 1.7–7.7)
Neutrophils Relative %: 68 %
Platelets: 183 10*3/uL (ref 150–400)
RBC: 5.25 MIL/uL (ref 4.22–5.81)
RDW: 14 % (ref 11.5–15.5)
WBC: 10.4 10*3/uL (ref 4.0–10.5)
nRBC: 0 % (ref 0.0–0.2)

## 2023-04-23 LAB — URINALYSIS, ROUTINE W REFLEX MICROSCOPIC
Bilirubin Urine: NEGATIVE
Glucose, UA: NEGATIVE mg/dL
Hgb urine dipstick: NEGATIVE
Ketones, ur: 5 mg/dL — AB
Leukocytes,Ua: NEGATIVE
Nitrite: NEGATIVE
Protein, ur: NEGATIVE mg/dL
Specific Gravity, Urine: 1.012 (ref 1.005–1.030)
pH: 6 (ref 5.0–8.0)

## 2023-04-23 LAB — LIPASE, BLOOD: Lipase: 33 U/L (ref 11–51)

## 2023-04-23 NOTE — ED Notes (Signed)
No complaints of weakness of dizziness

## 2023-04-23 NOTE — ED Notes (Signed)
Patient states that he has fruity breath.

## 2023-04-23 NOTE — ED Provider Notes (Signed)
[place] EMERGENCY DEPARTMENT AT [facility]  Provider Note  CSN: [id number] Arrival date & time: 04/22/23 2334  History Chief Complaint  Patient presents with   Blurred Vision    Joel Page is a 52 y.o. male with history of diet controlled DM not currently on any medications reports he has recently started a Keto diet about 2 weeks ago.He has noticed more labile blood sugars than usual for him. Earlier in the day he reports having some blurry vision, he describes it as foggy but also double vision at a distance. Near vision was not as affected. He noticed a fruity smell to his breath and so he got an OTC ketone tester which he reports came back with a reading of 40. He became concerned about DKA and so he came to the ED for evaluation. He reports his blurry vision is improved now. Not currently seeing double.    Home Medications Prior to Admission medications   Medication Sig Start Date End Date Taking? Authorizing Provider  albuterol (VENTOLIN HFA) 108 (90 Base) MCG/ACT inhaler Inhale 2-4 puffs by mouth every 4 hours as needed for wheezing, cough, and/or shortness of breath Patient not taking: Reported on 01/04/2021 09/02/19   Doren Custard, FNP  amoxicillin-clavulanate (AUGMENTIN) 875-125 MG tablet Take 1 tablet by mouth every 12 (twelve) hours. 06/22/21   Rancour, Jeannett Senior, MD  atorvastatin (LIPITOR) 40 MG tablet Take 1 tablet (40 mg total) by mouth daily at 6 PM. 02/02/20 05/02/20  Debbe Odea, MD  cyclobenzaprine (FLEXERIL) 10 MG tablet Take 1 tablet (10 mg total) by mouth 2 (two) times daily as needed for muscle spasms. 04/19/21   Carroll Sage, PA-C  doxycycline (VIBRAMYCIN) 100 MG capsule Take 1 capsule (100 mg total) by mouth 2 (two) times daily. 12/02/21   Achille Rich, PA-C  Fluticasone-Umeclidin-Vilant (TRELEGY ELLIPTA) 100-62.5-25 MCG/INH AEPB Inhale 1 puff into the lungs daily. Patient not taking: Reported on 01/04/2021 12/18/19   Salena Saner, MD   gabapentin (NEURONTIN) 300 MG capsule Take one capsule by mouth on day 1, then 1 capsule twice daily on day 2, then 1 capsule three times daily thereafter. Patient not taking: Reported on 01/04/2021 05/27/20   Burgess Amor, PA-C  isosorbide mononitrate (IMDUR) 30 MG 24 hr tablet Take 1 tablet (30 mg total) by mouth daily. 03/05/20 06/03/20  Debbe Odea, MD  metFORMIN (GLUCOPHAGE-XR) 500 MG 24 hr tablet Take 2 tablets once daily 05/17/20   Jamelle Haring, MD  nitroGLYCERIN (NITROSTAT) 0.4 MG SL tablet Place 1 tablet (0.4 mg total) under the tongue every 5 (five) minutes as needed for chest pain. 12/18/19   Salena Saner, MD  predniSONE (DELTASONE) 50 MG tablet 1 tablet PO daily 06/22/21   Rancour, Jeannett Senior, MD  metoprolol succinate (TOPROL XL) 25 MG 24 hr tablet Take 1 tablet (25 mg total) by mouth daily. Patient not taking: No sig reported 04/26/20 01/04/21  Debbe Odea, MD     Allergies    Topiramate, Trazodone, Baclofen, and Diclofenac sodium   Review of Systems   Review of Systems Please see HPI for pertinent positives and negatives  Physical Exam BP 108/71   Pulse 84   Temp 98.5 F (36.9 C) (Oral)   Resp 13   Ht 5\' 9"  (1.753 m)   Wt 93 kg   SpO2 98%   BMI 30.27 kg/m   Physical Exam Vitals and nursing note reviewed.  Constitutional:      Appearance: Normal appearance.  HENT:     Head: Normocephalic and atraumatic.     Nose: Nose normal.     Mouth/Throat:     Mouth: Mucous membranes are moist.  Eyes:     Extraocular Movements: Extraocular movements intact.     Conjunctiva/sclera: Conjunctivae normal.     Pupils: Pupils are equal, round, and reactive to light.     Comments: Grossly normal visual acuity  Cardiovascular:     Rate and Rhythm: Normal rate.  Pulmonary:     Effort: Pulmonary effort is normal.     Breath sounds: Normal breath sounds.  Abdominal:     General: Abdomen is flat.     Palpations: Abdomen is soft.     Tenderness: There is no  abdominal tenderness.  Musculoskeletal:        General: No swelling. Normal range of motion.     Cervical back: Neck supple.  Skin:    General: Skin is warm and dry.  Neurological:     General: No focal deficit present.     Mental Status: He is alert and oriented to person, place, and time.     Cranial Nerves: No cranial nerve deficit.     Sensory: No sensory deficit.     Motor: No weakness.     Comments: No visual field cuts  Psychiatric:        Mood and Affect: Mood normal.     ED Results / Procedures / Treatments   EKG EKG Interpretation  Date/Time:  Sunday April 22 2023 23:55:18 EDT Ventricular Rate:  90 PR Interval:  160 QRS Duration: 89 QT Interval:  370 QTC Calculation: 453 R Axis:   18 Text Interpretation: Sinus rhythm Probable left atrial enlargement Anteroseptal infarct, old No significant change since last tracing Confirmed by Susy Frizzle [phone]) on 04/23/2023 1:35:10 AM  Procedures Procedures  Medications Ordered in the ED Medications - No data to display  Initial Impression and Plan  Patient here with nonspecific vision changes, has a normal exam now. He also noted urine ketones on an OTC test after being on a ketogenic diet for the last two weeks. Labs done here show only small ketones in urine. CBC is normal. BMP without signs of DKA. Patient reassured he is not in DKA. Discussed the mechanism by which a ketogenic diet leads to weight loss and the importance of balancing his metabolic needs with his diet goals. He was encouraged to follow up with [facility] for a comprehensive eye exam and PCP follow up, RTED for any other concerns.    ED Course       MDM Rules/Calculators/A&P Medical Decision Making Given presenting complaint, I considered that admission might be necessary. After review of results from ED lab and/or imaging studies, admission to the hospital is not indicated at this time.    Problems Addressed: Blurry vision: acute illness or  injury Ketonuria: acute illness or injury  Amount and/or Complexity of Data Reviewed Labs: ordered. Decision-making details documented in ED Course.  Risk Decision regarding hospitalization.     Final Clinical Impression(s) / ED Diagnoses Final diagnoses:  Blurry vision  Ketonuria    Rx / DC Orders ED Discharge Orders     None        Pollyann Savoy, MD 04/23/23 [phone]

## 2023-04-24 ENCOUNTER — Other Ambulatory Visit: Payer: Self-pay

## 2023-04-24 ENCOUNTER — Emergency Department (HOSPITAL_COMMUNITY)
Admission: EM | Admit: 2023-04-24 | Discharge: 2023-04-24 | Disposition: A | Discharge status: Home / Self Care | Payer: Self-pay | Attending: Emergency Medicine | Admitting: Emergency Medicine

## 2023-04-24 ENCOUNTER — Encounter (HOSPITAL_COMMUNITY): Payer: Self-pay | Admitting: *Deleted

## 2023-04-24 DIAGNOSIS — R197 Diarrhea, unspecified: Secondary | ICD-10-CM | POA: Insufficient documentation

## 2023-04-24 DIAGNOSIS — Z7984 Long term (current) use of oral hypoglycemic drugs: Secondary | ICD-10-CM | POA: Insufficient documentation

## 2023-04-24 DIAGNOSIS — R739 Hyperglycemia, unspecified: Secondary | ICD-10-CM

## 2023-04-24 DIAGNOSIS — E1165 Type 2 diabetes mellitus with hyperglycemia: Secondary | ICD-10-CM | POA: Insufficient documentation

## 2023-04-24 LAB — BASIC METABOLIC PANEL
Anion gap: 8 (ref 5–15)
BUN: 12 mg/dL (ref 6–20)
CO2: 21 mmol/L — ABNORMAL LOW (ref 22–32)
Calcium: 8.7 mg/dL — ABNORMAL LOW (ref 8.9–10.3)
Chloride: 100 mmol/L (ref 98–111)
Creatinine, Ser: 0.86 mg/dL (ref 0.61–1.24)
GFR, Estimated: >60 mL/min (ref >60)
Glucose, Bld: 214 mg/dL — ABNORMAL HIGH (ref 70–99)
Potassium: 3.9 mmol/L (ref 3.5–5.1)
Sodium: 129 mmol/L — ABNORMAL LOW (ref 135–145)

## 2023-04-24 LAB — CBC WITH DIFFERENTIAL/PLATELET
Abs Immature Granulocytes: 0.04 10*3/uL (ref 0.00–0.07)
Basophils Absolute: 0 10*3/uL (ref 0.0–0.1)
Basophils Relative: 0 %
Eosinophils Absolute: 0.1 10*3/uL (ref 0.0–0.5)
Eosinophils Relative: 0 %
HCT: 47.1 % (ref 39.0–52.0)
Hemoglobin: 15.5 g/dL (ref 13.0–17.0)
Immature Granulocytes: 0 %
Lymphocytes Relative: 18 %
Lymphs Abs: 2 10*3/uL (ref 0.7–4.0)
MCH: 29 pg (ref 26.0–34.0)
MCHC: 32.9 g/dL (ref 30.0–36.0)
MCV: 88.2 fL (ref 80.0–100.0)
Monocytes Absolute: 0.6 10*3/uL (ref 0.1–1.0)
Monocytes Relative: 6 %
Neutro Abs: 8.8 10*3/uL — ABNORMAL HIGH (ref 1.7–7.7)
Neutrophils Relative %: 76 %
Platelets: 176 10*3/uL (ref 150–400)
RBC: 5.34 MIL/uL (ref 4.22–5.81)
RDW: 14.1 % (ref 11.5–15.5)
WBC: 11.5 10*3/uL — ABNORMAL HIGH (ref 4.0–10.5)
nRBC: 0 % (ref 0.0–0.2)

## 2023-04-24 LAB — URINALYSIS, ROUTINE W REFLEX MICROSCOPIC
Bilirubin Urine: NEGATIVE
Glucose, UA: NEGATIVE mg/dL
Hgb urine dipstick: NEGATIVE
Ketones, ur: 20 mg/dL — AB
Leukocytes,Ua: NEGATIVE
Nitrite: NEGATIVE
Protein, ur: NEGATIVE mg/dL
Specific Gravity, Urine: 1.008 (ref 1.005–1.030)
pH: 6 (ref 5.0–8.0)

## 2023-04-24 LAB — HEPATIC FUNCTION PANEL
ALT: 17 U/L (ref 0–44)
AST: 18 U/L (ref 15–41)
Albumin: 4.2 g/dL (ref 3.5–5.0)
Alkaline Phosphatase: 77 U/L (ref 38–126)
Bilirubin, Direct: 0.1 mg/dL (ref 0.0–0.2)
Indirect Bilirubin: 0.5 mg/dL (ref 0.3–0.9)
Total Bilirubin: 0.6 mg/dL (ref 0.3–1.2)
Total Protein: 7.3 g/dL (ref 6.5–8.1)

## 2023-04-24 LAB — CBG MONITORING, ED: Glucose-Capillary: 240 mg/dL — ABNORMAL HIGH (ref 70–99)

## 2023-04-24 LAB — LIPASE, BLOOD: Lipase: 31 U/L (ref 11–51)

## 2023-04-24 MED ORDER — METFORMIN HCL 500 MG PO TABS: 500.0000 mg | ORAL_TABLET | Freq: Two times a day (BID) | ORAL | 0 refills | Status: DC

## 2023-04-24 MED ORDER — DIPHENOXYLATE-ATROPINE 2.5-0.025 MG PO TABS
2.0000 | ORAL_TABLET | Freq: Once | ORAL | Status: DC
  Filled 2023-04-24: qty 2

## 2023-04-24 MED ORDER — SODIUM CHLORIDE 0.9 % IV BOLUS
1000.0000 mL | Freq: Once | INTRAVENOUS | Status: AC
  Administered 2023-04-24: 1000 mL via INTRAVENOUS

## 2023-04-24 NOTE — ED Notes (Signed)
Triage CBG is 240

## 2023-04-24 NOTE — ED Provider Notes (Signed)
[place] EMERGENCY DEPARTMENT AT [facility] Provider Note   CSN: [id number] Arrival date & time: 04/24/23  1115     History  Chief Complaint  Patient presents with   Diarrhea    Joel Page is a 52 y.o. male with a history of diet-controlled diabetes, stating he has been self-medicating with supplements including cinnamon and berberine since he lost his insurance and therefore PCP, replacing the metformin he used to be prescribed. However he checks his blood sugars routinely and they have been under good control until he started keto diet 2 weeks ago in attempt to lose weight.  He was actually seen here yesterday morning for some blurry vision, had some mild diarrhea at that time but has progressively worsened today stating he has had no less than 13 "soft serve" consistency stools in the last 24 hours.  He also endorses reduced urinary frequency and his urine this morning was dark orange in coloration.  He is concerned about dehydration and whether he could be in ketosis.  He has ketone urine sticks which he states read high today. He does endorse abdominal gurgling, no real abdominal pain.  He reports generalized fatigue.  He states per his scale his weight went from 238 to 203 pounds since starting this keto diet 2 weeks ago.  He denies fevers or chills, nausea or vomiting.  He does endorse generalized fatigue.  The history is provided by the patient.       Home Medications Prior to Admission medications   Medication Sig Start Date End Date Taking? Authorizing Provider  metFORMIN (GLUCOPHAGE) 500 MG tablet Take 1 tablet (500 mg total) by mouth 2 (two) times daily with a meal. 04/24/23  Yes Pratt Bress, Raynelle Fanning, PA-C  albuterol (VENTOLIN HFA) 108 (90 Base) MCG/ACT inhaler Inhale 2-4 puffs by mouth every 4 hours as needed for wheezing, cough, and/or shortness of breath Patient not taking: Reported on 01/04/2021 09/02/19   Doren Custard, FNP  amoxicillin-clavulanate (AUGMENTIN) 875-125 MG  tablet Take 1 tablet by mouth every 12 (twelve) hours. 06/22/21   Rancour, Jeannett Senior, MD  atorvastatin (LIPITOR) 40 MG tablet Take 1 tablet (40 mg total) by mouth daily at 6 PM. 02/02/20 05/02/20  Debbe Odea, MD  cyclobenzaprine (FLEXERIL) 10 MG tablet Take 1 tablet (10 mg total) by mouth 2 (two) times daily as needed for muscle spasms. 04/19/21   Carroll Sage, PA-C  doxycycline (VIBRAMYCIN) 100 MG capsule Take 1 capsule (100 mg total) by mouth 2 (two) times daily. 12/02/21   Achille Rich, PA-C  Fluticasone-Umeclidin-Vilant (TRELEGY ELLIPTA) 100-62.5-25 MCG/INH AEPB Inhale 1 puff into the lungs daily. Patient not taking: Reported on 01/04/2021 12/18/19   Salena Saner, MD  gabapentin (NEURONTIN) 300 MG capsule Take one capsule by mouth on day 1, then 1 capsule twice daily on day 2, then 1 capsule three times daily thereafter. Patient not taking: Reported on 01/04/2021 05/27/20   Burgess Amor, PA-C  isosorbide mononitrate (IMDUR) 30 MG 24 hr tablet Take 1 tablet (30 mg total) by mouth daily. 03/05/20 06/03/20  Debbe Odea, MD  nitroGLYCERIN (NITROSTAT) 0.4 MG SL tablet Place 1 tablet (0.4 mg total) under the tongue every 5 (five) minutes as needed for chest pain. 12/18/19   Salena Saner, MD  predniSONE (DELTASONE) 50 MG tablet 1 tablet PO daily 06/22/21   Rancour, Jeannett Senior, MD  metoprolol succinate (TOPROL XL) 25 MG 24 hr tablet Take 1 tablet (25 mg total) by mouth daily. Patient not taking: No  sig reported 04/26/20 01/04/21  Debbe Odea, MD      Allergies    Topiramate, Trazodone, Baclofen, and Diclofenac sodium    Review of Systems   Review of Systems  Constitutional:  Positive for fatigue. Negative for fever.  HENT:  Negative for congestion and sore throat.   Eyes: Negative.   Respiratory:  Negative for chest tightness and shortness of breath.   Cardiovascular:  Negative for chest pain.  Gastrointestinal:  Positive for diarrhea. Negative for abdominal pain, nausea and  vomiting.  Genitourinary: Negative.   Musculoskeletal:  Negative for arthralgias, joint swelling and neck pain.  Skin: Negative.  Negative for rash and wound.  Neurological:  Negative for dizziness, weakness, light-headedness, numbness and headaches.  Psychiatric/Behavioral: Negative.      Physical Exam Updated Vital Signs BP (!) 141/83   Pulse 78   Temp 98.6 F (37 C) (Oral)   Resp 18   Ht 5\' 9"  (1.753 m)   Wt 94.3 kg   SpO2 100%   BMI 30.72 kg/m  Physical Exam Vitals and nursing note reviewed.  Constitutional:      Appearance: He is well-developed.  HENT:     Head: Normocephalic and atraumatic.     Mouth/Throat:     Mouth: Mucous membranes are dry.  Eyes:     Conjunctiva/sclera: Conjunctivae normal.  Cardiovascular:     Rate and Rhythm: Normal rate and regular rhythm.     Heart sounds: Normal heart sounds.  Pulmonary:     Effort: Pulmonary effort is normal.     Breath sounds: Normal breath sounds. No wheezing.  Abdominal:     Palpations: Abdomen is soft.     Tenderness: There is no abdominal tenderness.     Comments: Hyperactive bs.  No pain, no distention  Musculoskeletal:        General: Normal range of motion.     Cervical back: Normal range of motion.  Skin:    General: Skin is warm and dry.     Comments: Mild skin tenting.    Neurological:     General: No focal deficit present.     Mental Status: He is alert.     ED Results / Procedures / Treatments   Labs (all labs ordered are listed, but only abnormal results are displayed) Labs Reviewed  CBC WITH DIFFERENTIAL/PLATELET - Abnormal; Notable for the following components:      Result Value   WBC 11.5 (*)    Neutro Abs 8.8 (*)    All other components within normal limits  URINALYSIS, ROUTINE W REFLEX MICROSCOPIC - Abnormal; Notable for the following components:   Ketones, ur 20 (*)    All other components within normal limits  BASIC METABOLIC PANEL - Abnormal; Notable for the following components:    Sodium 129 (*)    CO2 21 (*)    Glucose, Bld 214 (*)    Calcium 8.7 (*)    All other components within normal limits  CBG MONITORING, ED - Abnormal; Notable for the following components:   Glucose-Capillary 240 (*)    All other components within normal limits  LIPASE, BLOOD  HEPATIC FUNCTION PANEL    EKG None  Radiology No results found.  Procedures Procedures    Medications Ordered in ED Medications  sodium chloride 0.9 % bolus 1,000 mL (0 mLs Intravenous Stopped 04/24/23 1303)    ED Course/ Medical Decision Making/ A&P  Medical Decision Making Pt with diarrhea and elevated bs since switching to keto diet.  Concern for dehydration, ketoacidosis, electrolyte imbalance,  bacterial vs viral vs functional diarrhea.  During his stay here we requested a stool sample to test for c dif,  he was unable to provide sample - perhaps this symptom is self limited.  He was given IV fluids after which he felt improved.  Discussed home care of his diet and glucose, he is willing to resume metformin in place of herbal supplements.  Discussed complex carbs in diet in place of zero carbs which was his goal for the past 2 weeks.  Given referrals for obtaining pcp for f/u care.  Amount and/or Complexity of Data Reviewed Labs: ordered.    Details: Labs are reassuring, he does have 20 ketones in his urine, I suspect with the IV fluids given this has probably cleared.  He is not in DKA, his anion gap is 8, glucose on his be met is 214, he does have a low sodium level at 129, he was given normal saline here which would have improved his hyponatremia.  Risk Prescription drug management.           Final Clinical Impression(s) / ED Diagnoses Final diagnoses:  Diarrhea, unspecified type  Hyperglycemia    Rx / DC Orders ED Discharge Orders          Ordered    metFORMIN (GLUCOPHAGE) 500 MG tablet  2 times daily with meals        04/24/23 1546               Burgess Amor, PA-C 04/25/23 1324    Bethann Berkshire, MD 04/26/23 1039

## 2023-04-24 NOTE — Discharge Instructions (Addendum)
I do recommend adding back your metformin for better control of your blood glucose level.  I also recommend cutting back on this extreme keto diet which I think is causing you to have your symptoms.  Adding complex carbohydrates as you are planning is a good plan at this time.  I am referring you to several local clinics where you may be able to receive ongoing medical care, please call for follow-up.  Return here in the meantime if you develop any new or worsening symptoms.  The [facility] specifically may be a good resource for you.  Please refer to their services below.  [facility]  [address] [place], [place] [address] [phone]  Services [facility] offers a variety of basic health services.  Services include but are not limited to: Blood pressure checks  Heart rate checks  Blood sugar checks  Urine analysis  Rapid strep tests  Pregnancy tests.  Health education and referrals  People needing more complex services will be directed to a physician online. Using these virtual visits, doctors can evaluate and prescribe medicine and treatments. There will be no medication on-site, though [facility] will help patients fill their prescriptions at little to no cost.   For More information please go to: [email or web address]

## 2023-04-24 NOTE — ED Triage Notes (Addendum)
Pt states has been doing a keto diet and lost a lot of weight, pt concerned about his ketone levels are at 80 when he checked his urine.  Pt states CBG 260 today. Pt also with c/o diarrhea.

## 2023-04-24 NOTE — ED Notes (Signed)
Pt requested to walk out into parking lot to visit his spouse. Pt was educated on hospital policy. Pt did not leave the building and is back in assigned room.

## 2023-09-06 ENCOUNTER — Ambulatory Visit
Admission: EM | Admit: 2023-09-06 | Discharge: 2023-09-06 | Disposition: A | Discharge status: Home / Self Care | Payer: Self-pay | Attending: Nurse Practitioner | Admitting: Nurse Practitioner

## 2023-09-06 ENCOUNTER — Encounter: Payer: Self-pay | Admitting: Emergency Medicine

## 2023-09-06 DIAGNOSIS — K047 Periapical abscess without sinus: Secondary | ICD-10-CM

## 2023-09-06 DIAGNOSIS — K0889 Other specified disorders of teeth and supporting structures: Secondary | ICD-10-CM

## 2023-09-06 MED ORDER — AMOXICILLIN-POT CLAVULANATE 875-125 MG PO TABS: 1.0000 | ORAL_TABLET | Freq: Two times a day (BID) | ORAL | 0 refills | Status: DC

## 2023-09-06 NOTE — ED Triage Notes (Signed)
Dental pain on left upper side of mouth x 2 weeks.  States having a headache and ear pain on left side.

## 2023-09-06 NOTE — Discharge Instructions (Addendum)
Take medication as prescribed. May take over-the-counter Tylenol Arthritis Strength tablets as needed for pain, fever or general discomfort.  Warm salt water gargles 3-4 times daily until symptoms improve. Apply warm compresses to the affected area to help with pain and discomfort. Go to the emergency department if you experience increased dental pain, facial swelling, if you develop new symptoms of fevers, chills, or other concerns. Follow-up with your dentist as discussed.

## 2023-09-06 NOTE — ED Provider Notes (Signed)
[facility]    CSN: [id number] Arrival date & time: 09/06/23  1847      History   Chief Complaint No chief complaint on file.   HPI Callin Ashe is a 52 y.o. male.   The history is provided by the patient.   Patient presents for complaints of dental pain on the left upper side of his mouth that is been present for the past 2 weeks.  Patient states over the past 24 hours, he has noticed that the tooth is been loose, he states he has noticed drainage oozing from around the tooth.  He also states that he has had headache and left ear pain since that time.  He denies fever, chills, chest pain, abdominal pain, nausea, vomiting, or diarrhea.  Patient reports that he believes that the tooth has been broken in the past.  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   both eyes    Patient Active Problem List   Diagnosis Date Noted   History of leukocytosis 09/30/2019   Multiple closed fractures of ribs of right side 06/30/2019   Esophageal dysphagia 06/30/2019   Cervical stenosis of spinal canal 08/15/2016   Hyperlipidemia LDL goal <100 07/04/2016   Obesity 06/29/2016   Back pain at L4-L5 level 04/07/2016   Cough 03/30/2016   Medication monitoring encounter 03/30/2016   COPD suggested by initial evaluation (HCC) 12/22/2015   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   Diabetes mellitus type 2, uncontrolled (HCC) 05/11/2015   Current smoker 05/11/2015   Hemorrhoids, internal 05/11/2015    Arthralgia of hip 05/11/2015   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 Authorizing Provider  amoxicillin-clavulanate (AUGMENTIN) 875-125 MG tablet Take 1 tablet by mouth every 12 (twelve) hours. 09/06/23  Yes Cashus Halterman-Warren, Sadie Haber, NP  albuterol (VENTOLIN HFA) 108 (90 Base) MCG/ACT inhaler Inhale 2-4 puffs by mouth every 4 hours as needed for wheezing, cough, and/or shortness of breath Patient not taking: Reported on 01/04/2021 09/02/19   Doren Custard, FNP  Fluticasone-Umeclidin-Vilant (TRELEGY ELLIPTA) 100-62.5-25 MCG/INH AEPB Inhale 1 puff into the lungs daily. Patient not taking: Reported on 01/04/2021 12/18/19   Salena Saner, MD  gabapentin (NEURONTIN) 300 MG capsule Take one capsule by mouth on day 1, then 1 capsule twice daily on day 2, then 1 capsule three times daily thereafter. Patient not taking: Reported on 01/04/2021 05/27/20   Burgess Amor, PA-C  nitroGLYCERIN (NITROSTAT) 0.4 MG SL tablet Place 1 tablet (0.4  mg total) under the tongue every 5 (five) minutes as needed for chest pain. 12/18/19   Salena Saner, MD  metoprolol succinate (TOPROL XL) 25 MG 24 hr tablet Take 1 tablet (25 mg total) by mouth daily. Patient not taking: No sig reported 04/26/20 01/04/21  Debbe Odea, MD    Family History Family History  Problem Relation Age of Onset   Lung cancer Mother    Dementia Father    COPD Father    Prostate cancer Father    Diabetes Brother    Cancer Maternal Grandfather     Social History Social History   Tobacco Use   Smoking  status: Some Days    Current packs/day: 1.00    Types: Cigarettes   Smokeless tobacco: Current  Vaping Use   Vaping status: Never Used  Substance Use Topics   Alcohol use: Not Currently    Alcohol/week: 0.0 standard drinks of alcohol   Drug use: Never     Allergies   Topiramate, Trazodone, Baclofen, and Diclofenac sodium   Review of Systems Review of Systems Per HPI  Physical Exam Triage Vital Signs ED Triage Vitals  Encounter Vitals Group     BP 09/06/23 1854 (!) 145/76     Systolic BP Percentile --      Diastolic BP Percentile --      Pulse Rate 09/06/23 1854 83     Resp 09/06/23 1854 18     Temp 09/06/23 1854 98.5 F (36.9 C)     Temp Source 09/06/23 1854 Oral     SpO2 09/06/23 1854 94 %     Weight --      Height --      Head Circumference --      Peak Flow --      Pain Score 09/06/23 1855 6     Pain Loc --      Pain Education --      Exclude from Growth Chart --    No data found.  Updated Vital Signs BP (!) 145/76 (BP Location: Right Arm)   Pulse 83   Temp 98.5 F (36.9 C) (Oral)   Resp 18   SpO2 94%   Visual Acuity Right Eye Distance:   Left Eye Distance:   Bilateral Distance:    Right Eye Near:   Left Eye Near:    Bilateral Near:     Physical Exam Vitals and nursing note reviewed.  Constitutional:      General: He is not in acute distress.    Appearance: Normal appearance.  HENT:     Head: Normocephalic.     Jaw: Tenderness, swelling (left upper jaw) and pain on movement present.     Right Ear: Tympanic membrane, ear canal and external ear normal.     Left Ear: Tympanic membrane, ear canal and external ear normal.     Nose: Nose normal.     Mouth/Throat:     Lips: Pink.     Mouth: Mucous membranes are moist.     Dentition: Abnormal dentition. Dental tenderness and dental abscesses present.   Eyes:     Extraocular Movements: Extraocular movements intact.     Conjunctiva/sclera: Conjunctivae normal.     Pupils: Pupils are equal,  round, and reactive to light.  Cardiovascular:     Rate and Rhythm: Normal rate and regular rhythm.     Pulses: Normal pulses.     Heart sounds: Normal heart sounds.  Pulmonary:     Effort: Pulmonary  effort is normal. No respiratory distress.     Breath sounds: Normal breath sounds. No stridor. No wheezing, rhonchi or rales.  Abdominal:     General: Bowel sounds are normal.     Palpations: Abdomen is soft.     Tenderness: There is no abdominal tenderness.  Musculoskeletal:     Cervical back: Normal range of motion.  Lymphadenopathy:     Cervical: No cervical adenopathy.  Neurological:     General: No focal deficit present.     Mental Status: He is alert and oriented to person, place, and time.  Psychiatric:        Mood and Affect: Mood normal.        Behavior: Behavior normal.      UC Treatments / Results  Labs (all labs ordered are listed, but only abnormal results are displayed) Labs Reviewed - No data to display  EKG   Radiology No results found.  Procedures Procedures (including critical care time)  Medications Ordered in UC Medications - No data to display  Initial Impression / Assessment and Plan / UC Course  I have reviewed the triage vital signs and the nursing notes.  Pertinent labs & imaging results that were available during my care of the patient were reviewed by me and considered in my medical decision making (see chart for details).  Patient presents for complaints of swelling and pain in the gums in the left upper portion of his mouth for the past 24 hours.  On exam, patient has moderate gingival swelling to the gum in the left posterior area of the upper mouth.  Augmentin 875/125 mg prescribed for dental abscess. Supportive care recommendations were provided to the patient to include Tylenol arthritis strength tablets for pain, warm salt water rinses, and warm compresses to the affected area.  Patient is scheduled to see a dentist and he has connected  with.  Patient was given strict ER follow-up precautions.  Patient is in agreement with this plan of care and verbalizes understanding.  All questions were answered.  Patient stable for discharge.    Final Clinical Impressions(s) / UC Diagnoses   Final diagnoses:  Dentalgia  Dental abscess     Discharge Instructions      Take medication as prescribed. May take over-the-counter Tylenol Arthritis Strength tablets as needed for pain, fever or general discomfort.  Warm salt water gargles 3-4 times daily until symptoms improve. Apply warm compresses to the affected area to help with pain and discomfort. Go to the emergency department if you experience increased dental pain, facial swelling, if you develop new symptoms of fevers, chills, or other concerns. Follow-up with your dentist as discussed.      ED Prescriptions     Medication Sig Dispense Auth. Provider   amoxicillin-clavulanate (AUGMENTIN) 875-125 MG tablet Take 1 tablet by mouth every 12 (twelve) hours. 14 tablet [place] Kadlec-Warren, Sadie Haber, NP      PDMP not reviewed this encounter.   Abran Cantor, NP 09/06/23 1918

## 2023-09-10 NOTE — Plan of Care (Signed)
[facility] Tonsillectomy/Adenoidectomy, Postoperative PEDS care plan entered in error.

## 2024-05-20 ENCOUNTER — Encounter: Payer: Self-pay | Admitting: Emergency Medicine

## 2024-05-20 ENCOUNTER — Ambulatory Visit
Admission: EM | Admit: 2024-05-20 | Discharge: 2024-05-20 | Disposition: A | Discharge status: Home / Self Care | Payer: Self-pay | Attending: Nurse Practitioner | Admitting: Nurse Practitioner

## 2024-05-20 DIAGNOSIS — K047 Periapical abscess without sinus: Secondary | ICD-10-CM

## 2024-05-20 DIAGNOSIS — K0889 Other specified disorders of teeth and supporting structures: Secondary | ICD-10-CM

## 2024-05-20 DIAGNOSIS — R220 Localized swelling, mass and lump, head: Secondary | ICD-10-CM

## 2024-05-20 MED ORDER — CHLORHEXIDINE GLUCONATE 0.12 % MT SOLN: 15.0000 mL | Freq: Two times a day (BID) | OROMUCOSAL | 0 refills | Status: AC

## 2024-05-20 MED ORDER — AMOXICILLIN-POT CLAVULANATE 875-125 MG PO TABS: 1.0000 | ORAL_TABLET | Freq: Two times a day (BID) | ORAL | 0 refills | Status: DC

## 2024-05-20 NOTE — ED Triage Notes (Signed)
 Upper front tooth pain x 2 days.  Upper  lip swollen.  Feels pain shooting up to eyes

## 2024-05-20 NOTE — Discharge Instructions (Signed)
 Take medication as prescribed. You may take over-the-counter Tylenol  or ibuprofen  as needed for pain, fever, or general discomfort. Warm salt water gargles 3-4 times daily until symptoms improve.  You may also continue gargling with peroxide while symptoms persist. Continue warm compresses to the affected area to help with pain and discomfort.  Recommend cool compresses to the affected area to help with pain and swelling. Please contact your dentist office to schedule an appointment within the next 7 to 10 days. Go to the emergency department immediately if you experience increased pain, swelling, fever, chills, or other concerns. Follow-up as needed.

## 2024-05-20 NOTE — ED Provider Notes (Signed)
 [facility]    CSN: [id number] Arrival date & time: 05/20/24  1855      History   Chief Complaint No chief complaint on file.   HPI Joel Page is a 53 y.o. male.   The history is provided by the patient.   Patient presents with a 2-day history of pain in his front teeth.  He states since symptoms started, he now has swelling to his top upper lip.  Patient states that he feels like there is a knot under the top lip.  He states that the pain is shooting up towards his eyes.  He states that he does need some dental work done, states that he has tried to get in with his dentist but cannot get an answer.  He denies fever, chills, chest pain, abdominal pain, nausea, vomiting, diarrhea, or rash.  Patient states he has been using tumeric and using warm salt water rinses and peroxide with only temporary relief of his symptoms.  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   both eyes    Patient Active Problem List   Diagnosis Date Noted   History of leukocytosis 09/30/2019   Multiple closed fractures of ribs of right side 06/30/2019   Esophageal dysphagia 06/30/2019   Cervical stenosis of spinal canal 08/15/2016   Hyperlipidemia LDL goal <100 07/04/2016   Obesity 06/29/2016   Back pain at L4-L5 level 04/07/2016   Cough 03/30/2016   Medication monitoring encounter 03/30/2016   COPD suggested by initial evaluation (HCC) 12/22/2015   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   Diabetes  mellitus type 2, uncontrolled (HCC) 05/11/2015   Current smoker 05/11/2015   Hemorrhoids, internal 05/11/2015   Arthralgia of hip 05/11/2015   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 Authorizing Provider  amoxicillin -clavulanate (AUGMENTIN ) 875-125 MG tablet Take 1 tablet by mouth every 12 (twelve) hours. 05/20/24  Yes Leath-Warren, Etta PARAS, NP  chlorhexidine (PERIDEX) 0.12 % solution Use as directed 15 mLs in the mouth or throat 2 (two) times daily for 14 days. 05/20/24 06/03/24 Yes Leath-Warren, Etta PARAS, NP  albuterol  (VENTOLIN  HFA) 108 (90 Base) MCG/ACT inhaler Inhale 2-4 puffs by mouth every 4 hours as needed for wheezing, cough, and/or shortness of breath Patient not taking: Reported on 01/04/2021 09/02/19   Lavina Damien BRAVO, FNP  Fluticasone -Umeclidin-Vilant (TRELEGY ELLIPTA ) 100-62.5-25 MCG/INH AEPB Inhale 1 puff into the lungs daily. Patient not taking: Reported on 01/04/2021 12/18/19   Tamea Dedra CROME, MD  nitroGLYCERIN  (NITROSTAT ) 0.4 MG SL tablet Place 1 tablet (0.4 mg total) under the tongue every 5 (five) minutes as needed for chest pain. 12/18/19   Tamea Dedra CROME, MD  metoprolol  succinate (TOPROL  XL) 25 MG 24 hr tablet Take 1 tablet (25 mg total) by mouth daily. Patient not taking: No sig reported 04/26/20 01/04/21  Darliss Rogue, MD    Family History Family History  Problem Relation Age of Onset   Lung cancer Mother    Dementia Father    COPD Father    Prostate cancer Father    Diabetes Brother    Cancer Maternal Grandfather     Social  History Social History   Tobacco Use   Smoking status: Some Days    Current packs/day: 1.00    Types: Cigarettes   Smokeless tobacco: Current  Vaping Use   Vaping status: Never Used  Substance Use Topics   Alcohol use: Not Currently    Alcohol/week: 0.0 standard drinks of alcohol   Drug use: Never     Allergies   Topiramate , Trazodone , Baclofen , and Diclofenac  sodium   Review of Systems Review of Systems Per HPI  Physical Exam Triage Vital Signs ED Triage Vitals  Encounter Vitals Group     BP 05/20/24 1908 (!) 154/78     Girls Systolic BP Percentile --      Girls Diastolic BP Percentile --      Boys Systolic BP Percentile --      Boys Diastolic BP Percentile --      Pulse Rate 05/20/24 1908 89     Resp 05/20/24 1908 18     Temp 05/20/24 1908 98.4 F (36.9 C)     Temp Source 05/20/24 1908 Oral     SpO2 05/20/24 1908 93 %     Weight --      Height --      Head Circumference --      Peak Flow --      Pain Score 05/20/24 1910 9     Pain Loc --      Pain Education --      Exclude from Growth Chart --    No data found.  Updated Vital Signs BP (!) 154/78 (BP Location: Right Arm)   Pulse 89   Temp 98.4 F (36.9 C) (Oral)   Resp 18   SpO2 93%   Visual Acuity Right Eye Distance:   Left Eye Distance:   Bilateral Distance:    Right Eye Near:   Left Eye Near:    Bilateral Near:     Physical Exam Vitals and nursing note reviewed.  Constitutional:      General: He is not in acute distress.    Appearance: Normal appearance.  HENT:     Head: Normocephalic.     Mouth/Throat:     Lips: Pink.     Dentition: Abnormal dentition. Dental tenderness, gingival swelling, dental caries and dental abscesses present.     Comments: Swelling noted to the top upper lip at the frenulum.  The area is tender to palpation.  Multiple dental caries and dental abnormalities noted to the front teeth.  There is gingival swelling present.  Eyes:     Extraocular Movements:  Extraocular movements intact.     Pupils: Pupils are equal, round, and reactive to light.   Pulmonary:     Effort: Pulmonary effort is normal.   Musculoskeletal:     Cervical back: Normal range of motion.   Skin:  General: Skin is warm and dry.   Neurological:     General: No focal deficit present.     Mental Status: He is alert and oriented to person, place, and time.   Psychiatric:        Mood and Affect: Mood normal.        Behavior: Behavior normal.      UC Treatments / Results  Labs (all labs ordered are listed, but only abnormal results are displayed) Labs Reviewed - No data to display  EKG   Radiology No results found.  Procedures Procedures (including critical care time)  Medications Ordered in UC Medications - No data to display  Initial Impression / Assessment and Plan / UC Course  I have reviewed the triage vital signs and the nursing notes.  Pertinent labs & imaging results that were available during my care of the patient were reviewed by me and considered in my medical decision making (see chart for details).  Will treat for dental abscess with Augmentin  875/125 mg tablets twice daily for the next 7 days along with Peridex 0.12% mouthwash.  Supportive care recommendations were provided and discussed with the patient to include over-the-counter analgesics, ice, and continuing warm salt water gargles or gargling with peroxide.  Patient was advised that if symptoms worsen, he should follow-up in the emergency department immediately for further evaluation.  Recommended follow-up with a dentist within the next 7 to 10 days.  Patient was in agreement with this plan of care and verbalizes understanding.  All questions were answered.  Patient stable for discharge.   Final Clinical Impressions(s) / UC Diagnoses   Final diagnoses:  Dental abscess  Dentalgia  Swelling of upper lip     Discharge Instructions      Take medication as prescribed. You may  take over-the-counter Tylenol  or ibuprofen  as needed for pain, fever, or general discomfort. Warm salt water gargles 3-4 times daily until symptoms improve.  You may also continue gargling with peroxide while symptoms persist. Continue warm compresses to the affected area to help with pain and discomfort.  Recommend cool compresses to the affected area to help with pain and swelling. Please contact your dentist office to schedule an appointment within the next 7 to 10 days. Go to the emergency department immediately if you experience increased pain, swelling, fever, chills, or other concerns. Follow-up as needed.     ED Prescriptions     Medication Sig Dispense Auth. Provider   amoxicillin -clavulanate (AUGMENTIN ) 875-125 MG tablet Take 1 tablet by mouth every 12 (twelve) hours. 14 tablet Leath-Warren, Etta PARAS, NP   chlorhexidine (PERIDEX) 0.12 % solution Use as directed 15 mLs in the mouth or throat 2 (two) times daily for 14 days. 420 mL Leath-Warren, Etta PARAS, NP      PDMP not reviewed this encounter.   Gilmer Etta PARAS, NP 05/20/24 1946

## 2024-10-03 ENCOUNTER — Emergency Department (HOSPITAL_COMMUNITY)
Admission: EM | Admit: 2024-10-03 | Discharge: 2024-10-03 | Disposition: A | Discharge status: Home / Self Care | Payer: Self-pay | Attending: Emergency Medicine | Admitting: Emergency Medicine

## 2024-10-03 ENCOUNTER — Emergency Department (HOSPITAL_COMMUNITY): Payer: Self-pay

## 2024-10-03 ENCOUNTER — Encounter (HOSPITAL_COMMUNITY): Payer: Self-pay | Admitting: Emergency Medicine

## 2024-10-03 ENCOUNTER — Other Ambulatory Visit: Payer: Self-pay

## 2024-10-03 DIAGNOSIS — E119 Type 2 diabetes mellitus without complications: Secondary | ICD-10-CM | POA: Insufficient documentation

## 2024-10-03 DIAGNOSIS — I2510 Atherosclerotic heart disease of native coronary artery without angina pectoris: Secondary | ICD-10-CM | POA: Insufficient documentation

## 2024-10-03 DIAGNOSIS — J069 Acute upper respiratory infection, unspecified: Secondary | ICD-10-CM | POA: Insufficient documentation

## 2024-10-03 DIAGNOSIS — Z79899 Other long term (current) drug therapy: Secondary | ICD-10-CM | POA: Insufficient documentation

## 2024-10-03 DIAGNOSIS — J441 Chronic obstructive pulmonary disease with (acute) exacerbation: Secondary | ICD-10-CM | POA: Insufficient documentation

## 2024-10-03 DIAGNOSIS — J9801 Acute bronchospasm: Secondary | ICD-10-CM | POA: Insufficient documentation

## 2024-10-03 DIAGNOSIS — F17210 Nicotine dependence, cigarettes, uncomplicated: Secondary | ICD-10-CM | POA: Insufficient documentation

## 2024-10-03 LAB — RESP PANEL BY RT-PCR (RSV, FLU A&B, COVID)  RVPGX2
Influenza A by PCR: NEGATIVE
Influenza B by PCR: NEGATIVE
Resp Syncytial Virus by PCR: NEGATIVE
SARS Coronavirus 2 by RT PCR: NEGATIVE

## 2024-10-03 LAB — CBG MONITORING, ED: Glucose-Capillary: 177 mg/dL — ABNORMAL HIGH (ref 70–99)

## 2024-10-03 MED ORDER — IPRATROPIUM-ALBUTEROL 0.5-2.5 (3) MG/3ML IN SOLN
3.0000 mL | Freq: Once | RESPIRATORY_TRACT | Status: AC
  Administered 2024-10-03: 3 mL via RESPIRATORY_TRACT
  Filled 2024-10-03: qty 3

## 2024-10-03 MED ORDER — ALBUTEROL SULFATE HFA 108 (90 BASE) MCG/ACT IN AERS
2.0000 | INHALATION_SPRAY | Freq: Four times a day (QID) | RESPIRATORY_TRACT | Status: DC
  Administered 2024-10-03: 2 via RESPIRATORY_TRACT
  Filled 2024-10-03: qty 6.7

## 2024-10-03 MED ORDER — PREDNISONE 50 MG PO TABS
60.0000 mg | ORAL_TABLET | Freq: Once | ORAL | Status: AC
  Administered 2024-10-03: 60 mg via ORAL
  Filled 2024-10-03: qty 1

## 2024-10-03 MED ORDER — DOXYCYCLINE HYCLATE 100 MG PO CAPS: 100.0000 mg | ORAL_CAPSULE | Freq: Two times a day (BID) | ORAL | 0 refills | Status: DC

## 2024-10-03 MED ORDER — IPRATROPIUM BROMIDE 0.02 % IN SOLN: 0.5000 mg | Freq: Once | RESPIRATORY_TRACT | Status: DC

## 2024-10-03 MED ORDER — PREDNISONE 10 MG PO TABS: 20.0000 mg | ORAL_TABLET | Freq: Every day | ORAL | 0 refills | Status: DC

## 2024-10-03 MED ORDER — ALBUTEROL SULFATE (2.5 MG/3ML) 0.083% IN NEBU
2.5000 mg | INHALATION_SOLUTION | Freq: Once | RESPIRATORY_TRACT | Status: AC
  Administered 2024-10-03: 2.5 mg via RESPIRATORY_TRACT
  Filled 2024-10-03: qty 3

## 2024-10-03 NOTE — ED Provider Notes (Signed)
 [place] [facility] AT [facility] Provider Note   CSN: [id number] Arrival date & time: 10/03/24  9365     Patient presents with: Shortness of Breath   Joel Page is a 53 y.o. male.   The history is provided by the patient.  Shortness of Breath  He has history of diabetes, coronary artery disease, COPD/asthma and comes in because of cough and shortness of breath and fever.  He started getting sick yesterday.  Cough has been productive of sputum but he does not look at the color.  He has had temperature as high as 101 at home.  He denies chills or sweats.  He denies arthralgias or myalgias.  He does endorse a sick contact who is reported to have had both influenza and RSV.  He does continue to smoke 1-1/2 packs of cigarettes a day.  He has not had his annual influenza immunization and has not had a recent COVID-19 immunization.    Prior to Admission medications   Medication Sig Start Date End Date Taking? Authorizing Provider  albuterol  (VENTOLIN  HFA) 108 (90 Base) MCG/ACT inhaler Inhale 2-4 puffs by mouth every 4 hours as needed for wheezing, cough, and/or shortness of breath Patient not taking: Reported on 01/04/2021 09/02/19   Boyce, Emily E, FNP  amoxicillin -clavulanate (AUGMENTIN ) 875-125 MG tablet Take 1 tablet by mouth every 12 (twelve) hours. 05/20/24   Leath-Warren, Etta PARAS, NP  Fluticasone -Umeclidin-Vilant (TRELEGY ELLIPTA ) 100-62.5-25 MCG/INH AEPB Inhale 1 puff into the lungs daily. Patient not taking: Reported on 01/04/2021 12/18/19   Tamea Dedra CROME, MD  nitroGLYCERIN  (NITROSTAT ) 0.4 MG SL tablet Place 1 tablet (0.4 mg total) under the tongue every 5 (five) minutes as needed for chest pain. 12/18/19   Tamea Dedra CROME, MD  metoprolol  succinate (TOPROL  XL) 25 MG 24 hr tablet Take 1 tablet (25 mg total) by mouth daily. Patient not taking: No sig reported 04/26/20 01/04/21  Darliss Rogue, MD    Allergies: Topiramate , Trazodone , Baclofen , and  Diclofenac  sodium    Review of Systems  Respiratory:  Positive for shortness of breath.   All other systems reviewed and are negative.   Updated Vital Signs BP 132/74   Pulse 97   Resp 17   Ht 5' 9 (1.753 m)   Wt 93 kg   SpO2 93%   BMI 30.27 kg/m   Physical Exam Vitals and nursing note reviewed.   53 year old male, appears mildly dyspneic at rest, but is in no acute distress. Vital signs are normal. Oxygen saturation is 93%, which is normal. Head is normocephalic and atraumatic. PERRLA, EOMI.  Lungs have diffuse inspiratory and expiratory wheezes.  There are no rales or rhonchi. Chest is nontender. Heart has regular rate and rhythm without murmur. Abdomen is soft, flat, nontender . Skin is warm and dry without rash. Neurologic: Mental status is normal, cranial nerves are intact, moves all extremities equally.  (all labs ordered are listed, but only abnormal results are displayed) Labs Reviewed  RESP PANEL BY RT-PCR (RSV, FLU A&B, COVID)  [id number]    EKG: EKG Interpretation Date/Time:  Friday October 03 2024 06:45:59 EST Ventricular Rate:  98 PR Interval:  163 QRS Duration:  91 QT Interval:  352 QTC Calculation: 450 R Axis:   -4  Text Interpretation: Sinus rhythm Prominent P waves, nondiagnostic When compared with ECG of 04/22/2023, No significant change was found Confirmed by Raford Lenis ([id number]) on 10/03/2024 6:48:51 AM  Radiology: No results found.   Procedures  Medications Ordered in the ED  ipratropium-albuterol  (DUONEB) 0.5-2.5 (3) MG/3ML nebulizer solution 3 mL (has no administration in time range)  predniSONE  (DELTASONE ) tablet 60 mg (has no administration in time range)                                    Medical Decision Making Amount and/or Complexity of Data Reviewed Radiology: ordered.  Risk Prescription drug management.   Respiratory tract infection with bronchospasm.  This a presentation with a wide range of treatment options and carries  with it a high risk of morbidity and complications.  Differential diagnosis includes, but is not limited to, pneumonia, influenza, COVID-19, RSV, other viral infections, bronchitis.  I have ordered chest x-ray, viral respiratory PCR panel.  I have ordered nebulizer treatment with albuterol  and ipratropium and initial dose of prednisone .  Because of history of diabetes, I have ordered CBG.  I have reviewed his past records and I note ED visit on 12/26/2022 bronchitis.  I have reviewed his electrocardiogram, and my interpretation is normal ECG unchanged from prior.  Case is signed out to Dr. Darcella, oncoming physician.     Final diagnoses:  Viral URI with cough  Bronchospasm    ED Discharge Orders     None          Raford Lenis, MD 10/03/24 [id number]

## 2024-10-03 NOTE — Discharge Instructions (Signed)
 Use that inhaler every 4-6 hours as needed and follow-up with your family doctor next week.  Try to stop smoking

## 2024-10-03 NOTE — ED Triage Notes (Signed)
 PT in by pov c/o SOB x2 days w/ flu like symptoms & fever.

## 2024-12-01 ENCOUNTER — Emergency Department (HOSPITAL_COMMUNITY): Payer: Self-pay

## 2024-12-01 ENCOUNTER — Inpatient Hospital Stay (HOSPITAL_COMMUNITY)
Admission: EM | Admit: 2024-12-01 | Discharge: 2024-12-02 | DRG: 193 | Discharge status: Against Medical Advice | Payer: MEDICAID | Attending: Internal Medicine | Admitting: Internal Medicine

## 2024-12-01 ENCOUNTER — Encounter (HOSPITAL_COMMUNITY): Payer: Self-pay

## 2024-12-01 DIAGNOSIS — Z888 Allergy status to other drugs, medicaments and biological substances status: Secondary | ICD-10-CM

## 2024-12-01 DIAGNOSIS — Z8616 Personal history of COVID-19: Secondary | ICD-10-CM

## 2024-12-01 DIAGNOSIS — F17210 Nicotine dependence, cigarettes, uncomplicated: Secondary | ICD-10-CM | POA: Diagnosis present

## 2024-12-01 DIAGNOSIS — I2510 Atherosclerotic heart disease of native coronary artery without angina pectoris: Secondary | ICD-10-CM | POA: Diagnosis present

## 2024-12-01 DIAGNOSIS — Z833 Family history of diabetes mellitus: Secondary | ICD-10-CM

## 2024-12-01 DIAGNOSIS — Z7952 Long term (current) use of systemic steroids: Secondary | ICD-10-CM

## 2024-12-01 DIAGNOSIS — J440 Chronic obstructive pulmonary disease with acute lower respiratory infection: Secondary | ICD-10-CM | POA: Diagnosis present

## 2024-12-01 DIAGNOSIS — J101 Influenza due to other identified influenza virus with other respiratory manifestations: Secondary | ICD-10-CM

## 2024-12-01 DIAGNOSIS — J1000 Influenza due to other identified influenza virus with unspecified type of pneumonia: Principal | ICD-10-CM | POA: Diagnosis present

## 2024-12-01 DIAGNOSIS — J441 Chronic obstructive pulmonary disease with (acute) exacerbation: Secondary | ICD-10-CM | POA: Diagnosis present

## 2024-12-01 DIAGNOSIS — E1165 Type 2 diabetes mellitus with hyperglycemia: Secondary | ICD-10-CM | POA: Diagnosis present

## 2024-12-01 DIAGNOSIS — Z881 Allergy status to other antibiotic agents status: Secondary | ICD-10-CM

## 2024-12-01 DIAGNOSIS — J189 Pneumonia, unspecified organism: Principal | ICD-10-CM

## 2024-12-01 DIAGNOSIS — J9601 Acute respiratory failure with hypoxia: Secondary | ICD-10-CM | POA: Diagnosis present

## 2024-12-01 DIAGNOSIS — Z7951 Long term (current) use of inhaled steroids: Secondary | ICD-10-CM

## 2024-12-01 LAB — I-STAT CHEM 8, ED
BUN: 8 mg/dL (ref 6–20)
Calcium, Ion: 1.1 mmol/L — ABNORMAL LOW (ref 1.15–1.40)
Chloride: 101 mmol/L (ref 98–111)
Creatinine, Ser: 0.7 mg/dL (ref 0.61–1.24)
Glucose, Bld: 170 mg/dL — ABNORMAL HIGH (ref 70–99)
HCT: 44 % (ref 39.0–52.0)
Hemoglobin: 15 g/dL (ref 13.0–17.0)
Potassium: 3.7 mmol/L (ref 3.5–5.1)
Sodium: 139 mmol/L (ref 135–145)
TCO2: 23 mmol/L (ref 22–32)

## 2024-12-01 LAB — PROTIME-INR
INR: 1.2 (ref 0.8–1.2)
Prothrombin Time: 15.4 s — ABNORMAL HIGH (ref 11.4–15.2)

## 2024-12-01 LAB — CBC WITH DIFFERENTIAL/PLATELET
Abs Immature Granulocytes: 0.04 K/uL (ref 0.00–0.07)
Basophils Absolute: 0 K/uL (ref 0.0–0.1)
Basophils Relative: 0 %
Eosinophils Absolute: 0 K/uL (ref 0.0–0.5)
Eosinophils Relative: 0 %
HCT: 43.2 % (ref 39.0–52.0)
Hemoglobin: 13.9 g/dL (ref 13.0–17.0)
Immature Granulocytes: 0 %
Lymphocytes Relative: 8 %
Lymphs Abs: 1.2 K/uL (ref 0.7–4.0)
MCH: 29.4 pg (ref 26.0–34.0)
MCHC: 32.2 g/dL (ref 30.0–36.0)
MCV: 91.5 fL (ref 80.0–100.0)
Monocytes Absolute: 1.5 K/uL — ABNORMAL HIGH (ref 0.1–1.0)
Monocytes Relative: 10 %
Neutro Abs: 12.6 K/uL — ABNORMAL HIGH (ref 1.7–7.7)
Neutrophils Relative %: 82 %
Platelets: 228 K/uL (ref 150–400)
RBC: 4.72 MIL/uL (ref 4.22–5.81)
RDW: 14.1 % (ref 11.5–15.5)
WBC: 15.4 K/uL — ABNORMAL HIGH (ref 4.0–10.5)
nRBC: 0 % (ref 0.0–0.2)

## 2024-12-01 LAB — COMPREHENSIVE METABOLIC PANEL WITH GFR
ALT: 65 U/L — ABNORMAL HIGH (ref 0–44)
AST: 39 U/L (ref 15–41)
Albumin: 3.8 g/dL (ref 3.5–5.0)
Alkaline Phosphatase: 78 U/L (ref 38–126)
Anion gap: 14 (ref 5–15)
BUN: 8 mg/dL (ref 6–20)
CO2: 22 mmol/L (ref 22–32)
Calcium: 8.7 mg/dL — ABNORMAL LOW (ref 8.9–10.3)
Chloride: 102 mmol/L (ref 98–111)
Creatinine, Ser: 0.71 mg/dL (ref 0.61–1.24)
GFR, Estimated: >60 mL/min
Glucose, Bld: 167 mg/dL — ABNORMAL HIGH (ref 70–99)
Potassium: 3.7 mmol/L (ref 3.5–5.1)
Sodium: 138 mmol/L (ref 135–145)
Total Bilirubin: 0.4 mg/dL (ref 0.0–1.2)
Total Protein: 7.2 g/dL (ref 6.5–8.1)

## 2024-12-01 LAB — RESP PANEL BY RT-PCR (RSV, FLU A&B, COVID)  RVPGX2
Influenza A by PCR: POSITIVE — AB
Influenza B by PCR: NEGATIVE
Resp Syncytial Virus by PCR: NEGATIVE
SARS Coronavirus 2 by RT PCR: NEGATIVE

## 2024-12-01 LAB — APTT: aPTT: 33 s (ref 24–36)

## 2024-12-01 LAB — LACTIC ACID, PLASMA: Lactic Acid, Venous: 0.8 mmol/L (ref 0.5–1.9)

## 2024-12-01 LAB — D-DIMER, QUANTITATIVE: D-Dimer, Quant: 2.22 ug{FEU}/mL — ABNORMAL HIGH (ref 0.00–0.50)

## 2024-12-01 MED ORDER — IOHEXOL 350 MG/ML SOLN
75.0000 mL | Freq: Once | INTRAVENOUS | Status: AC | PRN
  Administered 2024-12-01: 75 mL via INTRAVENOUS

## 2024-12-01 MED ORDER — METHYLPREDNISOLONE SODIUM SUCC 125 MG IJ SOLR
125.0000 mg | Freq: Once | INTRAMUSCULAR | Status: AC
  Administered 2024-12-01: 125 mg via INTRAVENOUS
  Filled 2024-12-01: qty 2

## 2024-12-01 MED ORDER — SODIUM CHLORIDE 0.9 % IV SOLN
2.0000 g | INTRAVENOUS | Status: DC
  Administered 2024-12-01: 2 g via INTRAVENOUS
  Filled 2024-12-01: qty 20

## 2024-12-01 MED ORDER — IPRATROPIUM-ALBUTEROL 0.5-2.5 (3) MG/3ML IN SOLN
3.0000 mL | Freq: Once | RESPIRATORY_TRACT | Status: AC
  Administered 2024-12-01: 3 mL via RESPIRATORY_TRACT

## 2024-12-01 MED ORDER — IPRATROPIUM-ALBUTEROL 0.5-2.5 (3) MG/3ML IN SOLN
3.0000 mL | Freq: Once | RESPIRATORY_TRACT | Status: AC
  Administered 2024-12-01: 3 mL via RESPIRATORY_TRACT
  Filled 2024-12-01: qty 3

## 2024-12-01 MED ORDER — SODIUM CHLORIDE 0.9 % IV SOLN
500.0000 mg | INTRAVENOUS | Status: DC
  Administered 2024-12-01: 500 mg via INTRAVENOUS
  Filled 2024-12-01: qty 5

## 2024-12-01 MED ORDER — LACTATED RINGERS IV BOLUS
1000.0000 mL | Freq: Once | INTRAVENOUS | Status: AC
  Administered 2024-12-01: 1000 mL via INTRAVENOUS

## 2024-12-01 MED ORDER — ACETAMINOPHEN 500 MG PO TABS
1000.0000 mg | ORAL_TABLET | Freq: Once | ORAL | Status: AC
  Administered 2024-12-01: 1000 mg via ORAL
  Filled 2024-12-01: qty 2

## 2024-12-01 NOTE — Sepsis Progress Note (Signed)
 Elink monitoring for the code sepsis protocol.

## 2024-12-01 NOTE — ED Provider Notes (Signed)
 " [place] EMERGENCY DEPARTMENT AT [facility] Provider Note   CSN: [id number] Arrival date & time: 12/01/24  1554     Patient presents with: Cough and Dental Pain   Joel Page is a 54 y.o. male with a history of COPD, CAD, diabetes, and asthma who current everyday smoker who presents with several days of flulike symptoms including fever, chills, myalgias, headache, nasal congestion, and nonproductive cough.  The patient reports associated fatigue and decreased appetite, with intermittent nausea but denies persistent vomiting or diarrhea.  Symptoms began gradually and have progressively worsened despite supportive care at home including antipyretics, bronchodilators, and over-the-counter cold/flu medication.  The patient denies chest pain, hemoptysis, syncope, confusion, neck stiffness, or focal neurological deficits.  Patient does endorse some increasing shortness of breath. Patient states that he also has a loose molar on the upper left side that is loose and he can pop it in and back out The patient is in no acute distress.     Cough Dental Pain      Prior to Admission medications  Medication Sig Start Date End Date Taking? Authorizing Provider  albuterol  (VENTOLIN  HFA) 108 (90 Base) MCG/ACT inhaler Inhale 2-4 puffs by mouth every 4 hours as needed for wheezing, cough, and/or shortness of breath Patient not taking: Reported on 01/04/2021 09/02/19   Boyce, Emily E, FNP  amoxicillin -clavulanate (AUGMENTIN ) 875-125 MG tablet Take 1 tablet by mouth every 12 (twelve) hours. 05/20/24   Leath-Warren, Etta PARAS, NP  doxycycline  (VIBRAMYCIN ) 100 MG capsule Take 1 capsule (100 mg total) by mouth 2 (two) times daily. One po bid x 7 days 10/03/24   Zammit, Joseph, MD  Fluticasone -Umeclidin-Vilant (TRELEGY ELLIPTA ) 100-62.5-25 MCG/INH AEPB Inhale 1 puff into the lungs daily. Patient not taking: Reported on 01/04/2021 12/18/19   Tamea Dedra CROME, MD  nitroGLYCERIN  (NITROSTAT ) 0.4 MG SL  tablet Place 1 tablet (0.4 mg total) under the tongue every 5 (five) minutes as needed for chest pain. 12/18/19   Tamea Dedra CROME, MD  predniSONE  (DELTASONE ) 10 MG tablet Take 2 tablets (20 mg total) by mouth daily. 10/03/24   Zammit, Joseph, MD  metoprolol  succinate (TOPROL  XL) 25 MG 24 hr tablet Take 1 tablet (25 mg total) by mouth daily. Patient not taking: No sig reported 04/26/20 01/04/21  Darliss Rogue, MD    Allergies: Topiramate , Trazodone , Baclofen , and Diclofenac  sodium    Review of Systems  Respiratory:  Positive for cough.     Updated Vital Signs BP 117/61   Pulse 89   Temp 100 F (37.8 C) (Oral)   Resp 11   Ht 5' 9 (1.753 m)   Wt 93 kg   SpO2 95%   BMI 30.27 kg/m   Physical Exam Vitals and nursing note reviewed.  Constitutional:      General: He is not in acute distress.    Appearance: Normal appearance.  HENT:     Head: Normocephalic and atraumatic.     Mouth/Throat:     Dentition: Abnormal dentition. Dental caries present. No dental tenderness or dental abscesses.     Comments: Patient has a loose tooth noted to maxillary # 14. No tenderness to palpation. No abscess visualized.  Eyes:     Extraocular Movements: Extraocular movements intact.     Conjunctiva/sclera: Conjunctivae normal.     Pupils: Pupils are equal, round, and reactive to light.  Cardiovascular:     Rate and Rhythm: Regular rhythm. Tachycardia present.     Pulses: Normal pulses.  Pulmonary:  Effort: Pulmonary effort is normal. No respiratory distress.     Breath sounds: Examination of the right-upper field reveals wheezing. Examination of the left-upper field reveals wheezing. Wheezing present.     Comments: Patient has no difficulty speaking in complete sentences.  Patient has wheezing in upper lobes bilaterally.  Patient appears mildly short of breath on exam.  Abdominal:     General: Abdomen is flat.     Palpations: Abdomen is soft.     Tenderness: There is no abdominal  tenderness.  Musculoskeletal:        General: Normal range of motion.     Cervical back: Normal range of motion.     Right lower leg: No edema.     Left lower leg: No edema.  Lymphadenopathy:     Cervical: Cervical adenopathy present.  Skin:    General: Skin is warm and dry.     Capillary Refill: Capillary refill takes less than 2 seconds.  Neurological:     General: No focal deficit present.     Mental Status: He is alert. Mental status is at baseline.  Psychiatric:        Mood and Affect: Mood normal.     (all labs ordered are listed, but only abnormal results are displayed) Labs Reviewed  RESP PANEL BY RT-PCR (RSV, FLU A&B, COVID)  RVPGX2 - Abnormal; Notable for the following components:      Result Value   Influenza A by PCR POSITIVE (*)    All other components within normal limits  CBC WITH DIFFERENTIAL/PLATELET - Abnormal; Notable for the following components:   WBC 15.4 (*)    Neutro Abs 12.6 (*)    Monocytes Absolute 1.5 (*)    All other components within normal limits  COMPREHENSIVE METABOLIC PANEL WITH GFR - Abnormal; Notable for the following components:   Glucose, Bld 167 (*)    Calcium  8.7 (*)    ALT 65 (*)    All other components within normal limits  D-DIMER, QUANTITATIVE - Abnormal; Notable for the following components:   D-Dimer, Quant 2.22 (*)    All other components within normal limits  PROTIME-INR - Abnormal; Notable for the following components:   Prothrombin Time 15.4 (*)    All other components within normal limits  I-STAT CHEM 8, ED - Abnormal; Notable for the following components:   Glucose, Bld 170 (*)    Calcium , Ion 1.10 (*)    All other components within normal limits  CULTURE, BLOOD (ROUTINE X 2)  CULTURE, BLOOD (ROUTINE X 2)  LACTIC ACID, PLASMA  APTT  LACTIC ACID, PLASMA    EKG: EKG Interpretation Date/Time:  Monday December 01 2024 20:19:19 EST Ventricular Rate:  99 PR Interval:  155 QRS Duration:  93 QT Interval:  351 QTC  Calculation: 451 R Axis:   12  Text Interpretation: Sinus rhythm Confirmed by Cleotilde Rogue ([id number]) on 12/01/2024 8:30:21 PM  Radiology: CT Angio Chest PE W and/or Wo Contrast Result Date: 12/01/2024 EXAM: CTA of the Chest with contrast for PE 12/01/2024 09:48:28 PM TECHNIQUE: CTA of the chest was performed without and with the administration of 75 mL Omnipaque  350 MG/ML intravenous contrast. Multiplanar reformatted images are provided for review. MIP images are provided for review. Automated exposure control, iterative reconstruction, and/or weight based adjustment of the mA/kV was utilized to reduce the radiation dose to as low as reasonably achievable. COMPARISON: Chest x-ray from earlier in the same day. CLINICAL HISTORY: Productive cough. FINDINGS: PULMONARY ARTERIES: The  pulmonary artery shows a normal branching pattern bilaterally. No definitive filling defect to suggest pulmonary embolism is seen. Main pulmonary artery is normal in caliber. MEDIASTINUM: The heart is not significantly enlarged in size. The pericardium demonstrates no acute abnormality. The thoracic aorta shows a normal branching pattern. No aneurysmal dilatation or dissection is seen. The esophagus as visualized is within normal limits. The thoracic inlet is within normal limits. LYMPH NODES: Bulky hilar and mediastinal adenopathy is noted, which is likely reactive in nature given the changes in the lungs bilaterally. No axillary lymphadenopathy. LUNGS AND PLEURA: The lungs are well aerated bilaterally. Patchy nodular changes are seen some of which demonstrates some suggestion of cavitation, particularly in the upper lobes. No sizable effusion is noted. No pneumothorax. UPPER ABDOMEN: The visualized upper abdomen is within normal limits. SOFT TISSUES AND BONES: Bony structures show prior healed rib fracture on the right. No acute soft tissue abnormality. IMPRESSION: 1. No evidence of pulmonary embolism. 2. Patchy bilateral nodular lung  opacities, some with cavitation. This may be related to the underlying influenza diagnosis, although the possibility of atypical infection with cavitation deserves consideration. 3. Bulky hilar and mediastinal adenopathy, likely reactive. Electronically signed by: Oneil Devonshire MD MD 12/01/2024 10:23 PM EST RP Workstation: [id number]   DG Chest 2 View Result Date: 12/01/2024 EXAM: 2 VIEW(S) XRAY OF THE CHEST 12/01/2024 04:24:00 PM COMPARISON: 10/03/2024 CLINICAL HISTORY: cough and COPD FINDINGS: LUNGS AND PLEURA: Background emphysema. Increased diffuse interstitial coarsening since prior radiograph. Findings may represent atypical infection. No pleural effusion. No pneumothorax. HEART AND MEDIASTINUM: No acute abnormality of the cardiac and mediastinal silhouettes. BONES AND SOFT TISSUES: No acute osseous abnormality. IMPRESSION: 1. Diffuse interstitial coarsening, which may represent atypical infection. Electronically signed by: Morgane Naveau MD MD 12/01/2024 05:34 PM EST RP Workstation: [id number]     Procedures   Medications Ordered in the ED  cefTRIAXone  (ROCEPHIN ) 2 g in sodium chloride  0.9 % 100 mL IVPB (0 g Intravenous Stopped 12/01/24 2143)  azithromycin  (ZITHROMAX ) 500 mg in sodium chloride  0.9 % 250 mL IVPB (0 mg Intravenous Stopped 12/01/24 2216)  ipratropium-albuterol  (DUONEB) 0.5-2.5 (3) MG/3ML nebulizer solution 3 mL (3 mLs Nebulization Given 12/01/24 2012)  methylPREDNISolone  sodium succinate (SOLU-MEDROL ) 125 mg/2 mL injection 125 mg (125 mg Intravenous Given 12/01/24 2014)  ipratropium-albuterol  (DUONEB) 0.5-2.5 (3) MG/3ML nebulizer solution 3 mL (3 mLs Nebulization Given 12/01/24 2002)  acetaminophen  (TYLENOL ) tablet 1,000 mg (1,000 mg Oral Given 12/01/24 2021)  lactated ringers  bolus 1,000 mL (0 mLs Intravenous Stopped 12/01/24 2255)  iohexol  (OMNIPAQUE ) 350 MG/ML injection 75 mL (75 mLs Intravenous Contrast Given 12/01/24 2141)                                 Medical Decision  Making Amount and/or Complexity of Data Reviewed Labs: ordered. Decision-making details documented in ED Course. Radiology: ordered. Decision-making details documented in ED Course. ECG/medicine tests:  Decision-making details documented in ED Course.  Risk OTC drugs. Prescription drug management.   Patient presents to the ED for: URI symptoms This involves an extensive number of treatment options Differential diagnosis includes: Infectious etiology Viral vs bacterial Co-morbid conditions: COPD, asthma, CAD, diabetes, and current everyday smoker  Additional history/records obtained and reviewed: Additional history obtained from  outside medical records External records from outside source obtained and reviewed including multiple ED visits in the last year for various infections   Clinical Course as of 12/02/24 0000  Mon  Dec 01, 2024  1938 Temp: 100.2 F (37.9 C) Febrile, tachycardic, patient in no acute distress [ML]  1940 Patient placed on 3LPM O2 [place] for initial oxygen saturation of 88% [ML]  2024 EKG 12-Lead Sinus rhythm  [ML]  2044 CBC with Differential(!) Leukocytosis [ML]  2045 Comprehensive metabolic panel(!) No acute findings [ML]  2131 D-dimer, quantitative(!) Ddimer 2.22 [ML]  2142 Resp panel by RT-PCR (RSV, Flu A&B, Covid) Anterior Nasal Swab(!) Flu A + [ML]  2209 Lactic acid, plasma 0.8 [ML]  2210 DG Chest 2 View Atypical pneumonia [ML]  2349 CT Angio Chest PE W and/or Wo Contrast Patchy bilateral nodular lung opacities, some with cavitation. This may be related to the underlying influenza diagnosis, although the possibility of atypical infection with cavitation deserves consideration. [ML]  2349 Patient given DuoNeb x 2, Solu-Medrol , acetaminophen , IV fluids for symptomatic relief-well-tolerated [ML]    Clinical Course User Index [ML] Willma Duwaine CROME, PA    Data Reviewed / Actions Taken: Labs ordered/reviewed with my independent interpretation in ED  course above. Imaging ordered/reviewed with my independent interpretation in ED course above. I agree with the radiologists interpretation.  EKG ordered/reviewed with my independent interpretation in ED course above. The patient was kept on continuous cardiac monitoring during the ED stay.  Management / Treatments: See ED course above for medications, treatments administered, and clinical rationale.   Reevaluation of the patient after these medicines showed that the patient improved  I have reviewed the patients home medicines and have made adjustments as needed  ED Course / Reassessments: Problem List:  54 year old male presented for URI symptoms. Initial assessment included history, physical exam, and review of prior medical records.  Patient presented hypoxic at 88% RA, tachycardic, febrile, with clinical presentation pointing to growing concern for sepsis.  laboratory testing was obtained and viral PCR testing returned positive for influenza A.  Additional laboratory studies remarkable for leukocytosis and elevated D-dimer. Patient had chest x-ray and CT a that both showed an atypical pneumonia. Patient was placed on oxygen for symptomatic relief.  Patient was treated with bronchodilators, steroids, antipyretic, IV antibiotics and IV fluids. The patient demonstrated very little clinical improvement however remained stable during the ED course. Given patient's initial presentation of growing hypoxia with a new oxygen requirement, pneumonia, and sepsis it was decided that patient is not currently stable for discharge and would be admitted for further evaluation and care. Patient response: minimal improvement  Serial reassessments performed: Yes    Consultations:  Hospitalist GLENWOOD Maier MD Consult recommendations incorporated into plan: Will admit patient for further evaluation and care  Disposition: Disposition: Admission Rationale for disposition: Influenza A, atypical pneumonia, sepsis The  disposition plan and rationale were discussed with the patient at the bedside, all questions were addressed, and the patient demonstrated understanding.  This note was produced using Electronics Engineer. While I have reviewed and verified all clinical information, transcription errors may remain.      Final diagnoses:  None    ED Discharge Orders     None          Willma Duwaine CROME, [place] 12/02/24 0000    Cleotilde Rogue, MD 12/05/24 0730  "

## 2024-12-01 NOTE — ED Triage Notes (Signed)
 Pt c/o productive cough and L upper dental pain since before New Years.  Pain score 5/10.  Pt sts it feels like the tooth is loose in the socket.

## 2024-12-01 NOTE — H&P (Incomplete)
 " History and Physical    Patient: Joel Page FMW:[id number] DOB: 04-03-1971 DOA: 12/01/2024 DOS: the patient was seen and examined on 12/01/2024 PCP: Kerrin Curly BIRCH, MD  Patient coming from: {Point_of_Origin:[address]}  Chief Complaint:  Chief Complaint  Patient presents with   Cough   Dental Pain   HPI: Joel Page is a 54 y.o. 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 He uses smokeless tobacco. He reports that he does not currently use alcohol. He reports that he does not use drugs.  Allergies[1]  Family History  Problem Relation Age of Onset   Lung cancer Mother    Dementia Father    COPD Father    Prostate cancer Father    Diabetes Brother    Cancer Maternal Grandfather     Prior to Admission medications  Medication Sig Start Date End Date Taking? Authorizing Provider  albuterol  (VENTOLIN  HFA) 108 (90 Base) MCG/ACT inhaler Inhale 2-4 puffs by mouth every 4 hours as needed for wheezing, cough, and/or shortness of breath Patient not taking: Reported on 01/04/2021 09/02/19   Boyce, Emily E, FNP  amoxicillin -clavulanate (AUGMENTIN ) 875-125 MG tablet Take 1 tablet by mouth every 12  (twelve) hours. 05/20/24   Leath-Warren, Etta PARAS, NP  doxycycline  (VIBRAMYCIN ) 100 MG capsule Take 1 capsule (100 mg total) by mouth 2 (two) times daily. One po bid x 7 days 10/03/24   Suzette Pac, MD  Fluticasone -Umeclidin-Vilant (TRELEGY ELLIPTA ) 100-62.5-25 MCG/INH AEPB Inhale 1 puff into the lungs daily. Patient not taking: Reported on 01/04/2021 12/18/19   Tamea Dedra CROME, MD  nitroGLYCERIN  (NITROSTAT ) 0.4 MG SL tablet Place 1 tablet (0.4 mg total) under the tongue every 5 (five) minutes as needed for chest pain. 12/18/19   Tamea Dedra CROME, MD  predniSONE  (DELTASONE ) 10 MG tablet Take 2 tablets (20 mg total) by mouth daily. 10/03/24   Suzette Pac, MD  metoprolol  succinate (TOPROL  XL) 25 MG 24 hr tablet Take 1 tablet (25 mg total) by mouth daily. Patient not taking: No sig reported 04/26/20 01/04/21  Darliss Rogue, MD    Physical Exam: Vitals:   12/01/24 2205 12/01/24 2206 12/01/24 2215 12/01/24 2230  BP: 123/60  105/67 117/61  Pulse: 98  96 89  Resp:  19 17 11   Temp:      TempSrc:      SpO2: 95%  94% 95%  Weight:      Height:       *** Data Reviewed: {Tip this will not be part of the note when signed- Document your independent interpretation of telemetry tracing, EKG, lab, Radiology test or any other  diagnostic tests. Add any new diagnostic test ordered today. (Optional):26781} {Results:26384}  Assessment and Plan: No notes have been filed under this hospital service. Service: Hospitalist     Advance Care Planning:   Code Status: Not on file ***  Consults: ***  Family Communication: ***  Severity of Illness: {Observation/Inpatient:21159}  Author: Posey Maier, DO 12/01/2024 11:33 PM  For on call review [email or web address].     [1]  Allergies Allergen Reactions   Topiramate  Other (See Comments)    White light in peripheral vision   Trazodone  Other (See Comments)    Makes tingling & numbness in hands & arms worse; causes an erection.   Baclofen   Itching   Diclofenac  Sodium Other (See Comments)    Raised blood sugar    "

## 2024-12-02 ENCOUNTER — Other Ambulatory Visit: Payer: Self-pay

## 2024-12-02 LAB — COMPREHENSIVE METABOLIC PANEL WITH GFR
ALT: 71 U/L — ABNORMAL HIGH (ref 0–44)
AST: 41 U/L (ref 15–41)
Albumin: 3.5 g/dL (ref 3.5–5.0)
Alkaline Phosphatase: 100 U/L (ref 38–126)
Anion gap: 9 (ref 5–15)
BUN: 12 mg/dL (ref 6–20)
CO2: 28 mmol/L (ref 22–32)
Calcium: 8.6 mg/dL — ABNORMAL LOW (ref 8.9–10.3)
Chloride: 101 mmol/L (ref 98–111)
Creatinine, Ser: 0.8 mg/dL (ref 0.61–1.24)
GFR, Estimated: >60 mL/min
Glucose, Bld: 425 mg/dL — ABNORMAL HIGH (ref 70–99)
Potassium: 4 mmol/L (ref 3.5–5.1)
Sodium: 138 mmol/L (ref 135–145)
Total Bilirubin: 0.4 mg/dL (ref 0.0–1.2)
Total Protein: 6.9 g/dL (ref 6.5–8.1)

## 2024-12-02 LAB — HEMOGLOBIN A1C
Hgb A1c MFr Bld: 7 % — ABNORMAL HIGH (ref 4.8–5.6)
Mean Plasma Glucose: 154.2 mg/dL

## 2024-12-02 LAB — CBC
HCT: 40.4 % (ref 39.0–52.0)
Hemoglobin: 13.2 g/dL (ref 13.0–17.0)
MCH: 29.9 pg (ref 26.0–34.0)
MCHC: 32.7 g/dL (ref 30.0–36.0)
MCV: 91.4 fL (ref 80.0–100.0)
Platelets: 224 K/uL (ref 150–400)
RBC: 4.42 MIL/uL (ref 4.22–5.81)
RDW: 13.9 % (ref 11.5–15.5)
WBC: 14.7 K/uL — ABNORMAL HIGH (ref 4.0–10.5)
nRBC: 0 % (ref 0.0–0.2)

## 2024-12-02 LAB — LACTIC ACID, PLASMA: Lactic Acid, Venous: 1.2 mmol/L (ref 0.5–1.9)

## 2024-12-02 LAB — MAGNESIUM: Magnesium: 2.3 mg/dL (ref 1.7–2.4)

## 2024-12-02 LAB — PHOSPHORUS: Phosphorus: 2.9 mg/dL (ref 2.5–4.6)

## 2024-12-02 LAB — GLUCOSE, CAPILLARY
Glucose-Capillary: 353 mg/dL — ABNORMAL HIGH (ref 70–99)
Glucose-Capillary: 307 mg/dL — ABNORMAL HIGH (ref 70–99)
Glucose-Capillary: 311 mg/dL — ABNORMAL HIGH (ref 70–99)

## 2024-12-02 LAB — STREP PNEUMONIAE URINARY ANTIGEN: Strep Pneumo Urinary Antigen: NEGATIVE

## 2024-12-02 LAB — HIV ANTIBODY (ROUTINE TESTING W REFLEX): HIV Screen 4th Generation wRfx: NONREACTIVE

## 2024-12-02 MED ORDER — IPRATROPIUM-ALBUTEROL 0.5-2.5 (3) MG/3ML IN SOLN: 3.0000 mL | RESPIRATORY_TRACT | Status: DC | PRN

## 2024-12-02 MED ORDER — ONDANSETRON HCL 4 MG PO TABS: 4.0000 mg | ORAL_TABLET | Freq: Four times a day (QID) | ORAL | Status: DC | PRN

## 2024-12-02 MED ORDER — GUAIFENESIN-DM 100-10 MG/5ML PO SYRP
5.0000 mL | ORAL_SOLUTION | ORAL | Status: DC | PRN
  Administered 2024-12-02: 5 mL via ORAL
  Filled 2024-12-02: qty 5

## 2024-12-02 MED ORDER — ONDANSETRON HCL 4 MG/2ML IJ SOLN: 4.0000 mg | Freq: Four times a day (QID) | INTRAMUSCULAR | Status: DC | PRN

## 2024-12-02 MED ORDER — AZITHROMYCIN 250 MG PO TABS: 250.0000 mg | ORAL_TABLET | Freq: Every day | ORAL | 0 refills | Status: AC

## 2024-12-02 MED ORDER — ACETAMINOPHEN 650 MG RE SUPP: 650.0000 mg | Freq: Four times a day (QID) | RECTAL | Status: DC | PRN

## 2024-12-02 MED ORDER — ENOXAPARIN SODIUM 40 MG/0.4ML IJ SOSY
40.0000 mg | PREFILLED_SYRINGE | INTRAMUSCULAR | Status: DC
  Administered 2024-12-02: 40 mg via SUBCUTANEOUS
  Filled 2024-12-02: qty 0.4

## 2024-12-02 MED ORDER — BUDESONIDE 0.5 MG/2ML IN SUSP
0.5000 mg | Freq: Two times a day (BID) | RESPIRATORY_TRACT | Status: DC
  Administered 2024-12-02: 0.5 mg via RESPIRATORY_TRACT
  Filled 2024-12-02: qty 2

## 2024-12-02 MED ORDER — IPRATROPIUM-ALBUTEROL 0.5-2.5 (3) MG/3ML IN SOLN
3.0000 mL | Freq: Four times a day (QID) | RESPIRATORY_TRACT | Status: DC
  Administered 2024-12-02 (×3): 3 mL via RESPIRATORY_TRACT
  Filled 2024-12-02 (×3): qty 3

## 2024-12-02 MED ORDER — ALBUTEROL SULFATE HFA 108 (90 BASE) MCG/ACT IN AERS: 1.0000 | INHALATION_SPRAY | Freq: Four times a day (QID) | RESPIRATORY_TRACT | 1 refills | Status: AC | PRN

## 2024-12-02 MED ORDER — DM-GUAIFENESIN ER 30-600 MG PO TB12
1.0000 | ORAL_TABLET | Freq: Two times a day (BID) | ORAL | Status: DC
  Administered 2024-12-02 (×2): 1 via ORAL
  Filled 2024-12-02 (×2): qty 1

## 2024-12-02 MED ORDER — PANTOPRAZOLE SODIUM 40 MG PO TBEC
40.0000 mg | DELAYED_RELEASE_TABLET | Freq: Every day | ORAL | Status: DC
  Administered 2024-12-02: 40 mg via ORAL
  Filled 2024-12-02: qty 1

## 2024-12-02 MED ORDER — ACETAMINOPHEN 325 MG PO TABS
650.0000 mg | ORAL_TABLET | Freq: Four times a day (QID) | ORAL | Status: DC | PRN
  Filled 2024-12-02: qty 2

## 2024-12-02 MED ORDER — MENTHOL 3 MG MT LOZG
1.0000 | LOZENGE | OROMUCOSAL | Status: DC | PRN
  Filled 2024-12-02: qty 9

## 2024-12-02 MED ORDER — INSULIN ASPART 100 UNIT/ML IJ SOLN
0.0000 [IU] | Freq: Three times a day (TID) | INTRAMUSCULAR | Status: DC
  Administered 2024-12-02: 15 [IU] via SUBCUTANEOUS
  Administered 2024-12-02: 11 [IU] via SUBCUTANEOUS
  Filled 2024-12-02 (×2): qty 1

## 2024-12-02 MED ORDER — NICOTINE 21 MG/24HR TD PT24
21.0000 mg | MEDICATED_PATCH | Freq: Every day | TRANSDERMAL | Status: DC
  Administered 2024-12-02: 21 mg via TRANSDERMAL
  Filled 2024-12-02: qty 1

## 2024-12-02 MED ORDER — CALCIUM GLUCONATE-NACL 2-0.675 GM/100ML-% IV SOLN: 2.0000 g | Freq: Once | INTRAVENOUS | Status: DC

## 2024-12-02 MED ORDER — OSELTAMIVIR PHOSPHATE 75 MG PO CAPS
75.0000 mg | ORAL_CAPSULE | Freq: Two times a day (BID) | ORAL | Status: DC
  Administered 2024-12-02 (×2): 75 mg via ORAL
  Filled 2024-12-02 (×2): qty 1

## 2024-12-02 MED ORDER — CALCIUM GLUCONATE-NACL 1-0.675 GM/50ML-% IV SOLN
1.0000 g | INTRAVENOUS | Status: AC
  Administered 2024-12-02 (×2): 1000 mg via INTRAVENOUS
  Filled 2024-12-02 (×2): qty 50

## 2024-12-02 MED ORDER — METHYLPREDNISOLONE SODIUM SUCC 40 MG IJ SOLR
40.0000 mg | Freq: Two times a day (BID) | INTRAMUSCULAR | Status: DC
  Administered 2024-12-02: 40 mg via INTRAVENOUS
  Filled 2024-12-02: qty 1

## 2024-12-02 MED ORDER — GUAIFENESIN ER 600 MG PO TB12
1200.0000 mg | ORAL_TABLET | Freq: Two times a day (BID) | ORAL | Status: DC
  Administered 2024-12-02: 1200 mg via ORAL
  Filled 2024-12-02: qty 2

## 2024-12-02 MED ORDER — INSULIN ASPART 100 UNIT/ML IJ SOLN: 0.0000 [IU] | Freq: Every day | INTRAMUSCULAR | Status: DC

## 2024-12-02 MED ORDER — AZITHROMYCIN 250 MG PO TABS
500.0000 mg | ORAL_TABLET | Freq: Every day | ORAL | Status: DC
  Administered 2024-12-02: 500 mg via ORAL
  Filled 2024-12-02: qty 2

## 2024-12-02 MED ORDER — PREDNISONE 20 MG PO TABS: 40.0000 mg | ORAL_TABLET | Freq: Every day | ORAL | 0 refills | Status: AC

## 2024-12-02 NOTE — Progress Notes (Signed)
 Oxygen trial initiated.

## 2024-12-02 NOTE — Progress Notes (Signed)
" °  Transition of Care [facility]) Screening Note   Patient Details  Name: Jessey Stehlin Date of Birth: [birth year]   Transition of Care [facility]) CM/SW Contact:    Hoy DELENA Bigness, LCSW Phone Number: 12/02/2024, 8:50 AM    Transition of Care Department [facility]) has reviewed patient and no TOC needs have been identified at this time. We will continue to monitor patient advancement through interdisciplinary progression rounds. If new patient transition needs arise, please place a TOC consult.    12/02/24 0849  TOC Brief Assessment  Insurance and Status Lapsed  Patient has primary care physician Yes  Home environment has been reviewed From home  Prior level of function: Independent  Prior/Current Home Services No current home services  Social Drivers of Health Review SDOH reviewed no interventions necessary  Readmission risk has been reviewed Yes  Transition of care needs no transition of care needs at this time    "

## 2024-12-02 NOTE — Hospital Course (Addendum)
 Brief Narrative:  54 year old with history of COPD, tobacco use comes to the ED with flulike symptoms.  Upon admission noted to have elevated D-dimer, leukocytosis.  CTA chest negative for PE but showed possible concerns of atypical infection with cavitation.   Assessment & Plan:    Community-acquired pneumonia/atypical infection Pulmonary nodular opacification with possible cavitation Influenza A infection -Currently on Rocephin  and azithromycin .  CTA changes could be related to ongoing infection.  No PE noted.  Will need repeat CT chest in 2-3 months outpatient to reassess -Tamiflu  for 5 days   Acute exacerbation of COPD Acute respiratory failure with hypoxia -Continue steroids, scheduled and as needed bronchodilators.  I-S/flutter valve   Elevated D-dimer CT angiography of chest showed no evidence of pulmonary emboli   Tobacco abuse Patient was counseled on tobacco abuse cessation Nicotine  patch provided  Hyperglycemia Diabetes mellitus type 2 - Not on any home medication.  Prior A1c from 2020 was 7.9.  Will repeat this.  Place him on sliding scale and Accu-Cheks. Consulted DM coordinator.    DVT prophylaxis: Lovenox     Code Status: Full Code Family Communication:   Status is: Inpatient Remains inpatient appropriate because: On going management for CAP, Flu and COPD Hopefully home in next 24-48 hours  PT Follow up Recs:   Subjective:  Still having some cough and congestion  Examination:  General exam: Appears calm and comfortable  Respiratory system: Diffuse rhonchi Cardiovascular system: S1 & S2 heard, RRR. No JVD, murmurs, rubs, gallops or clicks. No pedal edema. Gastrointestinal system: Abdomen is nondistended, soft and nontender. No organomegaly or masses felt. Normal bowel sounds heard. Central nervous system: Alert and oriented. No focal neurological deficits. Extremities: Symmetric 5 x 5 power. Skin: No rashes, lesions or ulcers Psychiatry: Judgement and  insight appear normal. Mood & affect appropriate.

## 2024-12-02 NOTE — Progress Notes (Signed)
 Patients wife is bedside and patient amendment about wanting to leave, MD made aware, AMA papers signed by patient.

## 2024-12-02 NOTE — Progress Notes (Signed)
 Patient is insisting on leaving hospital.MD made aware of patient wanting to leave AMA despite conversations had in regards to glucose levels, and breathing sounds. Patients wife left bedside to go get dinner and said she would be back to pick up patient.

## 2024-12-02 NOTE — Plan of Care (Signed)
  Problem: Fluid Volume: Goal: Hemodynamic stability will improve Outcome: Progressing   Problem: Respiratory: Goal: Ability to maintain adequate ventilation will improve Outcome: Progressing   

## 2024-12-02 NOTE — Plan of Care (Signed)
°  Problem: Respiratory: Goal: Ability to maintain adequate ventilation will improve Outcome: Progressing   Problem: Clinical Measurements: Goal: Diagnostic test results will improve Outcome: Progressing Goal: Signs and symptoms of infection will decrease Outcome: Progressing   Problem: Clinical Measurements: Goal: Ability to maintain clinical measurements within normal limits will improve Outcome: Progressing Goal: Will remain free from infection Outcome: Progressing Goal: Diagnostic test results will improve Outcome: Progressing Goal: Respiratory complications will improve Outcome: Progressing Goal: Cardiovascular complication will be avoided Outcome: Progressing

## 2024-12-02 NOTE — Progress Notes (Signed)
 " PROGRESS NOTE    Joel Page  FMW:[id number] DOB: 09-Apr-1971 DOA: 12/01/2024 PCP: Kerrin Curly BIRCH, MD    Brief Narrative:  54 year old with history of COPD, tobacco use comes to the ED with flulike symptoms.  Upon admission noted to have elevated D-dimer, leukocytosis.  CTA chest negative for PE but showed possible concerns of atypical infection with cavitation.   Assessment & Plan:    Community-acquired pneumonia/atypical infection Pulmonary nodular opacification with possible cavitation Influenza A infection -Currently on Rocephin  and azithromycin .  CTA changes could be related to ongoing infection.  No PE noted.  Will need repeat CT chest in 2-3 months outpatient to reassess -Tamiflu  for 5 days   Acute exacerbation of COPD Acute respiratory failure with hypoxia -Continue steroids, scheduled and as needed bronchodilators.  I-S/flutter valve   Elevated D-dimer CT angiography of chest showed no evidence of pulmonary emboli   Tobacco abuse Patient was counseled on tobacco abuse cessation Nicotine  patch provided  Hyperglycemia Diabetes mellitus type 2 - Not on any home medication.  Prior A1c from 2020 was 7.9.  Will repeat this.  Place him on sliding scale and Accu-Cheks. Consulted DM coordinator.    DVT prophylaxis: Lovenox     Code Status: Full Code Family Communication:   Status is: Inpatient Remains inpatient appropriate because: On going management for CAP, Flu and COPD Hopefully home in next 24-48 hours  PT Follow up Recs:   Subjective:  Still having some cough and congestion  Examination:  General exam: Appears calm and comfortable  Respiratory system: Diffuse rhonchi Cardiovascular system: S1 & S2 heard, RRR. No JVD, murmurs, rubs, gallops or clicks. No pedal edema. Gastrointestinal system: Abdomen is nondistended, soft and nontender. No organomegaly or masses felt. Normal bowel sounds heard. Central nervous system: Alert and oriented. No focal  neurological deficits. Extremities: Symmetric 5 x 5 power. Skin: No rashes, lesions or ulcers Psychiatry: Judgement and insight appear normal. Mood & affect appropriate.                Diet Orders (From admission, onward)     Start     Ordered   12/02/24 0112  Diet heart healthy/carb modified Room service appropriate? Yes; Fluid consistency: Thin  Diet effective now       Question Answer Comment  Diet-HS Snack? Nothing   Room service appropriate? Yes   Fluid consistency: Thin      12/02/24 0112            Objective: Vitals:   12/02/24 0155 12/02/24 0330 12/02/24 0354 12/02/24 0743  BP:   138/76   Pulse:   90   Resp:   18   Temp:   98.9 F (37.2 C)   TempSrc:   Oral   SpO2: 92% (!) 86% 93% 90%  Weight:      Height:        Intake/Output Summary (Last 24 hours) at 12/02/2024 1136 Last data filed at 12/02/2024 0908 Gross per 24 hour  Intake 1590 ml  Output --  Net 1590 ml   Filed Weights   12/01/24 1608 12/02/24 0043  Weight: 93 kg 90.6 kg    Scheduled Meds:  budesonide  (PULMICORT ) nebulizer solution  0.5 mg Nebulization BID   enoxaparin  (LOVENOX ) injection  40 mg Subcutaneous Q24H   guaiFENesin   1,200 mg Oral BID   insulin  aspart  0-15 Units Subcutaneous TID WC   insulin  aspart  0-5 Units Subcutaneous QHS   ipratropium-albuterol   3 mL Nebulization Q6H  methylPREDNISolone  (SOLU-MEDROL ) injection  40 mg Intravenous Q12H   nicotine   21 mg Transdermal Daily   oseltamivir   75 mg Oral BID   pantoprazole   40 mg Oral Daily   Continuous Infusions:  azithromycin  Stopped (12/01/24 2216)   calcium  gluconate 1,000 mg (12/02/24 1120)   cefTRIAXone  (ROCEPHIN )  IV Stopped (12/01/24 2143)    Nutritional status     Body mass index is 29.5 kg/m.  Data Reviewed:   CBC: Recent Labs  Lab 12/01/24 2007 12/01/24 2014 12/02/24 0432  WBC 15.4*  --  14.7*  NEUTROABS 12.6*  --   --   HGB 13.9 15.0 13.2  HCT 43.2 44.0 40.4  MCV 91.5  --  91.4  PLT 228   --  224   Basic Metabolic Panel: Recent Labs  Lab 12/01/24 2007 12/01/24 2014 12/01/24 2359 12/02/24 0432  NA 138 139  --  138  K 3.7 3.7  --  4.0  CL 102 101  --  101  CO2 22  --   --  28  GLUCOSE 167* 170*  --  425*  BUN 8 8  --  12  CREATININE 0.71 0.70  --  0.80  CALCIUM  8.7*  --   --  8.6*  MG  --   --  2.3  --   PHOS  --   --  2.9  --    GFR: Estimated Creatinine Clearance (by C-G formula based on SCr of 0.8 mg/dL) Male: 04.6 mL/min Male: 116.1 mL/min Liver Function Tests: Recent Labs  Lab 12/01/24 2007 12/02/24 0432  AST 39 41  ALT 65* 71*  ALKPHOS 78 100  BILITOT 0.4 0.4  PROT 7.2 6.9  ALBUMIN 3.8 3.5   No results for input(s): LIPASE, AMYLASE in the last 168 hours. No results for input(s): AMMONIA in the last 168 hours. Coagulation Profile: Recent Labs  Lab 12/01/24 2007  INR 1.2   Cardiac Enzymes: No results for input(s): CKTOTAL, CKMB, CKMBINDEX, TROPONINI in the last 168 hours. BNP (last 3 results) No results for input(s): PROBNP in the last 8760 hours. HbA1C: Recent Labs    12/02/24 0432  HGBA1C 7.0*   CBG: No results for input(s): GLUCAP in the last 168 hours. Lipid Profile: No results for input(s): CHOL, HDL, LDLCALC, TRIG, CHOLHDL, LDLDIRECT in the last 72 hours. Thyroid Function Tests: No results for input(s): TSH, T4TOTAL, FREET4, T3FREE, THYROIDAB in the last 72 hours. Anemia Panel: No results for input(s): VITAMINB12, FOLATE, FERRITIN, TIBC, IRON, RETICCTPCT in the last 72 hours. Sepsis Labs: Recent Labs  Lab 12/01/24 2109 12/01/24 2359  LATICACIDVEN 0.8 1.2    Recent Results (from the past 240 hours)  Resp panel by RT-PCR (RSV, Flu A&B, Covid) Anterior Nasal Swab     Status: Abnormal   Collection Time: 12/01/24  8:25 PM   Specimen: Anterior Nasal Swab  Result Value Ref Range Status   SARS Coronavirus 2 by RT PCR NEGATIVE NEGATIVE Final    Comment: (NOTE) SARS-CoV-2  target nucleic acids are NOT DETECTED.  The SARS-CoV-2 RNA is generally detectable in upper respiratory specimens during the acute phase of infection. The lowest concentration of SARS-CoV-2 viral copies this assay can detect is 138 copies/mL. A negative result does not preclude SARS-Cov-2 infection and should not be used as the sole basis for treatment or other patient management decisions. A negative result may occur with  improper specimen collection/handling, submission of specimen other than nasopharyngeal swab, presence of viral mutation(s) within the areas  targeted by this assay, and inadequate number of viral copies(<138 copies/mL). A negative result must be combined with clinical observations, patient history, and epidemiological information. The expected result is Negative.  Fact Sheet for Patients:  bloggercourse.com  Fact Sheet for Healthcare Providers:  seriousbroker.it  This test is no t yet approved or cleared by the [place]  FDA and  has been authorized for detection and/or diagnosis of SARS-CoV-2 by FDA under an Emergency Use Authorization (EUA). This EUA will remain  in effect (meaning this test can be used) for the duration of the COVID-19 declaration under Section 564(b)(1) of the Act, 21 U.S.C.section 360bbb-3(b)(1), unless the authorization is terminated  or revoked sooner.       Influenza A by PCR POSITIVE (A) NEGATIVE Final   Influenza B by PCR NEGATIVE NEGATIVE Final    Comment: (NOTE) The Xpert Xpress SARS-CoV-2/FLU/RSV plus assay is intended as an aid in the diagnosis of influenza from Nasopharyngeal swab specimens and should not be used as a sole basis for treatment. Nasal washings and aspirates are unacceptable for Xpert Xpress SARS-CoV-2/FLU/RSV testing.  Fact Sheet for Patients: bloggercourse.com  Fact Sheet for Healthcare  Providers: seriousbroker.it  This test is not yet approved or cleared by the [place]  FDA and has been authorized for detection and/or diagnosis of SARS-CoV-2 by FDA under an Emergency Use Authorization (EUA). This EUA will remain in effect (meaning this test can be used) for the duration of the COVID-19 declaration under Section 564(b)(1) of the Act, 21 U.S.C. section 360bbb-3(b)(1), unless the authorization is terminated or revoked.     Resp Syncytial Virus by PCR NEGATIVE NEGATIVE Final    Comment: (NOTE) Fact Sheet for Patients: bloggercourse.com  Fact Sheet for Healthcare Providers: seriousbroker.it  This test is not yet approved or cleared by the [place]  FDA and has been authorized for detection and/or diagnosis of SARS-CoV-2 by FDA under an Emergency Use Authorization (EUA). This EUA will remain in effect (meaning this test can be used) for the duration of the COVID-19 declaration under Section 564(b)(1) of the Act, 21 U.S.C. section 360bbb-3(b)(1), unless the authorization is terminated or revoked.  Performed at [facility], [address], [place], [place] [address]   Culture, blood (x 2)     Status: None (Preliminary result)   Collection Time: 12/01/24  9:09 PM   Specimen: BLOOD LEFT FOREARM  Result Value Ref Range Status   Specimen Description BLOOD LEFT FOREARM  Final   Special Requests   Final    BOTTLES DRAWN AEROBIC AND ANAEROBIC Blood Culture adequate volume   Culture   Final    NO GROWTH < 12 HOURS Performed at [facility], [address] [place], [place] [address]    Report Status PENDING  Incomplete  Culture, blood (x 2)     Status: None (Preliminary result)   Collection Time: 12/01/24  9:10 PM   Specimen: BLOOD  Result Value Ref Range Status   Specimen Description BLOOD LEFT ANTECUBITAL  Final   Special Requests   Final    BOTTLES DRAWN AEROBIC AND ANAEROBIC Blood  Culture adequate volume   Culture   Final    NO GROWTH < 12 HOURS Performed at [facility], [address], [place], [place] [address]    Report Status PENDING  Incomplete  Expectorated Sputum Assessment w Gram Stain, Rflx to Resp Cult     Status: None (Preliminary result)   Collection Time: 12/02/24  3:40 AM   Specimen: Expectorated Sputum  Result  Value Ref Range Status   Specimen Description EXPECTORATED SPUTUM  Final   Special Requests NONE  Final   Sputum evaluation   Final    THIS SPECIMEN IS ACCEPTABLE FOR SPUTUM CULTURE Performed at [facility], [address] [place], [place] [address]    Report Status PENDING  Incomplete         Radiology Studies: CT Angio Chest PE W and/or Wo Contrast Result Date: 12/01/2024 EXAM: CTA of the Chest with contrast for PE 12/01/2024 09:48:28 PM TECHNIQUE: CTA of the chest was performed without and with the administration of 75 mL Omnipaque  350 MG/ML intravenous contrast. Multiplanar reformatted images are provided for review. MIP images are provided for review. Automated exposure control, iterative reconstruction, and/or weight based adjustment of the mA/kV was utilized to reduce the radiation dose to as low as reasonably achievable. COMPARISON: Chest x-ray from earlier in the same day. CLINICAL HISTORY: Productive cough. FINDINGS: PULMONARY ARTERIES: The pulmonary artery shows a normal branching pattern bilaterally. No definitive filling defect to suggest pulmonary embolism is seen. Main pulmonary artery is normal in caliber. MEDIASTINUM: The heart is not significantly enlarged in size. The pericardium demonstrates no acute abnormality. The thoracic aorta shows a normal branching pattern. No aneurysmal dilatation or dissection is seen. The esophagus as visualized is within normal limits. The thoracic inlet is within normal limits. LYMPH NODES: Bulky hilar and mediastinal adenopathy is noted, which is likely reactive in nature given the changes in the  lungs bilaterally. No axillary lymphadenopathy. LUNGS AND PLEURA: The lungs are well aerated bilaterally. Patchy nodular changes are seen some of which demonstrates some suggestion of cavitation, particularly in the upper lobes. No sizable effusion is noted. No pneumothorax. UPPER ABDOMEN: The visualized upper abdomen is within normal limits. SOFT TISSUES AND BONES: Bony structures show prior healed rib fracture on the right. No acute soft tissue abnormality. IMPRESSION: 1. No evidence of pulmonary embolism. 2. Patchy bilateral nodular lung opacities, some with cavitation. This may be related to the underlying influenza diagnosis, although the possibility of atypical infection with cavitation deserves consideration. 3. Bulky hilar and mediastinal adenopathy, likely reactive. Electronically signed by: Oneil Devonshire MD MD 12/01/2024 10:23 PM EST RP Workstation: [id number]   DG Chest 2 View Result Date: 12/01/2024 EXAM: 2 VIEW(S) XRAY OF THE CHEST 12/01/2024 04:24:00 PM COMPARISON: 10/03/2024 CLINICAL HISTORY: cough and COPD FINDINGS: LUNGS AND PLEURA: Background emphysema. Increased diffuse interstitial coarsening since prior radiograph. Findings may represent atypical infection. No pleural effusion. No pneumothorax. HEART AND MEDIASTINUM: No acute abnormality of the cardiac and mediastinal silhouettes. BONES AND SOFT TISSUES: No acute osseous abnormality. IMPRESSION: 1. Diffuse interstitial coarsening, which may represent atypical infection. Electronically signed by: Morgane Naveau MD MD 12/01/2024 05:34 PM EST RP Workstation: [id number]           LOS: 1 day   Time spent= 35 mins    Burgess JAYSON Dare, MD [facility]  If 7PM-7AM, please contact night-coverage  12/02/2024, 11:36 AM  "

## 2024-12-02 NOTE — Progress Notes (Signed)
 Mobility Specialist Progress Note:    12/02/24 1432  Mobility  Activity Ambulated with assistance  Level of Assistance Independent  Assistive Device None  Distance Ambulated (ft) 180 ft  Range of Motion/Exercises Active;All extremities  Activity Response Tolerated well  Mobility Referral Yes  Mobility visit 1 Mobility  Mobility Specialist Start Time (ACUTE ONLY) 1432  Mobility Specialist Stop Time (ACUTE ONLY) 1452  Mobility Specialist Time Calculation (min) (ACUTE ONLY) 20 min   Pt received in bed, agreeable to mobility. Independently able to stand and ambulate with no AD. Tolerated well, denies SOB. SpO2 91% on RA at rest, SpO2 88-92% on RA during ambulation. Returned supine, all needs met.   Jazalynn Mireles Mobility Specialist Please contact via Special Educational Needs Teacher or  Rehab office at [phone]

## 2024-12-02 NOTE — Inpatient Diabetes Management (Addendum)
 Inpatient Diabetes Program Recommendations  AACE/ADA: New Consensus Statement on Inpatient Glycemic Control (2015)  Target Ranges:  Prepandial:   less than 140 mg/dL      Peak postprandial:   less than 180 mg/dL (1-2 hours)      Critically ill patients:  140 - 180 mg/dL   Lab Results  Component Value Date   GLUCAP 177 (H) 10/03/2024   HGBA1C 7.9 (H) 07/17/2019    Review of Glycemic Control  Diabetes history: DM 2 Outpatient Diabetes medications: None Currently Current orders for Inpatient glycemic control:  Novolog  0-15 units tid + hs A1c in process Flu A+  Glucose on presentation 160/170 range pt then received 125 mg Solumedrol glucose now 425, was started on Novolog  sliding scale insulin . Solumedrol dose now at 40 mg Q12 hours.  Spoke with pt on the phone. Pt reports only being on metformin  in the past and was able to loose a lot of weight and keep it off. Pt follows strictly a diabetic diet low in carbohydrates and takes homeopathic supplements such as cinnamon for glucose control now. Pt has a meter but has not needed to check glucose trends lately as he has maintained glucose between 80-130.   Note pt was 170 on presentation pt reports eating a ham/egg/cheese biscuit prior to coming into the ED.  Discussed glucose trends in the hospital and explained that this maybe in part of the steroid medications he was getting. Pt reports he will be okay with insulin  or other medical therapies outpatient for a short time for glucose control while on steroids if discharged on them. Pt reports having metformin  at home.  Encouraged pt to get another recheck on A1c 3-4 months after glucose trends come back down to his baseline and to start checking his glucose regularly once discharged to make sure his trends are coming back down to his baseline/goal A1c of 7%.  Since pt on steroids would be difficult to dose outpt regimen especially if discharged on a steroid taper. Did explain the possibility  of being on once a day basal insulin  outpt for a short time to pt. Pt has metformin  at home. Pt would like to discuss with hospital MD.  Note: pt has not received Novolog  A1c not resulted. Need more information before dosing pt for glucose control outpt unless he is discharged on sliding scale....... I also do not see insurance listed for pt.  Thanks, Clotilda Bull RN, MSN, BC-ADM Inpatient Diabetes Coordinator Team Pager [phone] (8a-5p)

## 2024-12-02 NOTE — Progress Notes (Signed)
 SATURATION QUALIFICATIONS: (This note is used to comply with regulatory documentation for home oxygen)  Patient Saturations on Room Air at Rest = 91%  Patient Saturations on Room Air while Ambulating = 88%  Patient Saturations on 0 Liters of oxygen while Ambulating = 88-92%  Please briefly explain why patient needs home oxygen:

## 2024-12-03 LAB — BLOOD CULTURE ID PANEL (REFLEXED) - BCID2

## 2024-12-03 LAB — EXPECTORATED SPUTUM ASSESSMENT W GRAM STAIN, RFLX TO RESP C

## 2024-12-03 NOTE — Progress Notes (Signed)
" °   12/03/24 9257  Provider Notification  Provider Name/Title Dr. Bryn  Date Provider Notified 12/03/24  Time Provider Notified [phone]  Method of Notification Page  Notification Reason Critical Result  Test performed and critical result aerobic bottles- gram positive cocci  Date Critical Result Received 12/03/24  Time Critical Result Received 0743  Provider response Other (Comment) (awaiting orders)    "

## 2024-12-03 NOTE — Progress Notes (Addendum)
 Attempted to call patient x3 times, also attempted to call wife one time no answer to any calls. Calling patient to inform of positive blood cultures and recommendations made to return to ED. Dr. Bryn made aware.

## 2024-12-04 LAB — CULTURE, RESPIRATORY W GRAM STAIN: Culture: NORMAL

## 2024-12-04 LAB — CULTURE, BLOOD (ROUTINE X 2)
Culture  Setup Time: NO GROWTH
Special Requests: ADEQUATE

## 2024-12-04 LAB — LEGIONELLA PNEUMOPHILA SEROGP 1 UR AG: L. pneumophila Serogp 1 Ur Ag: NEGATIVE

## 2024-12-06 LAB — CULTURE, BLOOD (ROUTINE X 2)
Culture: NO GROWTH
Special Requests: ADEQUATE
